# Patient Record
Sex: Male | Born: 1945 | Race: White | Hispanic: No | Smoking: Current every day smoker
Health system: Southern US, Community
[De-identification: ages and names within clinical notes are randomized; demographics above are authoritative.]

## PROBLEM LIST (undated history)

## (undated) DIAGNOSIS — I426 Alcoholic cardiomyopathy: Secondary | ICD-10-CM

## (undated) DIAGNOSIS — I4891 Unspecified atrial fibrillation: Secondary | ICD-10-CM

## (undated) DIAGNOSIS — I2510 Atherosclerotic heart disease of native coronary artery without angina pectoris: Secondary | ICD-10-CM

## (undated) DIAGNOSIS — I10 Essential (primary) hypertension: Secondary | ICD-10-CM

## (undated) DIAGNOSIS — C189 Malignant neoplasm of colon, unspecified: Secondary | ICD-10-CM

## (undated) DIAGNOSIS — R0989 Other specified symptoms and signs involving the circulatory and respiratory systems: Secondary | ICD-10-CM

## (undated) DIAGNOSIS — F32A Depression, unspecified: Secondary | ICD-10-CM

## (undated) DIAGNOSIS — F1010 Alcohol abuse, uncomplicated: Secondary | ICD-10-CM

## (undated) DIAGNOSIS — E785 Hyperlipidemia, unspecified: Secondary | ICD-10-CM

## (undated) DIAGNOSIS — K649 Unspecified hemorrhoids: Secondary | ICD-10-CM

## (undated) DIAGNOSIS — F329 Major depressive disorder, single episode, unspecified: Secondary | ICD-10-CM

## (undated) DIAGNOSIS — G4700 Insomnia, unspecified: Secondary | ICD-10-CM

## (undated) DIAGNOSIS — I4819 Other persistent atrial fibrillation: Secondary | ICD-10-CM

## (undated) DIAGNOSIS — K759 Inflammatory liver disease, unspecified: Secondary | ICD-10-CM

## (undated) DIAGNOSIS — R5383 Other fatigue: Secondary | ICD-10-CM

## (undated) DIAGNOSIS — M25512 Pain in left shoulder: Secondary | ICD-10-CM

## (undated) DIAGNOSIS — F419 Anxiety disorder, unspecified: Secondary | ICD-10-CM

## (undated) DIAGNOSIS — F1011 Alcohol abuse, in remission: Secondary | ICD-10-CM

## (undated) HISTORY — DX: Other persistent atrial fibrillation: I48.19

## (undated) HISTORY — DX: Essential (primary) hypertension: I10

## (undated) HISTORY — PX: CARDIAC CATHETERIZATION: SHX172

## (undated) HISTORY — DX: Anxiety disorder, unspecified: F41.9

## (undated) HISTORY — DX: Pain in left shoulder: M25.512

## (undated) HISTORY — PX: MOUTH SURGERY: SHX715

## (undated) HISTORY — DX: Major depressive disorder, single episode, unspecified: F32.9

## (undated) HISTORY — DX: Depression, unspecified: F32.A

## (undated) HISTORY — DX: Alcohol abuse, in remission: F10.11

## (undated) HISTORY — DX: Insomnia, unspecified: G47.00

## (undated) HISTORY — PX: CORONARY ARTERY BYPASS GRAFT: SHX141

## (undated) HISTORY — DX: Other fatigue: R53.83

## (undated) HISTORY — DX: Unspecified atrial fibrillation: I48.91

## (undated) HISTORY — DX: Hyperlipidemia, unspecified: E78.5

## (undated) HISTORY — DX: Other specified symptoms and signs involving the circulatory and respiratory systems: R09.89

## (undated) HISTORY — DX: Malignant neoplasm of colon, unspecified: C18.9

---

## 2001-07-10 ENCOUNTER — Emergency Department (HOSPITAL_COMMUNITY): Admission: EM | Admit: 2001-07-10 | Discharge: 2001-07-11 | Payer: Self-pay | Admitting: Emergency Medicine

## 2007-04-22 ENCOUNTER — Emergency Department (HOSPITAL_COMMUNITY): Admission: EM | Admit: 2007-04-22 | Discharge: 2007-04-22 | Payer: Self-pay | Admitting: Emergency Medicine

## 2007-04-27 ENCOUNTER — Emergency Department (HOSPITAL_COMMUNITY): Admission: EM | Admit: 2007-04-27 | Discharge: 2007-04-27 | Payer: Self-pay | Admitting: Family Medicine

## 2008-06-15 ENCOUNTER — Inpatient Hospital Stay (HOSPITAL_COMMUNITY): Admission: RE | Admit: 2008-06-15 | Discharge: 2008-06-18 | Payer: Self-pay | Admitting: Psychiatry

## 2008-06-15 ENCOUNTER — Ambulatory Visit: Payer: Self-pay | Admitting: Psychiatry

## 2008-06-15 ENCOUNTER — Emergency Department (HOSPITAL_COMMUNITY): Admission: EM | Admit: 2008-06-15 | Discharge: 2008-06-15 | Payer: Self-pay | Admitting: Emergency Medicine

## 2008-06-25 ENCOUNTER — Inpatient Hospital Stay (HOSPITAL_COMMUNITY): Admission: EM | Admit: 2008-06-25 | Discharge: 2008-06-28 | Payer: Self-pay | Admitting: Emergency Medicine

## 2008-06-27 ENCOUNTER — Encounter (INDEPENDENT_AMBULATORY_CARE_PROVIDER_SITE_OTHER): Payer: Self-pay | Admitting: Internal Medicine

## 2008-08-02 ENCOUNTER — Ambulatory Visit: Payer: Self-pay | Admitting: Internal Medicine

## 2008-08-04 ENCOUNTER — Ambulatory Visit: Payer: Self-pay | Admitting: *Deleted

## 2008-09-07 ENCOUNTER — Ambulatory Visit: Payer: Self-pay | Admitting: Internal Medicine

## 2008-09-09 ENCOUNTER — Ambulatory Visit (HOSPITAL_COMMUNITY): Admission: RE | Admit: 2008-09-09 | Discharge: 2008-09-09 | Payer: Self-pay | Admitting: Internal Medicine

## 2008-09-13 ENCOUNTER — Ambulatory Visit: Payer: Self-pay | Admitting: Internal Medicine

## 2008-09-16 ENCOUNTER — Ambulatory Visit: Payer: Self-pay | Admitting: Internal Medicine

## 2008-09-20 ENCOUNTER — Ambulatory Visit: Payer: Self-pay | Admitting: Internal Medicine

## 2008-09-27 ENCOUNTER — Ambulatory Visit: Payer: Self-pay | Admitting: Internal Medicine

## 2008-10-04 ENCOUNTER — Ambulatory Visit: Payer: Self-pay | Admitting: Internal Medicine

## 2008-10-19 ENCOUNTER — Ambulatory Visit: Payer: Self-pay | Admitting: Internal Medicine

## 2008-11-09 ENCOUNTER — Ambulatory Visit: Payer: Self-pay | Admitting: Internal Medicine

## 2008-12-06 ENCOUNTER — Ambulatory Visit: Payer: Self-pay | Admitting: Cardiology

## 2008-12-06 ENCOUNTER — Ambulatory Visit: Payer: Self-pay | Admitting: Internal Medicine

## 2008-12-06 ENCOUNTER — Emergency Department (HOSPITAL_COMMUNITY): Admission: EM | Admit: 2008-12-06 | Discharge: 2008-12-06 | Payer: Self-pay | Admitting: Family Medicine

## 2008-12-06 ENCOUNTER — Encounter: Payer: Self-pay | Admitting: Internal Medicine

## 2008-12-06 ENCOUNTER — Inpatient Hospital Stay (HOSPITAL_COMMUNITY): Admission: EM | Admit: 2008-12-06 | Discharge: 2008-12-09 | Payer: Self-pay | Admitting: Emergency Medicine

## 2008-12-07 ENCOUNTER — Encounter: Payer: Self-pay | Admitting: Cardiology

## 2008-12-08 ENCOUNTER — Encounter: Payer: Self-pay | Admitting: Cardiology

## 2008-12-08 ENCOUNTER — Encounter: Payer: Self-pay | Admitting: Internal Medicine

## 2009-01-10 ENCOUNTER — Ambulatory Visit: Payer: Self-pay | Admitting: Internal Medicine

## 2009-01-10 LAB — CONVERTED CEMR LAB
BUN: 13 mg/dL (ref 6–23)
Calcium: 9.1 mg/dL (ref 8.4–10.5)
Chloride: 103 meq/L (ref 96–112)
Sodium: 139 meq/L (ref 135–145)

## 2009-01-17 ENCOUNTER — Emergency Department (HOSPITAL_COMMUNITY): Admission: EM | Admit: 2009-01-17 | Discharge: 2009-01-17 | Payer: Self-pay | Admitting: Family Medicine

## 2009-01-17 DIAGNOSIS — F329 Major depressive disorder, single episode, unspecified: Secondary | ICD-10-CM

## 2009-01-17 DIAGNOSIS — R079 Chest pain, unspecified: Secondary | ICD-10-CM | POA: Insufficient documentation

## 2009-01-17 DIAGNOSIS — F3289 Other specified depressive episodes: Secondary | ICD-10-CM | POA: Insufficient documentation

## 2009-01-17 DIAGNOSIS — I4891 Unspecified atrial fibrillation: Secondary | ICD-10-CM | POA: Insufficient documentation

## 2009-01-17 DIAGNOSIS — K649 Unspecified hemorrhoids: Secondary | ICD-10-CM

## 2009-01-17 DIAGNOSIS — B1910 Unspecified viral hepatitis B without hepatic coma: Secondary | ICD-10-CM | POA: Insufficient documentation

## 2009-01-17 HISTORY — DX: Unspecified hemorrhoids: K64.9

## 2009-01-18 ENCOUNTER — Ambulatory Visit: Payer: Self-pay | Admitting: Cardiology

## 2009-01-18 DIAGNOSIS — F17200 Nicotine dependence, unspecified, uncomplicated: Secondary | ICD-10-CM | POA: Insufficient documentation

## 2009-01-18 DIAGNOSIS — F1010 Alcohol abuse, uncomplicated: Secondary | ICD-10-CM | POA: Insufficient documentation

## 2009-01-18 HISTORY — DX: Alcohol abuse, uncomplicated: F10.10

## 2009-02-28 ENCOUNTER — Ambulatory Visit: Payer: Self-pay | Admitting: Internal Medicine

## 2009-03-02 ENCOUNTER — Ambulatory Visit (HOSPITAL_COMMUNITY): Admission: RE | Admit: 2009-03-02 | Discharge: 2009-03-02 | Payer: Self-pay | Admitting: Internal Medicine

## 2009-04-16 ENCOUNTER — Emergency Department (HOSPITAL_COMMUNITY): Admission: EM | Admit: 2009-04-16 | Discharge: 2009-04-16 | Payer: Self-pay | Admitting: Family Medicine

## 2009-04-24 ENCOUNTER — Ambulatory Visit: Payer: Self-pay | Admitting: Internal Medicine

## 2009-04-24 ENCOUNTER — Telehealth (INDEPENDENT_AMBULATORY_CARE_PROVIDER_SITE_OTHER): Payer: Self-pay | Admitting: *Deleted

## 2009-05-31 ENCOUNTER — Ambulatory Visit: Payer: Self-pay | Admitting: Internal Medicine

## 2009-05-31 LAB — CONVERTED CEMR LAB
Sed Rate: 2 mm/hr (ref 0–16)
Testosterone: 665.43 ng/dL (ref 350–890)

## 2009-06-07 ENCOUNTER — Ambulatory Visit: Payer: Self-pay | Admitting: Internal Medicine

## 2009-06-21 ENCOUNTER — Ambulatory Visit: Payer: Self-pay | Admitting: Internal Medicine

## 2009-06-21 ENCOUNTER — Encounter (INDEPENDENT_AMBULATORY_CARE_PROVIDER_SITE_OTHER): Payer: Self-pay | Admitting: Adult Health

## 2009-06-21 LAB — CONVERTED CEMR LAB
Basophils Absolute: 0 10*3/uL (ref 0.0–0.1)
Eosinophils Absolute: 0.2 10*3/uL (ref 0.0–0.7)
Hemoglobin: 16.2 g/dL (ref 13.0–17.0)
Lymphocytes Relative: 30 % (ref 12–46)
Lymphs Abs: 2.4 10*3/uL (ref 0.7–4.0)
MCV: 93.4 fL (ref 78.0–100.0)
Monocytes Absolute: 0.9 10*3/uL (ref 0.1–1.0)
Monocytes Relative: 11 % (ref 3–12)
Platelets: 194 10*3/uL (ref 150–400)
TSH: 1.545 microintl units/mL (ref 0.350–4.500)
WBC: 8 10*3/uL (ref 4.0–10.5)

## 2009-07-18 ENCOUNTER — Ambulatory Visit: Payer: Self-pay | Admitting: Cardiology

## 2009-08-21 ENCOUNTER — Ambulatory Visit: Payer: Self-pay | Admitting: Internal Medicine

## 2009-10-02 ENCOUNTER — Ambulatory Visit: Payer: Self-pay | Admitting: Family Medicine

## 2009-10-02 LAB — CONVERTED CEMR LAB
Basophils Absolute: 0 10*3/uL (ref 0.0–0.1)
CRP: 0.2 mg/dL (ref <0.6)
Calcium: 9.3 mg/dL (ref 8.4–10.5)
Eosinophils Absolute: 0.1 10*3/uL (ref 0.0–0.7)
Hemoglobin: 15.7 g/dL (ref 13.0–17.0)
MCHC: 33.6 g/dL (ref 30.0–36.0)
Monocytes Absolute: 0.7 10*3/uL (ref 0.1–1.0)
Platelets: 197 10*3/uL (ref 150–400)
Potassium: 5.2 meq/L (ref 3.5–5.3)
RBC: 5.06 M/uL (ref 4.22–5.81)
Total Bilirubin: 0.5 mg/dL (ref 0.3–1.2)
Total Protein: 7.1 g/dL (ref 6.0–8.3)

## 2009-10-04 ENCOUNTER — Ambulatory Visit (HOSPITAL_COMMUNITY): Admission: RE | Admit: 2009-10-04 | Discharge: 2009-10-04 | Payer: Self-pay | Admitting: Family Medicine

## 2009-10-31 ENCOUNTER — Emergency Department (HOSPITAL_COMMUNITY): Admission: EM | Admit: 2009-10-31 | Discharge: 2009-11-01 | Payer: Self-pay | Admitting: Emergency Medicine

## 2009-11-01 ENCOUNTER — Emergency Department (HOSPITAL_COMMUNITY): Admission: EM | Admit: 2009-11-01 | Discharge: 2009-11-01 | Payer: Self-pay | Admitting: Emergency Medicine

## 2009-11-03 ENCOUNTER — Emergency Department (HOSPITAL_COMMUNITY): Admission: EM | Admit: 2009-11-03 | Discharge: 2009-11-04 | Payer: Self-pay | Admitting: Emergency Medicine

## 2009-11-04 ENCOUNTER — Inpatient Hospital Stay (HOSPITAL_COMMUNITY): Admission: AD | Admit: 2009-11-04 | Discharge: 2009-11-05 | Payer: Self-pay | Admitting: Psychiatry

## 2009-11-05 ENCOUNTER — Ambulatory Visit: Payer: Self-pay | Admitting: Psychiatry

## 2009-12-02 ENCOUNTER — Emergency Department (HOSPITAL_COMMUNITY): Admission: EM | Admit: 2009-12-02 | Discharge: 2009-12-02 | Payer: Self-pay | Admitting: Emergency Medicine

## 2009-12-02 ENCOUNTER — Emergency Department (HOSPITAL_COMMUNITY): Admission: EM | Admit: 2009-12-02 | Discharge: 2009-12-03 | Payer: Self-pay | Admitting: Emergency Medicine

## 2009-12-02 ENCOUNTER — Ambulatory Visit: Payer: Self-pay | Admitting: Psychiatry

## 2010-02-15 ENCOUNTER — Inpatient Hospital Stay (HOSPITAL_COMMUNITY): Admission: RE | Admit: 2010-02-15 | Discharge: 2009-12-06 | Payer: Self-pay | Admitting: Psychiatry

## 2010-04-12 NOTE — Assessment & Plan Note (Signed)
Summary: F6M/DM   Visit Type:  Follow-up Primary Provider:  Dr Reche Dixon ([facility])  CC:  no complaints.  History of Present Illness: Pleasant gentleman  admitted to [facility] with atrial fibrillation and chest pain in September of 2010. He ruled out for myocardial infarction with serial enzymes. A Myoview showed inferior thinning but no ischemia and an ejection fraction of 61%. An echocardiogram in April showed normal LV function and mild right atrial/right ventricular enlargement. A TSH was normal. It was felt that he was not a Coumadin candidate given her history of EtOH abuse. He was discharged on rate control medications. I last saw him in November of 2010. Since then the patient denies any dyspnea on exertion, orthopnea, PND, pedal edema, palpitations, syncope or chest pain.   Current Medications (verified): 1)  Trazodone Hcl 100 Mg Tabs (Trazodone Hcl) .... Take One Tablet At Bedtime 2)  Aspirin 325 Mg Tabs (Aspirin) .... Take 1 Tablet By Mouth Once A Day 3)  Diltiazem Hcl Er Beads 360 Mg Xr24h-Cap (Diltiazem Hcl Er Beads) .... Take 1 Tablet By Mouth Once A Day 4)  Lanoxin 0.25 Mg Tabs (Digoxin) .... Take 1 Tablet Once Daily By Mouth  Allergies (verified): No Known Drug Allergies  Past History:  Past Medical History: DEPRESSION (ICD-311) HEMORRHOIDS (ICD-455.6) HEPATITIS B (ICD-070.30) ATRIAL FIBRILLATION (ICD-427.31) History of alcohol abuse  Social History: Reviewed history from 01/18/2009 and no changes required.  He lives in [place] with his wife and daughter.  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   He has 67 pack year history tobacco. History of EtOH abuse   Review of Systems       no fevers or chills, productive cough, hemoptysis, dysphasia, odynophagia, melena, hematochezia, dysuria, hematuria, rash, seizure activity, orthopnea, PND, pedal edema, claudication. Remaining systems are negative.   Vital  Signs:  Patient profile:   65 year old male Height:      78 inches Weight:      202 pounds BMI:     23.43 Pulse rate:   62 / minute BP sitting:   115 / 68  (left arm) Cuff size:   large  Vitals Entered By: Burnett Kanaris, CNA (Jul 18, 2009 8:26 AM)  Physical Exam  General:  Well-developed well-nourished in no acute distress.  Skin is warm and dry.  HEENT is normal.  Neck is supple. No thyromegaly.  Chest is clear to auscultation with normal expansion.  Cardiovascular exam is regular rate and rhythm.  Abdominal exam nontender or distended. No masses palpated. Extremities show no edema. neuro grossly intact    EKG  Procedure date:  07/18/2009  Findings:      Sinus rhythm at a rate of 61. Cannot rule out prior inferior infarct.  Impression & Recommendations:  Problem # 1:  ATRIAL FIBRILLATION (ICD-427.31) Patient remains in sinus rhythm. Continue Cardizem and Lanoxin. Continue aspirin. Patient has no embolic risk factors. Given history of EtOH he would not be a Coumadin candidate regardless. His updated medication list for this problem includes:    Aspirin 325 Mg Tabs (Aspirin) .Marland Kitchen... Take 1 tablet by mouth once a day    Lanoxin 0.25 Mg Tabs (Digoxin) .Marland Kitchen... Take 1 tablet once daily by mouth  Orders: EKG w/ Interpretation (93000)  Problem # 2:  TOBACCO ABUSE (ICD-305.1) Patient counseled on discontinuing.  Problem # 3:  ALCOHOL ABUSE (ICD-305.00) Patient counseled on avoiding.  Problem # 4:  HEPATITIS B (ICD-070.30) Management per  primary care.  Patient Instructions: 1)  Your physician recommends that you schedule a follow-up appointment in: 1 YEAR WITH DR. Bobette Leyh 2)  Your physician recommends that you continue on your current medications as directed. Please refer to the Current Medication list given to you today.

## 2010-04-12 NOTE — Progress Notes (Signed)
Summary: triage/hypotension/bradycardia/low temp.  Phone Note Call from Patient   Caller: Patient Reason for Call: Talk to Nurse Summary of Call: Patient states his temp was 95.7 this am...and has been around the same for a few days. His BP at home is 96/57 and pulse has been in the low 50's.Marland KitchenMarland KitchenHe was at seen at [facility] on 2/26 and has a history of Afib.Marland KitchenETOH abuse and Hep B..He states he feels very tired.Marland KitchenMarland KitchenHe was at [facility] for [facility]ough on 04/16/09.Marland KitchenMarland KitchenAppointment made for today..in open schedule... Initial call taken by: Conchita Paris,  April 24, 2009 10:32 AM

## 2010-05-24 LAB — RAPID URINE DRUG SCREEN, HOSP PERFORMED
Barbiturates: NOT DETECTED
Benzodiazepines: NOT DETECTED

## 2010-05-24 LAB — TROPONIN I: Troponin I: 0.01 ng/mL (ref 0.00–0.06)

## 2010-05-24 LAB — COMPREHENSIVE METABOLIC PANEL
ALT: 17 U/L (ref 0–53)
AST: 25 U/L (ref 0–37)
Albumin: 3.3 g/dL — ABNORMAL LOW (ref 3.5–5.2)
Alkaline Phosphatase: 63 U/L (ref 39–117)
BUN: 4 mg/dL — ABNORMAL LOW (ref 6–23)
CO2: 27 mEq/L (ref 19–32)
Chloride: 100 mEq/L (ref 96–112)
Creatinine, Ser: 0.77 mg/dL (ref 0.4–1.5)
GFR calc Af Amer: >60 mL/min (ref >60)
GFR calc non Af Amer: >60 mL/min (ref >60)
Glucose, Bld: 162 mg/dL — ABNORMAL HIGH (ref 70–99)
Potassium: 4.6 mEq/L (ref 3.5–5.1)
Sodium: 132 mEq/L — ABNORMAL LOW (ref 135–145)
Total Bilirubin: 0.4 mg/dL (ref 0.3–1.2)

## 2010-05-24 LAB — DIFFERENTIAL
Basophils Absolute: 0 10*3/uL (ref 0.0–0.1)
Basophils Relative: 0 % (ref 0–1)
Eosinophils Relative: 2 % (ref 0–5)
Monocytes Absolute: 0.9 10*3/uL (ref 0.1–1.0)
Monocytes Relative: 12 % (ref 3–12)
Neutro Abs: 3.8 10*3/uL (ref 1.7–7.7)

## 2010-05-24 LAB — BASIC METABOLIC PANEL
BUN: 5 mg/dL — ABNORMAL LOW (ref 6–23)
CO2: 24 mEq/L (ref 19–32)
GFR calc non Af Amer: >60 mL/min (ref >60)
Glucose, Bld: 88 mg/dL (ref 70–99)
Potassium: 4.2 mEq/L (ref 3.5–5.1)

## 2010-05-24 LAB — ETHANOL: Alcohol, Ethyl (B): 112 mg/dL — ABNORMAL HIGH (ref 0–10)

## 2010-05-24 LAB — DIGOXIN LEVEL: Digoxin Level: <0.2 ng/mL — ABNORMAL LOW (ref 0.8–2.0)

## 2010-05-24 LAB — CBC
HCT: 44.6 % (ref 39.0–52.0)
HCT: 45.2 % (ref 39.0–52.0)
Platelets: 181 10*3/uL (ref 150–400)
RBC: 4.86 MIL/uL (ref 4.22–5.81)

## 2010-05-24 LAB — CK TOTAL AND CKMB (NOT AT ARMC): CK, MB: 0.8 ng/mL (ref 0.3–4.0)

## 2010-05-25 LAB — ETHANOL: Alcohol, Ethyl (B): 96 mg/dL — ABNORMAL HIGH (ref 0–10)

## 2010-05-25 LAB — DIFFERENTIAL
Basophils Absolute: 0.1 10*3/uL (ref 0.0–0.1)
Basophils Relative: 1 % (ref 0–1)
Basophils Relative: 1 % (ref 0–1)
Eosinophils Absolute: 0 10*3/uL (ref 0.0–0.7)
Eosinophils Absolute: 0.1 10*3/uL (ref 0.0–0.7)
Eosinophils Relative: 0 % (ref 0–5)
Monocytes Absolute: 0.8 10*3/uL (ref 0.1–1.0)
Monocytes Relative: 7 % (ref 3–12)
Monocytes Relative: 7 % (ref 3–12)
Neutro Abs: 5.6 10*3/uL (ref 1.7–7.7)
Neutrophils Relative %: 59 % (ref 43–77)

## 2010-05-25 LAB — COMPREHENSIVE METABOLIC PANEL
ALT: 11 U/L (ref 0–53)
Alkaline Phosphatase: 86 U/L (ref 39–117)
BUN: 7 mg/dL (ref 6–23)
CO2: 20 mEq/L (ref 19–32)
Chloride: 101 mEq/L (ref 96–112)
GFR calc non Af Amer: >60 mL/min (ref >60)
Glucose, Bld: 79 mg/dL (ref 70–99)
Potassium: 3.4 mEq/L — ABNORMAL LOW (ref 3.5–5.1)
Sodium: 132 mEq/L — ABNORMAL LOW (ref 135–145)
Total Bilirubin: 0.7 mg/dL (ref 0.3–1.2)

## 2010-05-25 LAB — RAPID URINE DRUG SCREEN, HOSP PERFORMED
Amphetamines: NOT DETECTED
Amphetamines: NOT DETECTED
Amphetamines: NOT DETECTED
Barbiturates: NOT DETECTED
Barbiturates: NOT DETECTED
Benzodiazepines: NOT DETECTED
Cocaine: NOT DETECTED
Cocaine: NOT DETECTED
Opiates: NOT DETECTED

## 2010-05-25 LAB — BASIC METABOLIC PANEL
CO2: 25 mEq/L (ref 19–32)
Calcium: 9.2 mg/dL (ref 8.4–10.5)
Glucose, Bld: 85 mg/dL (ref 70–99)
Potassium: 4 mEq/L (ref 3.5–5.1)
Sodium: 134 mEq/L — ABNORMAL LOW (ref 135–145)

## 2010-05-25 LAB — HEPATIC FUNCTION PANEL
Bilirubin, Direct: 0.2 mg/dL (ref 0.0–0.3)
Total Bilirubin: 0.7 mg/dL (ref 0.3–1.2)

## 2010-05-25 LAB — DIGOXIN LEVEL
Digoxin Level: 0.3 ng/mL — ABNORMAL LOW (ref 0.8–2.0)
Digoxin Level: 1.9 ng/mL (ref 0.8–2.0)

## 2010-05-25 LAB — URINALYSIS, ROUTINE W REFLEX MICROSCOPIC
Bilirubin Urine: NEGATIVE
Hgb urine dipstick: NEGATIVE
Ketones, ur: NEGATIVE mg/dL
Nitrite: NEGATIVE
Specific Gravity, Urine: 1.015 (ref 1.005–1.030)
pH: 7.5 (ref 5.0–8.0)

## 2010-05-25 LAB — CBC
HCT: 42.1 % (ref 39.0–52.0)
HCT: 46.6 % (ref 39.0–52.0)
Hemoglobin: 14.7 g/dL (ref 13.0–17.0)
Hemoglobin: 16.2 g/dL (ref 13.0–17.0)
MCH: 32.1 pg (ref 26.0–34.0)
MCHC: 34.8 g/dL (ref 30.0–36.0)
MCV: 91.7 fL (ref 78.0–100.0)
WBC: 9.6 10*3/uL (ref 4.0–10.5)

## 2010-05-25 LAB — POCT CARDIAC MARKERS
Troponin i, poc: <0.05 ng/mL (ref 0.00–0.09)
Troponin i, poc: <0.05 ng/mL (ref 0.00–0.09)

## 2010-05-25 LAB — GLUCOSE, CAPILLARY: Glucose-Capillary: 97 mg/dL (ref 70–99)

## 2010-06-13 LAB — POCT URINALYSIS DIP (DEVICE)
Glucose, UA: NEGATIVE mg/dL
Ketones, ur: NEGATIVE mg/dL
Nitrite: NEGATIVE
Specific Gravity, Urine: >=1.03 (ref 1.005–1.030)

## 2010-06-14 LAB — BASIC METABOLIC PANEL
BUN: 11 mg/dL (ref 6–23)
Chloride: 104 mEq/L (ref 96–112)
Creatinine, Ser: 1.01 mg/dL (ref 0.4–1.5)
GFR calc non Af Amer: >60 mL/min (ref >60)
Glucose, Bld: 102 mg/dL — ABNORMAL HIGH (ref 70–99)
Potassium: 4 mEq/L (ref 3.5–5.1)

## 2010-06-14 LAB — CBC
HCT: 42.3 % (ref 39.0–52.0)
MCV: 93.1 fL (ref 78.0–100.0)
Platelets: 128 10*3/uL — ABNORMAL LOW (ref 150–400)
RDW: 13.3 % (ref 11.5–15.5)
WBC: 9.2 10*3/uL (ref 4.0–10.5)

## 2010-06-15 LAB — CBC
Hemoglobin: 16 g/dL (ref 13.0–17.0)
Hemoglobin: 17.5 g/dL — ABNORMAL HIGH (ref 13.0–17.0)
MCHC: 34 g/dL (ref 30.0–36.0)
MCHC: 34.4 g/dL (ref 30.0–36.0)
MCV: 92.5 fL (ref 78.0–100.0)
Platelets: 146 10*3/uL — ABNORMAL LOW (ref 150–400)
Platelets: 149 10*3/uL — ABNORMAL LOW (ref 150–400)
RBC: 5.56 MIL/uL (ref 4.22–5.81)
RDW: 13.1 % (ref 11.5–15.5)
RDW: 13.6 % (ref 11.5–15.5)

## 2010-06-15 LAB — CARDIAC PANEL(CRET KIN+CKTOT+MB+TROPI)
CK, MB: 0.8 ng/mL (ref 0.3–4.0)
Relative Index: INVALID (ref 0.0–2.5)
Relative Index: INVALID (ref 0.0–2.5)
Total CK: 69 U/L (ref 7–232)
Total CK: 70 U/L (ref 7–232)
Troponin I: 0.02 ng/mL (ref 0.00–0.06)

## 2010-06-15 LAB — BASIC METABOLIC PANEL
BUN: 10 mg/dL (ref 6–23)
CO2: 24 mEq/L (ref 19–32)
CO2: 26 mEq/L (ref 19–32)
CO2: 28 mEq/L (ref 19–32)
Calcium: 8.4 mg/dL (ref 8.4–10.5)
Chloride: 101 mEq/L (ref 96–112)
Creatinine, Ser: 1.02 mg/dL (ref 0.4–1.5)
GFR calc Af Amer: >60 mL/min (ref >60)
GFR calc Af Amer: >60 mL/min (ref >60)
GFR calc non Af Amer: >60 mL/min (ref >60)
GFR calc non Af Amer: >60 mL/min (ref >60)
Glucose, Bld: 102 mg/dL — ABNORMAL HIGH (ref 70–99)
Glucose, Bld: 109 mg/dL — ABNORMAL HIGH (ref 70–99)
Potassium: 3.7 mEq/L (ref 3.5–5.1)
Potassium: 4.4 mEq/L (ref 3.5–5.1)
Sodium: 135 mEq/L (ref 135–145)
Sodium: 136 mEq/L (ref 135–145)
Sodium: 136 mEq/L (ref 135–145)

## 2010-06-15 LAB — DIFFERENTIAL
Basophils Absolute: 0 10*3/uL (ref 0.0–0.1)
Basophils Relative: 0 % (ref 0–1)
Lymphocytes Relative: 40 % (ref 12–46)
Neutro Abs: 3.8 10*3/uL (ref 1.7–7.7)
Neutrophils Relative %: 47 % (ref 43–77)

## 2010-06-15 LAB — TROPONIN I: Troponin I: 0.02 ng/mL (ref 0.00–0.06)

## 2010-06-15 LAB — BRAIN NATRIURETIC PEPTIDE: Pro B Natriuretic peptide (BNP): 108 pg/mL — ABNORMAL HIGH (ref 0.0–100.0)

## 2010-06-15 LAB — CK TOTAL AND CKMB (NOT AT ARMC)
CK, MB: 1 ng/mL (ref 0.3–4.0)
Total CK: 68 U/L (ref 7–232)

## 2010-06-15 LAB — POCT CARDIAC MARKERS: Troponin i, poc: <0.05 ng/mL (ref 0.00–0.09)

## 2010-06-15 LAB — ETHANOL: Alcohol, Ethyl (B): 6 mg/dL (ref 0–10)

## 2010-06-20 LAB — COMPREHENSIVE METABOLIC PANEL
ALT: 12 U/L (ref 0–53)
Alkaline Phosphatase: 77 U/L (ref 39–117)
CO2: 22 mEq/L (ref 19–32)
Chloride: 105 mEq/L (ref 96–112)
GFR calc non Af Amer: >60 mL/min (ref >60)
Glucose, Bld: 88 mg/dL (ref 70–99)
Potassium: 3.6 mEq/L (ref 3.5–5.1)
Sodium: 138 mEq/L (ref 135–145)

## 2010-06-20 LAB — POCT CARDIAC MARKERS: Troponin i, poc: <0.05 ng/mL (ref 0.00–0.09)

## 2010-06-20 LAB — CARDIAC PANEL(CRET KIN+CKTOT+MB+TROPI)
CK, MB: 0.7 ng/mL (ref 0.3–4.0)
CK, MB: 1 ng/mL (ref 0.3–4.0)
Total CK: 78 U/L (ref 7–232)

## 2010-06-20 LAB — DIGOXIN LEVEL: Digoxin Level: 0.6 ng/mL — ABNORMAL LOW (ref 0.8–2.0)

## 2010-06-20 LAB — CBC
HCT: 39.3 % (ref 39.0–52.0)
HCT: 42.2 % (ref 39.0–52.0)
HCT: 43 % (ref 39.0–52.0)
Hemoglobin: 14.4 g/dL (ref 13.0–17.0)
Hemoglobin: 14.7 g/dL (ref 13.0–17.0)
MCV: 93.9 fL (ref 78.0–100.0)
Platelets: 119 10*3/uL — ABNORMAL LOW (ref 150–400)
Platelets: 152 10*3/uL (ref 150–400)
RBC: 4.17 MIL/uL — ABNORMAL LOW (ref 4.22–5.81)
RDW: 14.3 % (ref 11.5–15.5)
WBC: 5.4 10*3/uL (ref 4.0–10.5)
WBC: 7.1 10*3/uL (ref 4.0–10.5)
WBC: 9.3 10*3/uL (ref 4.0–10.5)

## 2010-06-20 LAB — BASIC METABOLIC PANEL
BUN: 7 mg/dL (ref 6–23)
BUN: 7 mg/dL (ref 6–23)
Chloride: 99 mEq/L (ref 96–112)
Creatinine, Ser: 0.85 mg/dL (ref 0.4–1.5)
GFR calc Af Amer: >60 mL/min (ref >60)
GFR calc non Af Amer: >60 mL/min (ref >60)
GFR calc non Af Amer: >60 mL/min (ref >60)
GFR calc non Af Amer: >60 mL/min (ref >60)
Glucose, Bld: 82 mg/dL (ref 70–99)
Potassium: 3.7 mEq/L (ref 3.5–5.1)
Potassium: 4.1 mEq/L (ref 3.5–5.1)
Potassium: 4.2 mEq/L (ref 3.5–5.1)
Sodium: 132 mEq/L — ABNORMAL LOW (ref 135–145)
Sodium: 138 mEq/L (ref 135–145)

## 2010-06-20 LAB — TSH: TSH: 3.316 u[IU]/mL (ref 0.350–4.500)

## 2010-06-20 LAB — PROTIME-INR
INR: 1.2 (ref 0.00–1.49)
Prothrombin Time: 15.4 seconds — ABNORMAL HIGH (ref 11.6–15.2)

## 2010-06-20 LAB — DIFFERENTIAL
Eosinophils Absolute: 0.1 10*3/uL (ref 0.0–0.7)
Eosinophils Relative: 1 % (ref 0–5)
Lymphocytes Relative: 51 % — ABNORMAL HIGH (ref 12–46)
Lymphs Abs: 4.8 10*3/uL — ABNORMAL HIGH (ref 0.7–4.0)
Monocytes Absolute: 0.7 10*3/uL (ref 0.1–1.0)

## 2010-06-20 LAB — RAPID URINE DRUG SCREEN, HOSP PERFORMED
Amphetamines: NOT DETECTED
Barbiturates: NOT DETECTED
Benzodiazepines: POSITIVE — AB
Cocaine: NOT DETECTED
Opiates: NOT DETECTED
Tetrahydrocannabinol: NOT DETECTED

## 2010-06-20 LAB — CK TOTAL AND CKMB (NOT AT ARMC)
CK, MB: 1.2 ng/mL (ref 0.3–4.0)
Relative Index: INVALID (ref 0.0–2.5)

## 2010-06-20 LAB — APTT: aPTT: 39 seconds — ABNORMAL HIGH (ref 24–37)

## 2010-07-24 NOTE — H&P (Signed)
NAME:  [name], [name] NO.:  [id number]   MEDICAL RECORD NO.:  [id number]          PATIENT TYPE:  IPS   LOCATION:  0501                          [facility]ACILITY[facility]  [facility]   PHYSICIAN:  Jasmine Pang, M.D. DATE OF BIRTH:  1945/12/31   DATE OF ADMISSION:  06/15/2008  DATE OF DISCHARGE:                       PSYCHIATRIC ADMISSION ASSESSMENT   This is a 65 year old male voluntarily admitted on June 15, 2008.   HISTORY OF PRESENT ILLNESS:  Patient presents with a history of alcohol  use stating that he began drinking heavily.  His drinking has been  escalating over the past couple weeks.  His last drink was on day prior  to this admission, drinking beer up to 12 or more.  He has been wanting  help, having problems with his wife.  He feels depressed but denies any  suicidal thoughts.  Reports he has not been sleeping for the past 4 to 5  days and his appetite has been satisfactory.   PAST PSYCHIATRIC HISTORY:  First admission to [facility].  Has been to detox programs before when [facility].   SOCIAL HISTORY:  A 66 year old male who is divorced.  He lives in  [place].  Has been living with his ex-wife but now with his drinking  has been living currently in as hotel.  Has a DUI in 2000 and states he  does have some charges with keeping a vehicle longer than he should and  is currently unemployed.   FAMILY HISTORY:  Father with alcohol problems.   ALCOHOL AND DRUG HISTORY:  Patient smokes.  Denies any blackouts or  seizure activity.  Denies any recreational drug use.   PRIMARY CARE Krystle Polcyn:  None.   MEDICAL PROBLEMS:  Denies any acute or chronic health issues.   MEDICATIONS:  None prior to arrival.   DRUG ALLERGIES:  NO KNOWN DRUG ALLERGIES.   PHYSICAL EXAM:  This is a tall, disheveled, middle-aged male with no  tremors and in no acute distress.  He was fully assessed at [facility]  Emergency  Department.  Physical exam was reviewed with no significant  findings.  Temperature of 97.7, 110 heart rate, 18 respirations, blood  pressure is 117/88, 6 feet 6 inches tall, 214 pounds.   LABORATORY DATA:  Shows an alcohol level of 194.  CMET within normal  limits.  Chest x-ray shows no acute changes.   MENTAL STATUS EXAM:  He is fully alert and cooperative with fair eye  contact, casually dressed.  Speech is soft spoken.  Mood is depressed.  Patient also has his eyes down.  He appears flat and depressed.  Thought  processes are coherent and goal directed.  No evidence of any psychotic  statements.  Cognitive function intact.  Memory appears intact.  Judgment and insight are good.   AXIS I:  1. Alcohol dependence.  2. Substance-induced mood disorder.  AXIS II:  Deferred.  AXIS III:  No known medical conditions.  AXIS IV:  Problems with primary support group, possible problems with  housing, and  other psychosocial problems, problems related to legal  system.  AXIS V:  Current is 45.   PLAN:  Contract for safety.  We will have Librium available on a p.r.n.  basis.  We will monitor signs and symptoms of withdrawal, work on  relapse prevention.  Patient may benefit from an antidepressant.  We  will continue to assess other comorbidities, offer a family session with  his wife, and case manager will assess his living situation.   TENTATIVE LENGTH OF STAY:  Three to 5 days.      Landry Corporal, N.P.      Jasmine Pang, M.D.  Electronically Signed    JO/MEDQ  D:  06/17/2008  T:  06/17/2008  Job:  [id number]

## 2010-07-24 NOTE — H&P (Signed)
NAME:  [name], [name] NO.:  [id number]   MEDICAL RECORD NO.:  [id number]          PATIENT TYPE:  INP   LOCATION:  1237                         FACILITY:  [facility]   PHYSICIAN:  Della Goo, M.D. DATE OF BIRTH:  [birth year]   DATE OF ADMISSION:  06/25/2008  DATE OF DISCHARGE:                              HISTORY & PHYSICAL   PRIMARY CARE PHYSICIAN:  None.   CHIEF COMPLAINT:  Wishes to go to alcohol detox program.   HISTORY OF PRESENT ILLNESS:  This is a 65 year old male who presents to  the emergency department with complaints of drinking too much and  desiring to go to an alcohol detox program.  The patient states that he  drinks a lot.  He states that he drank more than a 12-pack prior to  coming in this evening.  His alcohol level was found to be 274.  The  patient was referred for medical admission secondary to complaints of  having shortness of breath and chest pain as well.  He states that he  has had chest pain off and on in the right lateral side of his chest for  the past month.  He also reports having shortness of breath.  He denies  having any nausea, vomiting or diaphoresis associated with this  discomfort.  He denies that this discomfort is related to exertion.  He  states it happens on occasion.  The patient is a smoker as well.  He  states he smokes one and a half packs of cigarettes daily for 45 years,  and he denies having any family history of heart disease that he knows  of.   In the emergency department, the patient was evaluated and found to be  in atrial fibrillation which appeared to be rate controlled.  The  patient was asked further if he had been having palpitations and had  noticed that his heart would race off and on, and he stated that he had,  and this has been going on for many months.   PAST MEDICAL HISTORY:  The patient denies any previous medical problems.   PAST SURGICAL HISTORY:  None.   MEDICATIONS:  None.   ALLERGIES:  NO KNOWN DRUG ALLERGIES.   SOCIAL HISTORY:  The patient is a smoker, smokes one and a half packs of  cigarettes daily for 45 years.  He also is a heavy drinker and would not  state how long he had been drinking heavily.   FAMILY HISTORY:  Negative for coronary artery disease, hypertension,  diabetes and cancer.   REVIEW OF SYSTEMS:  Pertinents are mentioned above in HPI.  All other  systems are negative.  The patient denies having syncope or seizures.   PHYSICAL EXAMINATION FINDINGS:  GENERAL:  This is a 65 year old  pleasant, mildly intoxicated male in discomfort but no acute distress.  VITAL SIGNS:  Temperature 97.9, blood pressure 107/70, heart rate 80,  respirations 20, O2 saturations 100% on 2 liters nasal cannula oxygen.  HEENT:  Normocephalic, atraumatic.  Pupils equally round reactive to  light.  Extraocular movements are intact.  Funduscopic  benign.  Oropharynx is clear.  NECK:  Supple, full range of motion.  No thyromegaly, adenopathy,  jugular venous distention.  CARDIOVASCULAR:  Regular rate and rhythm.  No murmurs, gallops or rubs.  LUNGS:  Clear to auscultation bilaterally.  ABDOMEN:  Positive bowel sounds, soft, nontender, nondistended.  EXTREMITIES: Without cyanosis, clubbing or edema.  NEUROLOGIC:  The patient is alert and oriented x3.  Cranial nerves are  intact.  Motor and sensory function also intact.  There are no focal  deficits on examination.   LABORATORY STUDIES:  White blood cell count 9.3, hemoglobin 14.7,  hematocrit 43.0, platelets 152, neutrophils 39%, lymphocytes 51%,  alcohol level 274.  Sodium 132, potassium 4.1, chloride 99, carbon  dioxide 25, BUN 7, creatinine 0.92 and glucose 82.  Urine drug screen  positive for benzodiazepines.  Point of care cardiac markers with a  myoglobin of 65.7, CK-MB less than 1.0, troponin less than 0.05.  EKG  reveals an atrial fibrillation initially with a rate of 102 with mild  rapid ventricular  response.  The patient received a bolus of Cardizem  times one dose and the heart rate did decrease to 86.  The patient also  became hypotensive at that time and received volume resuscitation.  A CT  scan of the chest with angiogram was performed, results of which were  negative for evidence of pulmonary emboli.  Mild cardiomegaly and  coronary artery disease changes were present, and emphysema changes were  seen.   ASSESSMENT:  A 65 year old male being admitted with:  1. Atrial fibrillation with episodes of RVR.  2. Hypotension secondary to medication.  3. Chest pain and shortness of breath.  4. Alcohol intoxication.  5. Tobacco abuse.  6. Alcohol abuse.   PLAN:  The patient will be admitted to a step-down ICU area.  The  patient will be placed on digoxin therapy to help rate control his  atrial fibrillation.  Cardiac enzymes will be ordered as well and a TSH  level.  The patient will also be placed on the IV Ativan protocol for  alcohol withdrawal.  Volume resuscitation has been ordered.  A nicotine  patch has also been ordered for tobacco withdrawal.  The patient will be  placed on GI prophylaxis.  Further workup will ensue pending results of  the patient's clinical studies and his clinical course.  Once the  patient is medically cleared alcohol detox and Behavioral Health  consultations will be considered.      Della Goo, M.D.  Electronically Signed     HJ/MEDQ  D:  06/25/2008  T:  06/26/2008  Job:  [id number]

## 2010-07-24 NOTE — Consult Note (Signed)
NAME:  [name] NO.:  [id number]   MEDICAL RECORD NO.:  [id number]          PATIENT TYPE:  INP   LOCATION:  1403                         [facility]ACILITY:  [facility]   PHYSICIAN:  Antonietta Breach, M.D.  DATE OF BIRTH:  09/04/1945   DATE OF CONSULTATION:  06/28/2008  DATE OF DISCHARGE:  06/28/2008                                 CONSULTATION   REQUESTING PHYSICIAN:  [facility].   REASON FOR CONSULTATION:  Alcohol abuse.   HISTORY OF PRESENT ILLNESS:  [name] is a 65 year old male  admitted to the [facility] on June 24, 2008 due to atrial  fibrillation and alcohol abuse.  He does state that he was initially  having depressed mood over a breakup with his ex-wife; they had gotten  back to gather and then had broken up once again.   However, after spending time in the hospital he has resolved about their  break-up.  He does have constructive future goals and interests.  He has  no thoughts of harming himself or others.   His orientation and memory function are intact.  He does not have any  hallucinations or delusions.  He is socially appropriate and  cooperative.   He has been drinking alcohol heavily as an outpatient.   PAST PSYCHIATRIC HISTORY:  [name] has been drinking heavily for  years.  He was drinking a 12-pack of beer per day for a 2-week period;  however, this was interrupted with a recent admission to [facility] for 3 days.  He then relapsed approximately one week  ago, and drank 12 beers each day at that point.   When he was read[facility] he was placed on and  Ativan withdrawal protocol, which is almost currently finished   He has no history of suicide attempts.  He has no history of elevated  mood, elevated energy or decreased need for sleep.  He has no other  psychiatric admissions.   FAMILY PSYCHIATRIC HISTORY:  None.   SOCIAL HISTORY:  Divorced.  Religion:  [employer].   PAST MEDICAL HISTORY:  Atrial fibrillation.   Medications, Allergies and laboratory data reviewed.   REVIEW OF SYSTEMS:  Noncontributory.   MENTAL STATUS EXAM:  [name] is alert.  He is oriented to all spheres.  His eye contact is good.  His attention span is normal.  His  concentration is normal.  His affect is broad and appropriate.  Mood is  within normal limits.  His memory is intact to immediate, recent and  remote.  Fund of knowledge and intelligence are within normal limits.  Speech involves normal rate and prosody, without dysarthria.  Thought  process is logical, coherent and goal-directed.  No looseness of  associations.  Thought content:  No thoughts of harming himself or  others.  No delusions or hallucinations.  Insight is intact.  Judgment  is intact.   ASSESSMENT:  AXIS I:  Alcohol dependence with physiologic dependence,  now resolved.  AXIS II:  Deferred.  AXIS III:  See past medical history.  AXIS IV:  Economic.  General medical.  AXIS V:  55.   [name] is not at risk to harm himself or others.  He agrees to call  emergency services immediately for any thoughts of harming himself,  thoughts of harming others or distress.   The undersigned provided ego supportive psychotherapy and education.   RECOMMENDATIONS:  1. Would ask the social worker to help [name] find a shelter.  2. Would also provide information and appointment to [facility] for      substance rehabilitation.  3.  Would also recommend 12-step groups      and contact with sponsor.      Antonietta Breach, M.D.  Electronically Signed     JW/MEDQ  D:  07/10/2008  T:  07/10/2008  Job:  [id number]

## 2010-07-24 NOTE — Discharge Summary (Signed)
NAME:  [name], [name] NO.:  [id number]   MEDICAL RECORD NO.:  [id number]          PATIENT TYPE:  INP   LOCATION:  1403                         FACILITY:  [facility]   PHYSICIAN:  Eduard Clos, MDDATE OF BIRTH:  05-20-1945   DATE OF ADMISSION:  06/24/2008  DATE OF DISCHARGE:  06/28/2008                               DISCHARGE SUMMARY   PRIMARY CARE PHYSICIAN:  Unassigned, but the patient is supposed to  follow with [facility] with Dr. Reche Dixon.   HOSPITAL COURSE:  A 65 year old male with history of chronic alcoholism,  was found to be in atrial fibrillation with rapid ventricular rate.  Was  admitted to ICU and started on digoxin as patient was mildly  hypotensive.  Eventually he was started on IV amiodarone.  He also was  complaining of some chest pain, but with heart rate , his chest pain had  resolved.  Cardiac enzymes were negative.  Amiodarone IV and digoxin  were discontinued.  The patient was placed on Cardizem p.o. and  transferred to telemetry floor.  A 2-D echo was done.  The 2-D echo  showed EF of 55-60% and systolic function was normal.  I did discuss  with Dr. Anne Fu about the 2-D echo finding.  We also got a psychiatric  consult because of the patient's chronic alcoholism, who advised patient  outpatient therapy.  At this time, social work has already set up that  as the patient is ready and enthusiastic to follow up with outpatient  therapy.  We also set up [facility], follow up with Dr.  Reche Dixon as primary care for which patient is very eager to follow up  with.  At this time, the patient is not started on any Coumadin as  patient has history of chronic alcoholism with the risk of falls and  noncompliance.  The patient will be discharged on aspirin.  I advised  the patient that he may need Coumadin chronic therapy once he follows up  with outpatient clinic and stopping him drinking alcohol as it increases  for falls.  The patient is  agreeing to follow the advise.   PROCEDURE:  1. A 2-D echo on June 27, 2008 showed EF of 55-60%.  Systolic      function was normal.  cavity slightly dilated.  Also right atrium      slightly dilated.  Repeat 2-D echo is recommended in 1 year.  2. CT angio chest done on June 25, 2008 showed no evidence of      pulmonary embolic, mild cardiomegaly, coronary artery disease,      emphysema.   LABORATORY DATA:  Hemoglobin and hematocrit were 14.4 and 22.2.  Troponins are all negative.   FINAL DIAGNOSES:  1. Atrial fibrillation with rapid ventricular rate.  2. Chronic alcoholism.   DISCHARGE MEDICATIONS:  1. Diltiazem 60 mg p.o. 4 times daily.  2. Aspirin 325 mg p.o. daily.   PLAN:  1. The patient was to repeat a 2-D echo in 1 year.  2. Follow up with Dr. Reche Dixon as scheduled on Aug 02, 2008 at  2:15      p.m., Tuesday.  At that time, to discuss with Dr. Reche Dixon and find      if patient is willing to be able for taking Coumadin.  3. Advised strongly to quit drinking alcohol.  4. The patient has also been referred as outpatient for further      management of his chronic alcoholism which the patient is very      interested in to follow.  5. The patient is to be on a cardiac-healthy diet.      Eduard Clos, MD  Electronically Signed     ANK/MEDQ  D:  06/28/2008  T:  06/28/2008  Job:  [id number]   cc:   Reche Dixon, MD

## 2010-07-27 NOTE — Discharge Summary (Signed)
NAME:  [name], [name] NO.:  [id number]   MEDICAL RECORD NO.:  [id number]          PATIENT TYPE:  IPS   LOCATION:  0501                          FACILITY:  [facility]   PHYSICIAN:  Jasmine Pang, M.D. DATE OF BIRTH:  1945/10/05   DATE OF ADMISSION:  06/15/2008  DATE OF DISCHARGE:  06/18/2008                               DISCHARGE SUMMARY   IDENTIFICATION:  This is a 65 year old divorced male, who was admitted  on a voluntary basis on June 15, 2008.   HISTORY OF PRESENT ILLNESS:  The patient presents with a history of  alcohol use.  He states he began drinking heavily.  His drinking has  been escalating over the past couple of weeks.  His last drink was on  the day prior to this admission, drinking up to 12 or more beers.  He  has been wanting help.  He also states having problems with his wife.  He feels depressed, but denies any suicidal thoughts.  He has not been  sleeping for the past 4-5 days and his appetite has been satisfactory.  For further admission information, see psychiatric admission assessment.   PHYSICAL FINDINGS:  The patient was fully assessed in the [facility]  Emergency Department.  There were no acute physical or medical problems  noted.   LABORATORY DATA:  The alcohol level was 194.  CMET was within normal  limits.  Chest x-ray revealed no acute changes.   HOSPITAL COURSE:  Upon admission, the patient was started on trazodone  50 mg p.o. q.h.s. p.r.n. insomnia and Librium 25 mg p.o. q.4 h. p.r.n.  anxiety or withdrawal symptoms.  He was also started on 21 mg of  nicotine patch.  In individual sessions, the patient was depressed and  anxious.  He states he has been drinking for the past 2 weeks.  He  drinks every couple of days until the point of the intoxication.  He  drinks about 24 beers with liquor as well.  However, he had not had  drank for 2 days prior to this admission.  On June 17, 2008, case  manager spoke with the patient's ex-wife.   The patient and ex-wife have  been living together for the last 18 months.  She is concerned about his  drinking.  He is on probation for taking her car and did not report to  his probation officer as he was supposed to.  She stated that there is a  warrant out for his arrest at this point.  He was encouraged to think  about an [employer].  He states he does not want to do this.  He was  staying in hotel with friends.  On June 17, 2008, mood was less  depressed.  He was having no side effects to his Librium detox.  He was  having no signs of withdrawal.  Sleep was fair.  Appetite was good.  His  trazodone was increased to 100 mg p.o. q.h.s. with that may repeat x1.  On June 18, 2008, mental status had improved.  The patient was less  depressed, less anxious.  There was no suicidal or homicidal ideation.  No thoughts of self-injurious behavior.  No auditory or visual  hallucinations.  No paranoia or delusions.  Thoughts were logical and  goal-directed.  Thought content, no predominant theme.  Cognitive was  grossly intact.  Insight was fair.  Judgment fair.  Impulse control  fair.  It was felt the patient was safe for discharge today and he  wanted to leave the hospital.   DISCHARGE DIAGNOSES:  Axis I: Alcohol dependence, substance-induced mood  disorder.  Axis II:  None.  Axis III:  None.  Axis IV:  (Problems with primary support group, possible problems with  housing and other psychosocial problems, problems related to legal  system) Severe.  Axis V:  Global assessment of functioning was 55 upon discharge.  Global  assessment of functioning was 45 upon admission.  Global assessment of  functioning highest past year was 65-70.   DISCHARGE PLANS:  There were no dietary or activity level restrictions.   POSTHOSPITAL CARE PLANS:  The patient will go to the [facility] on  June 28, 2008, at 1 p.m.  He will also go to [facility] for  substance abuse assessment on June 21, 2008, at 1 p.m.  He is also to  attend AA meetings daily in the 90/90 model.   DISCHARGE MEDICATIONS:  1. Librium 25 mg take 1 this p.m. and 1 tomorrow and then he will be      finished with his detox.  2. Trazodone 100 mg p.o. q.h.s.      Jasmine Pang, M.D.  Electronically Signed     BHS/MEDQ  D:  07/03/2008  T:  07/04/2008  Job:  [id number]

## 2011-01-03 ENCOUNTER — Emergency Department (HOSPITAL_COMMUNITY): Payer: Self-pay

## 2011-01-03 ENCOUNTER — Observation Stay (HOSPITAL_COMMUNITY)
Admission: EM | Admit: 2011-01-03 | Discharge: 2011-01-04 | Disposition: A | Discharge status: Home / Self Care | Payer: Self-pay | Attending: Internal Medicine | Admitting: Internal Medicine

## 2011-01-03 DIAGNOSIS — F17200 Nicotine dependence, unspecified, uncomplicated: Secondary | ICD-10-CM | POA: Insufficient documentation

## 2011-01-03 DIAGNOSIS — F3289 Other specified depressive episodes: Secondary | ICD-10-CM | POA: Insufficient documentation

## 2011-01-03 DIAGNOSIS — R079 Chest pain, unspecified: Principal | ICD-10-CM | POA: Insufficient documentation

## 2011-01-03 DIAGNOSIS — R911 Solitary pulmonary nodule: Secondary | ICD-10-CM | POA: Insufficient documentation

## 2011-01-03 DIAGNOSIS — F1010 Alcohol abuse, uncomplicated: Secondary | ICD-10-CM | POA: Insufficient documentation

## 2011-01-03 DIAGNOSIS — F329 Major depressive disorder, single episode, unspecified: Secondary | ICD-10-CM | POA: Insufficient documentation

## 2011-01-03 DIAGNOSIS — G4700 Insomnia, unspecified: Secondary | ICD-10-CM | POA: Insufficient documentation

## 2011-01-03 DIAGNOSIS — I4891 Unspecified atrial fibrillation: Secondary | ICD-10-CM | POA: Insufficient documentation

## 2011-01-03 LAB — CBC
HCT: 40.5 % (ref 39.0–52.0)
HCT: 38.5 % — ABNORMAL LOW (ref 39.0–52.0)
Hemoglobin: 13.9 g/dL (ref 13.0–17.0)
Hemoglobin: 13.2 g/dL (ref 13.0–17.0)
MCH: 32.2 pg (ref 26.0–34.0)
MCHC: 34.3 g/dL (ref 30.0–36.0)
MCHC: 34.3 g/dL (ref 30.0–36.0)
MCV: 93.9 fL (ref 78.0–100.0)
RDW: 13.7 % (ref 11.5–15.5)

## 2011-01-03 LAB — URINALYSIS, ROUTINE W REFLEX MICROSCOPIC
Leukocytes, UA: NEGATIVE
Nitrite: NEGATIVE
Specific Gravity, Urine: 1.026 (ref 1.005–1.030)
pH: 5.5 (ref 5.0–8.0)

## 2011-01-03 LAB — POCT I-STAT TROPONIN I
Troponin i, poc: 0.01 ng/mL (ref 0.00–0.08)
Troponin i, poc: 0.01 ng/mL (ref 0.00–0.08)

## 2011-01-03 LAB — DIFFERENTIAL
Basophils Absolute: 0 10*3/uL (ref 0.0–0.1)
Basophils Relative: 0 % (ref 0–1)
Eosinophils Absolute: 0.2 10*3/uL (ref 0.0–0.7)
Lymphocytes Relative: 35 % (ref 12–46)
Monocytes Absolute: 0.8 10*3/uL (ref 0.1–1.0)
Monocytes Relative: 10 % (ref 3–12)
Neutro Abs: 4.4 10*3/uL (ref 1.7–7.7)
Neutrophils Relative %: 49 % (ref 43–77)

## 2011-01-03 LAB — BASIC METABOLIC PANEL
BUN: 19 mg/dL (ref 6–23)
BUN: 17 mg/dL (ref 6–23)
CO2: 27 mEq/L (ref 19–32)
Chloride: 106 mEq/L (ref 96–112)
Chloride: 105 mEq/L (ref 96–112)
Creatinine, Ser: 1.06 mg/dL (ref 0.50–1.35)
GFR calc Af Amer: 87 mL/min — ABNORMAL LOW (ref >90)
Potassium: 4 mEq/L (ref 3.5–5.1)
Potassium: 3.6 mEq/L (ref 3.5–5.1)
Sodium: 140 mEq/L (ref 135–145)

## 2011-01-03 LAB — CK TOTAL AND CKMB (NOT AT ARMC): Relative Index: INVALID (ref 0.0–2.5)

## 2011-01-03 LAB — APTT: aPTT: 30 seconds (ref 24–37)

## 2011-01-03 LAB — RAPID URINE DRUG SCREEN, HOSP PERFORMED
Amphetamines: NOT DETECTED
Benzodiazepines: NOT DETECTED
Opiates: NOT DETECTED

## 2011-01-03 LAB — PROTIME-INR: INR: 1.12 (ref 0.00–1.49)

## 2011-01-03 LAB — TROPONIN I: Troponin I: <0.3 ng/mL (ref <0.30)

## 2011-01-03 MED ORDER — IOHEXOL 300 MG/ML  SOLN
80.0000 mL | Freq: Once | INTRAMUSCULAR | Status: AC | PRN
  Administered 2011-01-03: 80 mL via INTRAVENOUS

## 2011-01-04 LAB — CBC
MCH: 32.2 pg (ref 26.0–34.0)
MCV: 93.8 fL (ref 78.0–100.0)
Platelets: 138 10*3/uL — ABNORMAL LOW (ref 150–400)
RBC: 4.5 MIL/uL (ref 4.22–5.81)
RDW: 13.5 % (ref 11.5–15.5)

## 2011-01-04 LAB — COMPREHENSIVE METABOLIC PANEL
ALT: 14 U/L (ref 0–53)
AST: 16 U/L (ref 0–37)
Albumin: 3.2 g/dL — ABNORMAL LOW (ref 3.5–5.2)
CO2: 28 mEq/L (ref 19–32)
Calcium: 8.9 mg/dL (ref 8.4–10.5)
Creatinine, Ser: 1 mg/dL (ref 0.50–1.35)
Sodium: 142 mEq/L (ref 135–145)
Total Protein: 5.8 g/dL — ABNORMAL LOW (ref 6.0–8.3)

## 2011-01-04 LAB — CARDIAC PANEL(CRET KIN+CKTOT+MB+TROPI)
Relative Index: INVALID (ref 0.0–2.5)
Relative Index: INVALID (ref 0.0–2.5)
Total CK: 37 U/L (ref 7–232)
Total CK: 44 U/L (ref 7–232)
Troponin I: <0.3 ng/mL (ref <0.30)

## 2011-01-04 LAB — LIPID PANEL
HDL: 41 mg/dL (ref >39)
LDL Cholesterol: 71 mg/dL (ref 0–99)
Total CHOL/HDL Ratio: 3.1 RATIO
Triglycerides: 86 mg/dL (ref <150)
VLDL: 17 mg/dL (ref 0–40)

## 2011-01-04 LAB — TSH: TSH: 2.059 u[IU]/mL (ref 0.350–4.500)

## 2011-01-04 LAB — HEMOGLOBIN A1C: Hgb A1c MFr Bld: 5.3 % (ref <5.7)

## 2011-01-08 NOTE — H&P (Signed)
NAME:  [name], [name] NO.:  [id number]  MEDICAL RECORD NO.:  [id number]  LOCATION:  [facility]                         FACILITY:  [facility]  PHYSICIAN:  Peggye Pitt, M.D. DATE OF BIRTH:  04/09/1945  DATE OF ADMISSION:  01/03/2011 DATE OF DISCHARGE:                             HISTORY & PHYSICAL   PRIMARY CARE PHYSICIAN:  Dineen Kid. Reche Dixon, MD at [facility].  COMPLAINT:  Left-sided chest pain.  HISTORY OF PRESENT ILLNESS:  Mr. Durden is a 65 year old Caucasian gentleman with a history of tobacco and alcohol abuse as well as atrial fibrillation.  Per his report, it appears that during her recent hospitalization at [facility], he had what sounds to be a TEE followed by a DC cardioversion for his atrial fibrillation.  He has been deemed a non Coumadin candidate secondary to his continues alcohol abuse.  He presents to the hospital today, complaining of a 3-4 day history of left- sided chest pain.  This is not exertional in nature.  He denies any shortness of breath, diaphoresis, nausea, or vomiting.  ALLERGIES:  He has no known drug allergies.  PAST MEDICAL HISTORY:  Significant for; 1. Atrial fibrillation. 2. Alcohol abuse. 3. Tobacco abuse. 4. Depression with prior suicidal ideation and has required 2 stays at     [facility].  HOME MEDICATIONS:  She is currently not on medications, but apparently following his discharge by [facility] in [place].  He was supposed to be on; 1. Diltiazem. 2. Ativan. 3. Trazodone.  SOCIAL HISTORY:  He smokes about a pack a day for over 45 years.  Admits to drinking anywhere from 6-18 beers a day; although, he states it has been a couple weeks, since he last had any alcohol.  He denies any illicit drug use.  FAMILY HISTORY:  Nonsignificant for heart disease, cancer, and strokes.  REVIEW OF SYSTEMS:  Negative except as mentioned in history of present illness.  PHYSICAL EXAMINATION:  VITAL SIGNS:   On admission, blood pressure 100/63, heart rate 83, respirations 16, temp of 98.0, and sats 96% on room air. GENERAL:  He is alert, awake, and oriented x3, in no current distress. HEENT:  Normocephalic and atraumatic.  Pupils are equal, round, and reactive to light.  He has intact extraocular movements.  He wears corrective lenses. NECK:  Supple.  No JVD.  No lymphadenopathy.  No bruits.  No goiter. HEART:  Irregularly irregular.  I cannot auscultate any murmurs, rubs, or gallops. LUNGS:  Clear to auscultation bilaterally. ABDOMEN:  Soft, nontender, and nondistended with positive bowel sounds. EXTREMITIES:  He has no clubbing, cyanosis, or edema.  Positive pedal pulses. NEUROLOGIC:  Grossly intact and nonfocal.  LABORATORY DATA:  On admission, sodium 140, potassium 4.0, chloride 106, bicarb 25, BUN 19, creatinine 1.03, and glucose 74.  WBCs 8.3, hemoglobin 13.9, and platelets are 147.  Urinalysis is negative.  An INR is 1.12.  First troponin is negative at 0.01.  Chest x-ray shows an ill- defined nodular density in the left mid lung, that was not present on CT in 2010.  An EKG that shows atrial fibrillation of a rate of around 84 beats per minute.  He  has normal axis.  I do not appreciate any ST or T- wave changes.  ASSESSMENT AND PLAN: 1. Chest pain.  At this point, we will admit him to the hospital for     an acute coronary syndrome.  Rule out by the way of EKGs and     cardiac enzymes.  From what he tells me, it appears that he had a     recent stress test during this hospitalization at [facility].  I will request records.  According to records from our     hospital, he had a stress test in September 2010, that showed an     ejection fraction of 60% with no signs for inducible ischemia.  If     he rules out and he indeed had a negative stress test recently,     then I believe no further cardiac workup is warranted at this time. 2. Atrial fibrillation.  I will  restart him on rate-controlling     medications, namely metoprolol.  We will need to be careful with     his blood pressure.  He has already been deemed, not an adequate     Coumadin candidate and I agree with this assessment. 3. Pulmonary nodule.  Of course, this is concerning in person with a     long history of tobacco abuse.  CT scan has been ordered.  We will     follow up in the morning. 4. DVT prophylaxis.  He will be on Lovenox.     Peggye Pitt, M.D.     EH/MEDQ  D:  01/03/2011  T:  01/03/2011  Job:  [id number]  cc:   Dineen Kid. Reche Dixon, M.D.  Electronically Signed by Peggye Pitt M.D. on 01/08/2011 06:23:43 PM

## 2011-01-08 NOTE — Discharge Summary (Signed)
[name], [name] NO.:  [id number]  MEDICAL RECORD NO.:  [id number]  LOCATION:  3714                         FACILITY:  [facility]  PHYSICIAN:  Peggye Pitt, M.D. DATE OF BIRTH:  [birth year]  DATE OF ADMISSION:  01/03/2011 DATE OF DISCHARGE:  01/04/2011                              DISCHARGE SUMMARY   PRIMARY CARE PHYSICIAN:  Dineen Kid. Reche Dixon, MD with [facility].  DISCHARGE DIAGNOSES: 1. Chest pain, ruled out for acute coronary syndrome. 2. Atrial fibrillation, rate controlled. 3. Insomnia. 4. Alcohol abuse. 5. Tobacco abuse.  DISCHARGE MEDICATIONS: 1. Aspirin 81 mg daily. 2. Metoprolol 12.5 mg twice daily. 3. Trazodone 100 mg at bedtime as needed for insomnia. 4. Ativan 0.5 mg at bedtime as needed for insomnia.  DISPOSITION AND FOLLOWUP:  [name] will be discharged home today in stable and improved condition.  He will follow up with [facility] at [facility] in 3-4 weeks.  CONSULTATION THIS HOSPITALIZATION:  None.  IMAGES AND PROCEDURES: 1. A chest x-ray on October 25 that showed an ill-defined nodular     density in the left mid lung and emphysema.  No acute     abnormalities. 2. A CT scan of the chest with contrast on October 25 that showed     centrilobular and paraseptal emphysema without acute process in the     chest.  No CT correlate for the question right lung     abnormality/nodule.  There is evidence for coronary artery     atherosclerosis.  There is a stable left upper lobe 6-mm lung     nodule, consistent with benignity.  HISTORY AND PHYSICAL:  For full details please refer to dictation by myself on October 25.  However in brief, [name] is a 65 year old gentleman with a history of atrial fibrillation, who came in with complaints of chest pain.  Because of this, we were asked to admit him for further evaluation and management.  HOSPITAL COURSE BY PROBLEM: 1. Chest pain.  This quickly resolved after being  brought to the     hospital.  He has ruled out for acute coronary syndrome by the way     of 3 sets of negative cardiac enzymes and EKGs that demonstrate no     acute ST or T-wave abnormalities.  We have received records from     [facility] in [place] and it appears he had a stress     test on August 22 that showed an ejection fraction of about 55%     with no inducible ischemia.  I believe that no further cardiac     workup is indicated at this time. 2. Atrial fibrillation.  He has been rate controlled, however, have     added metoprolol to further help him control rates while he is     exercising.  I have placed him on metoprolol 12.5 mg twice daily.     His [place] score is 0, however, even if he were a candidate for     anticoagulation, his severe alcohol abuse would make him a very     poor candidate. 3. Arrest of chronic conditions are stable.  4. Vitals on day of discharge, blood pressure 124/81, heart rate 82,     respirations 20, temperature 97.4, and sats 96% on room air.     Peggye Pitt, M.D.     EH/MEDQ  D:  01/04/2011  T:  01/04/2011  Job:  [id number]  cc:   Dineen Kid. Reche Dixon, M.D.  Electronically Signed by Peggye Pitt M.D. on 01/08/2011 06:23:54 PM

## 2011-02-11 ENCOUNTER — Inpatient Hospital Stay (HOSPITAL_COMMUNITY)
Admission: EM | Admit: 2011-02-11 | Discharge: 2011-02-12 | DRG: 310 | Disposition: A | Discharge status: Home / Self Care | Payer: MEDICAID | Attending: Internal Medicine | Admitting: Internal Medicine

## 2011-02-11 ENCOUNTER — Emergency Department (HOSPITAL_COMMUNITY): Payer: Self-pay

## 2011-02-11 ENCOUNTER — Other Ambulatory Visit: Payer: Self-pay

## 2011-02-11 DIAGNOSIS — Z9119 Patient's noncompliance with other medical treatment and regimen: Secondary | ICD-10-CM

## 2011-02-11 DIAGNOSIS — R079 Chest pain, unspecified: Secondary | ICD-10-CM

## 2011-02-11 DIAGNOSIS — Z79899 Other long term (current) drug therapy: Secondary | ICD-10-CM

## 2011-02-11 DIAGNOSIS — I4891 Unspecified atrial fibrillation: Secondary | ICD-10-CM

## 2011-02-11 DIAGNOSIS — F1010 Alcohol abuse, uncomplicated: Secondary | ICD-10-CM | POA: Diagnosis present

## 2011-02-11 DIAGNOSIS — F17200 Nicotine dependence, unspecified, uncomplicated: Secondary | ICD-10-CM | POA: Diagnosis present

## 2011-02-11 DIAGNOSIS — Z91199 Patient's noncompliance with other medical treatment and regimen due to unspecified reason: Secondary | ICD-10-CM

## 2011-02-11 DIAGNOSIS — R0789 Other chest pain: Secondary | ICD-10-CM | POA: Diagnosis present

## 2011-02-11 LAB — DIFFERENTIAL
Basophils Relative: 0 % (ref 0–1)
Eosinophils Relative: 0 % (ref 0–5)
Monocytes Absolute: 1 10*3/uL (ref 0.1–1.0)
Monocytes Relative: 10 % (ref 3–12)
Neutro Abs: 6.5 10*3/uL (ref 1.7–7.7)

## 2011-02-11 LAB — RAPID URINE DRUG SCREEN, HOSP PERFORMED
Amphetamines: NOT DETECTED
Benzodiazepines: NOT DETECTED
Cocaine: NOT DETECTED
Opiates: NOT DETECTED

## 2011-02-11 LAB — CBC
HCT: 44 % (ref 39.0–52.0)
Hemoglobin: 15.5 g/dL (ref 13.0–17.0)
MCH: 32.3 pg (ref 26.0–34.0)
MCHC: 35.2 g/dL (ref 30.0–36.0)
MCV: 91.7 fL (ref 78.0–100.0)
RDW: 14.5 % (ref 11.5–15.5)

## 2011-02-11 LAB — PROTIME-INR: INR: 1.21 (ref 0.00–1.49)

## 2011-02-11 LAB — BASIC METABOLIC PANEL
BUN: 11 mg/dL (ref 6–23)
Calcium: 9.1 mg/dL (ref 8.4–10.5)
Chloride: 100 mEq/L (ref 96–112)
Creatinine, Ser: 0.94 mg/dL (ref 0.50–1.35)
GFR calc Af Amer: >90 mL/min (ref >90)

## 2011-02-11 LAB — CARDIAC PANEL(CRET KIN+CKTOT+MB+TROPI): Relative Index: 1.9 (ref 0.0–2.5)

## 2011-02-11 MED ORDER — SODIUM CHLORIDE 0.9 % IJ SOLN: 3.0000 mL | INTRAMUSCULAR | Status: DC | PRN

## 2011-02-11 MED ORDER — GUAIFENESIN ER 600 MG PO TB12
600.0000 mg | ORAL_TABLET | Freq: Two times a day (BID) | ORAL | Status: DC
  Administered 2011-02-11 – 2011-02-12 (×2): 600 mg via ORAL
  Filled 2011-02-11 (×3): qty 1

## 2011-02-11 MED ORDER — SODIUM CHLORIDE 0.9 % IV SOLN: 250.0000 mL | INTRAVENOUS | Status: DC | PRN

## 2011-02-11 MED ORDER — ACETAMINOPHEN 325 MG PO TABS: 650.0000 mg | ORAL_TABLET | Freq: Four times a day (QID) | ORAL | Status: DC | PRN

## 2011-02-11 MED ORDER — DILTIAZEM HCL ER COATED BEADS 120 MG PO CP24
120.0000 mg | ORAL_CAPSULE | ORAL | Status: AC
  Administered 2011-02-11: 120 mg via ORAL
  Filled 2011-02-11: qty 1

## 2011-02-11 MED ORDER — SODIUM CHLORIDE 0.9 % IJ SOLN
3.0000 mL | Freq: Two times a day (BID) | INTRAMUSCULAR | Status: DC
  Administered 2011-02-11 – 2011-02-12 (×2): 3 mL via INTRAVENOUS

## 2011-02-11 MED ORDER — ONDANSETRON HCL 4 MG/2ML IJ SOLN: 4.0000 mg | Freq: Four times a day (QID) | INTRAMUSCULAR | Status: DC | PRN

## 2011-02-11 MED ORDER — MORPHINE SULFATE 2 MG/ML IJ SOLN: 2.0000 mg | INTRAMUSCULAR | Status: DC | PRN

## 2011-02-11 MED ORDER — DILTIAZEM HCL 100 MG IV SOLR
5.0000 mg/h | INTRAVENOUS | Status: AC
  Administered 2011-02-11: 5 mg/h via INTRAVENOUS
  Filled 2011-02-11: qty 100

## 2011-02-11 MED ORDER — DILTIAZEM HCL ER COATED BEADS 120 MG PO CP24
120.0000 mg | ORAL_CAPSULE | Freq: Every day | ORAL | Status: DC
  Administered 2011-02-12: 120 mg via ORAL
  Filled 2011-02-11: qty 1

## 2011-02-11 MED ORDER — DILTIAZEM HCL 50 MG/10ML IV SOLN
20.0000 mg | Freq: Once | INTRAVENOUS | Status: AC
  Administered 2011-02-11: 20 mg via INTRAVENOUS

## 2011-02-11 MED ORDER — NICOTINE 21 MG/24HR TD PT24
21.0000 mg | MEDICATED_PATCH | Freq: Every day | TRANSDERMAL | Status: DC
  Administered 2011-02-11 – 2011-02-12 (×2): 21 mg via TRANSDERMAL
  Filled 2011-02-11 (×2): qty 1

## 2011-02-11 MED ORDER — DILTIAZEM HCL 25 MG/5ML IV SOLN
INTRAVENOUS | Status: AC
  Administered 2011-02-11: 20 mg via INTRAVENOUS
  Filled 2011-02-11: qty 5

## 2011-02-11 MED ORDER — ASPIRIN EC 325 MG PO TBEC
325.0000 mg | DELAYED_RELEASE_TABLET | Freq: Every day | ORAL | Status: DC
  Administered 2011-02-11 – 2011-02-12 (×2): 325 mg via ORAL
  Filled 2011-02-11 (×2): qty 1

## 2011-02-11 MED ORDER — DILTIAZEM HCL ER COATED BEADS 120 MG PO CP24: 120.0000 mg | ORAL_CAPSULE | Freq: Every day | ORAL | Status: DC

## 2011-02-11 MED ORDER — WARFARIN VIDEO: Freq: Once | Status: DC

## 2011-02-11 MED ORDER — ACETAMINOPHEN 650 MG RE SUPP: 650.0000 mg | Freq: Four times a day (QID) | RECTAL | Status: DC | PRN

## 2011-02-11 MED ORDER — PATIENT'S GUIDE TO USING COUMADIN BOOK
Freq: Once | Status: AC
  Administered 2011-02-11: 23:00:00
  Filled 2011-02-11: qty 1

## 2011-02-11 MED ORDER — ENOXAPARIN SODIUM 40 MG/0.4ML ~~LOC~~ SOLN
40.0000 mg | SUBCUTANEOUS | Status: DC
  Administered 2011-02-11: 40 mg via SUBCUTANEOUS
  Filled 2011-02-11 (×2): qty 0.4

## 2011-02-11 MED ORDER — ONDANSETRON HCL 4 MG PO TABS: 4.0000 mg | ORAL_TABLET | Freq: Four times a day (QID) | ORAL | Status: DC | PRN

## 2011-02-11 MED ORDER — WARFARIN SODIUM 7.5 MG PO TABS
7.5000 mg | ORAL_TABLET | Freq: Once | ORAL | Status: AC
  Administered 2011-02-11: 7.5 mg via ORAL
  Filled 2011-02-11: qty 1

## 2011-02-11 NOTE — ED Notes (Signed)
Chest pain began 3 -4 days ago. Pressure, last night pain increased.  Non-radiating, denies any nausea is sob, skin is w/d, resp. E/u

## 2011-02-11 NOTE — ED Notes (Signed)
Pt daughter called, asked pt if it was ok to release info about condition and pt agreed. Pt daughter asked for pt to see social worker in order to find a rehab center for father.

## 2011-02-11 NOTE — ED Notes (Signed)
Patient noted to be tachycardic in 140's to 160's.  Dr. Weldon Inches notified, IV inserted and patient moved back to treatment room.

## 2011-02-11 NOTE — H&P (Signed)
Hospital Admission Note Date: 02/11/2011  Patient name: [name] Medical record number: [id number] Date of birth: 1946/01/09 Age: 65 y.o. Gender: male PCP: [facility]  Medical Service: [facility] Teaching Service  Attending physician:  Dr. Staci Righter   1st Contact:              Dr. Dede Query               Pager: [phone] 2nd Contact:              Dr. Nelda Bucks   Pager: [phone]  After 5 pm or weekends:  1st Contact:      Pager: [phone] 2nd Contact:      Pager: [phone]  Chief Complaint: Chest pressure and palpitation  History of Present Illness: This is a 65 year old male with PMH of Atrial fibrillation, Tobacco abuse and Alcohol abuse who presents to the ED with chest pressure and palpitation. The history is provided by patient. Patient states that he started to have left chest pressure and palpitation at 6 AM today while resting, with tingling sensation radiating to left arm. He also reports mild lightheadedness and dizziness. No chest pain or shortness breath. Patient did not pay attention to his medical symptoms until lunchtime when he had worsening of palpitation, accompanied with shortness breath,dizziness and diaphoresis. Patient states that he could see pulsatile movement on his abdomen when he felt palpitation. Denies chest pain. He decided to come to the ED for further evaluation.  The patient states that he started to have atrial fibrillation "On and Off" 2 years ago, which was followed by [facility] cardiology with medical treatment of Aspirin, Coumadin and Cardizem. Patient reports that [place] And he has not been compliant with his medical treatment. Patient reports that he was put on digoxin at some point which was DC'd 2 months ago. Patient states that he had TEE and cardioversion 2 months ago at [facility] in [place], [place]. And he remained regular rhythm up until one month ago when he  was admitted to [facility] for chest pain and atrial fibrillation. Patient was discharged on metoprolol, which he hasn't taken regularly.  Of note, patient states that he started to have flulike symptoms a week ago, accompanied with running nose,cough with greenish sputum and chest pressure with no radiation. Denies fever, chills or sore throat. No nausea, vomiting, or abdominal pain. No melena, diarrhea or incontinence. No muscle weakness.   Meds: Medications Prior to Admission  Medication Dose Route Frequency Provider Last Rate Last Dose  . diltiazem (CARDIZEM CD) 24 hr capsule 120 mg  120 mg Oral Daily Yoshito Gaza, MD      . diltiazem (CARDIZEM) 100 mg in dextrose 5 % 100 mL infusion  5-15 mg/hr Intravenous To Major Dayton Bailiff, MD 5 mL/hr at 02/11/11 1501 5 mg/hr at 02/11/11 1501  . diltiazem (CARDIZEM) injection SOLN 20 mg  20 mg Intravenous Once Nicholes Stairs, MD   20 mg at 02/11/11 1347   No current outpatient prescriptions on file as of 02/11/2011.   Outpatient Medications  1. Aspirin 325 mg po daily.  Allergies: Review of patient's allergies indicates no known allergies.  Past Medical History  Diagnosis Date  . Atrial fibrillation    History reviewed. No pertinent past surgical history. History reviewed. No pertinent family history. History   Social History  . Marital Status: Divorced    Spouse Name: N/A  Number of Children: N/A  . Years of Education: N/A   Occupational History  . retired.   Social History Main Topics  . Smoking status: Current Everyday Smoker    Types: Cigarettes, 1.5 pack/day for 45 years  . Smokeless tobacco: none  . Alcohol Use: Yes. On and off 12 packs/day for 40 years.     heavy drinker  . Drug Use: No  . Sexually Active: No    Review of Systems: See HPI               Physical Exam: Blood pressure 121/99, pulse 93, temperature 98.3 F (36.8 C), temperature source Oral, resp. rate 20, height 6\' 6"  (1.981 m), weight 205 lb  (92.987 kg), SpO2 100.00%.  General: NAD.  alert, well-developed, and cooperative to examination.  Head: normocephalic and atraumatic.  Eyes: vision grossly intact, pupils equal, pupils round, pupils reactive to light, no injection and anicteric.  Mouth: pharynx pink and moist, no erythema, and no exudates.  Neck: supple, full ROM, no thyromegaly, no JVD, and no carotid bruits.  Lungs: normal respiratory effort, no accessory muscle use, normal breath sounds, no crackles, and no wheezes. Heart: Irregularly irregular, no murmur, no gallop, and no rub.  Abdomen: soft, non-tender, normal bowel sounds, no distention, no guarding, no rebound tenderness, no hepatomegaly, and no splenomegaly.  Msk: no joint swelling, no joint warmth, and no redness over joints.  Pulses: 2+ DP/PT pulses bilaterally Extremities: No cyanosis, clubbing, edema Neurologic: alert & oriented X3, cranial nerves II-XII intact, strength normal in all extremities, sensation intact to light touch, and gait normal.  Skin: turgor normal and no rashes.  Psych: Oriented X3, memory intact for recent and remote, normally interactive, good eye contact, not anxious appearing, and not depressed appearing.    Lab results: Basic Metabolic Panel:  Basename 02/11/11 1337  [name] 137  K 4.4  CL 100  CO2 25  GLUCOSE 83  BUN 11  CREATININE 0.94  CALCIUM 9.1  MG --  PHOS --   CBC:  Basename 02/11/11 1337  WBC 9.7  NEUTROABS 6.5  HGB 15.5  HCT 44.0  MCV 91.7  PLT 160   Cardiac Enzymes:  Basename 02/11/11 1350  CKTOTAL 175  CKMB 3.4  CKMBINDEX --  TROPONINI <0.30   Urine Drug Screen: Drugs of Abuse     Component Value Date/Time   LABOPIA NONE DETECTED 01/03/2011 1718   COCAINSCRNUR NONE DETECTED 01/03/2011 1718   LABBENZ NONE DETECTED 01/03/2011 1718   AMPHETMU NONE DETECTED 01/03/2011 1718   THCU NONE DETECTED 01/03/2011 1718   LABBARB NONE DETECTED 01/03/2011 1718    Imaging results:  Dg Chest Port 1  View  02/11/2011  *RADIOLOGY REPORT*  Clinical Data: Left-sided chest pressure for a couple days.  Short of breath.  PORTABLE CHEST - 1 VIEW  Comparison: 01/03/2011 plain film and CT.  Findings: Midline trachea.  Normal heart size.  No pleural effusion or pneumothorax.  Interstitial thickening is persistent.  Bibasilar volume loss is likely due to AP portable technique.  IMPRESSION:  1.  Chronic interstitial thickening, likely the sequelae of chronic bronchitis/smoking. 2.  Bibasilar volume loss, likely due to AP portable technique.  Original Report Authenticated By: Consuello Bossier, M.D.    Other results: EKG: Atrial fibrillation.   Assessment & Plan by Problem: 1. atrial fibrillation with RVR  The clinical manifestation is consistent with Atrial fibrillation with RVR. Patient has had history of atrial fibrillation in the past with medical noncompliance.  He reports TEE. and cardioversion 2 months ago in [place]. He has been rate controlled after Cardizem bolus and drip. His [place] score is 0. However, given his history of polysubstance abuse, he is prone to have thrombotic or embolic vascular problems.   Plan: - will admit to telemetry          - will check his lytes and Thyroid function TSH          - initiate the treatment of Cardizem 120 mg po daily and stop the cardizem drip.          - may consider cardiology consult if patient is not getting better.          - coumadin for anticoagulation  2.chest pressure.    Chest pressure started one week ago when he had flu-like symptoms and worsened this am with left arm radiation and tingling feeling. Denies chest pain. Given his long term tobacco abuse,  -Will cycle his cardiac enzymes. -Will repeat EKG in a.m.  2. Tobacco abuse. Smoking cessation consult.  Nicotine patch. 3. Alcohol abuse.     Initiate CIWA protocol.  4. VTE: Lovenox.  Signed: Randie Tallarico 02/11/2011, 4:57 PM

## 2011-02-11 NOTE — ED Provider Notes (Signed)
History     CSN: [id number] Arrival date & time: 02/11/2011 12:53 PM   First MD Initiated Contact with Patient 02/11/11 1404      Chief Complaint  Patient presents with  . Chest Pain    chest pain began 3-4 days ago    (Consider location/radiation/quality/duration/timing/severity/associated sxs/prior treatment) HPI Comments: Fibrillation but on no treatment. Works as a Geographical information systems officer for 2 months and states she developed palpitations and chest pain yesterday evening. Has associated shortness of breath. Worse with exertion  Patient is a 65 y.o. male presenting with palpitations. The history is provided by the patient. No language interpreter was used.  Palpitations  This is a new problem. The current episode started yesterday. The problem occurs constantly. The problem has been gradually worsening. The problem is associated with an unknown factor. Associated symptoms include malaise/fatigue, chest pain, chest pressure, irregular heartbeat and shortness of breath. Pertinent negatives include no diaphoresis, no fever, no numbness, no near-syncope, no orthopnea, no PND, no syncope, no abdominal pain, no nausea, no vomiting, no headaches, no dizziness, no weakness and no cough. He has tried nothing for the symptoms.    Past Medical History  Diagnosis Date  . Atrial fibrillation     History reviewed. No pertinent past surgical history.  History reviewed. No pertinent family history.  History  Substance Use Topics  . Smoking status: Current Everyday Smoker    Types: Cigarettes  . Smokeless tobacco: Not on file  . Alcohol Use: Yes     heavy drinker      Review of Systems  Constitutional: Positive for malaise/fatigue. Negative for fever, diaphoresis, activity change, appetite change and fatigue.  HENT: Negative for congestion, sore throat, rhinorrhea, neck pain and neck stiffness.   Respiratory: Positive for shortness of breath. Negative for cough.   Cardiovascular:  Positive for chest pain and palpitations. Negative for orthopnea, syncope, PND and near-syncope.  Gastrointestinal: Negative for nausea, vomiting and abdominal pain.  Genitourinary: Negative for dysuria, urgency, frequency and flank pain.  Neurological: Negative for dizziness, weakness, light-headedness, numbness and headaches.  All other systems reviewed and are negative.    Allergies  Review of patient's allergies indicates no known allergies.  Home Medications   Current Outpatient Rx  Name Route Sig Dispense Refill  . ASPIRIN 325 MG PO TABS Oral Take 325 mg by mouth daily.        BP 121/99  Pulse 93  Temp(Src) 98.3 F (36.8 C) (Oral)  Resp 20  Ht 6\' 6"  (1.981 m)  Wt 205 lb (92.987 kg)  BMI 23.69 kg/m2  SpO2 100%  Physical Exam  Nursing note and vitals reviewed. Constitutional: He is oriented to person, place, and time. He appears well-developed and well-nourished.  HENT:  Head: Normocephalic and atraumatic.  Mouth/Throat: Oropharynx is clear and moist. No oropharyngeal exudate.  Eyes: Conjunctivae and EOM are normal. Pupils are equal, round, and reactive to light.  Neck: Normal range of motion. Neck supple.  Cardiovascular: Normal heart sounds and intact distal pulses.  Exam reveals no gallop and no friction rub.   No murmur heard.      Tachycardic rate.  irreg irreg  Pulmonary/Chest: Effort normal and breath sounds normal. No respiratory distress.  Abdominal: Soft. Bowel sounds are normal. There is no tenderness. There is no rebound and no guarding.  Musculoskeletal: Normal range of motion. He exhibits no tenderness.  Neurological: He is alert and oriented to person, place, and time.  Skin: Skin is warm and  dry.    ED Course  Procedures (including critical care time)  CRITICAL CARE Performed by: Dayton Bailiff  ?  Total critical care time: 30 min  Critical care time was exclusive of separately billable procedures and treating other patients.  Critical care  was necessary to treat or prevent imminent or life-threatening deterioration.  Critical care was time spent personally by me on the following activities: development of treatment plan with patient and/or surrogate as well as nursing, discussions with consultants, evaluation of patient's response to treatment, examination of patient, obtaining history from patient or surrogate, ordering and performing treatments and interventions, ordering and review of laboratory studies, ordering and review of radiographic studies, pulse oximetry and re-evaluation of patient's condition.    Date: 02/11/2011  Rate: 167  Rhythm: atrial fibrillation  QRS Axis: normal  Intervals: normal  ST/T Wave abnormalities: normal  Conduction Disutrbances:none  Narrative Interpretation:   Old EKG Reviewed: changes noted and atrial fibrillation  Labs Reviewed  BASIC METABOLIC PANEL - Abnormal; Notable for the following:    GFR calc non Af Amer 86 (*)    All other components within normal limits  CBC  DIFFERENTIAL  CARDIAC PANEL(CRET KIN+CKTOT+MB+TROPI)  POCT I-STAT TROPONIN I  I-STAT TROPONIN I   Dg Chest Port 1 View  02/11/2011  *RADIOLOGY REPORT*  Clinical Data: Left-sided chest pressure for a couple days.  Short of breath.  PORTABLE CHEST - 1 VIEW  Comparison: 01/03/2011 plain film and CT.  Findings: Midline trachea.  Normal heart size.  No pleural effusion or pneumothorax.  Interstitial thickening is persistent.  Bibasilar volume loss is likely due to AP portable technique.  IMPRESSION:  1.  Chronic interstitial thickening, likely the sequelae of chronic bronchitis/smoking. 2.  Bibasilar volume loss, likely due to AP portable technique.  Original Report Authenticated By: Consuello Bossier, M.D.     1. Atrial fibrillation with RVR   2. Chest pain, unspecified       MDM  AFIB with rapid ventricular response. Cardiac workup will be evaluated. EKG shows heart rate of 160 irregularly irregular. Chest x-ray also  performed. He was placed on diltiazem drip after a bolus. He has no primary care physician therefore he'll be admitted to the outpatient clinics team. On the emergency department. He was reassessed numerous times during his stay. He had improvement of his heart rate on gtt.  No cardiologist        Dayton Bailiff, MD 02/11/11 [phone]

## 2011-02-11 NOTE — ED Notes (Signed)
Pt placed on monitor, noted to be 140's-160's. Dr. Weldon Inches notifed, IV inserted, pt placed on oxygen. Pt moved back to Room 9

## 2011-02-11 NOTE — ED Notes (Signed)
Stopped cardizem drip before giving pt 120mg  cardizem tablet.

## 2011-02-11 NOTE — ED Notes (Signed)
Received bedside report from [place], [place].  Patient currently sitting up in bed; no respiratory or acute distress noted.  Patient updated on plan of care; informed patient that report has been called and that he will be transported upstairs after shift change.  Patient has no other questions or concerns at this time.  Will continue to monitor.

## 2011-02-11 NOTE — H&P (Signed)
Patient seen and personally examined with the residents.  Known paroxysmal afib and hx f ablation but now in afib again.  Will rate control and anticoagulate with coumadin.

## 2011-02-11 NOTE — Progress Notes (Signed)
ANTICOAGULATION CONSULT NOTE - Initial Consult  Pharmacy Consult for coumadin Indication: atrial fibrillation  No Known Allergies  Patient Measurements: Height: 6\' 6"  (198.1 cm) Weight: 205 lb (92.987 kg) IBW/kg (Calculated) : 91.4    Vital Signs: Temp: 98.7 F (37.1 C) (12/03 1912) Temp src: Oral (12/03 1912) BP: 114/77 mmHg (12/03 1912) Pulse Rate: 77  (12/03 1912)  Labs:  Basename 02/11/11 1350 02/11/11 1337  HGB -- 15.5  HCT -- 44.0  PLT -- 160  APTT -- --  LABPROT -- --  INR -- --  HEPARINUNFRC -- --  CREATININE -- 0.94  CKTOTAL 175 --  CKMB 3.4 --  TROPONINI <0.30 --   Estimated Creatinine Clearance: 102.6 ml/min (by C-G formula based on Cr of 0.94).  Medical History: Past Medical History  Diagnosis Date  . Atrial fibrillation     Medications:  Prescriptions prior to admission  Medication Sig Dispense Refill  . aspirin 325 MG tablet Take 325 mg by mouth daily.         Assessment: 65 yo man w/ PHM afib.  Per pt he was on and off coumadin 2 years ago. Coumadin was followed by [facility] cardiology.  He was lost to follow up when he moved out of town. Goal of Therapy:  INR 2-3   Plan:  Coumadin 7.5mg  x1, coumadin book and video. Daily INR  Len Childs T 02/11/2011,9:10 PM

## 2011-02-12 ENCOUNTER — Inpatient Hospital Stay (HOSPITAL_COMMUNITY): Payer: Self-pay

## 2011-02-12 DIAGNOSIS — I4891 Unspecified atrial fibrillation: Secondary | ICD-10-CM

## 2011-02-12 DIAGNOSIS — R079 Chest pain, unspecified: Secondary | ICD-10-CM

## 2011-02-12 LAB — CBC
HCT: 42.7 % (ref 39.0–52.0)
MCV: 93.6 fL (ref 78.0–100.0)
RBC: 4.56 MIL/uL (ref 4.22–5.81)
WBC: 8.2 10*3/uL (ref 4.0–10.5)

## 2011-02-12 LAB — CARDIAC PANEL(CRET KIN+CKTOT+MB+TROPI)
CK, MB: 2.3 ng/mL (ref 0.3–4.0)
Relative Index: INVALID (ref 0.0–2.5)
Relative Index: INVALID (ref 0.0–2.5)
Relative Index: INVALID (ref 0.0–2.5)
Troponin I: <0.3 ng/mL (ref <0.30)
Troponin I: <0.3 ng/mL (ref <0.30)
Troponin I: <0.3 ng/mL (ref <0.30)

## 2011-02-12 LAB — COMPREHENSIVE METABOLIC PANEL
AST: 19 U/L (ref 0–37)
Albumin: 3.5 g/dL (ref 3.5–5.2)
BUN: 12 mg/dL (ref 6–23)
Creatinine, Ser: 1.12 mg/dL (ref 0.50–1.35)
Potassium: 4.7 mEq/L (ref 3.5–5.1)
Total Protein: 6.1 g/dL (ref 6.0–8.3)

## 2011-02-12 LAB — TSH: TSH: 3.902 u[IU]/mL (ref 0.350–4.500)

## 2011-02-12 MED ORDER — ASPIRIN 81 MG PO CHEW
CHEWABLE_TABLET | ORAL | Status: AC
  Administered 2011-02-12: 81 mg
  Filled 2011-02-12: qty 1

## 2011-02-12 MED ORDER — DILTIAZEM HCL ER COATED BEADS 120 MG PO CP24: 120.0000 mg | ORAL_CAPSULE | Freq: Every day | ORAL | Status: DC

## 2011-02-12 MED ORDER — ASPIRIN 81 MG PO CHEW: 81.0000 mg | CHEWABLE_TABLET | Freq: Every day | ORAL | Status: DC

## 2011-02-12 MED ORDER — WARFARIN SODIUM 7.5 MG PO TABS
7.5000 mg | ORAL_TABLET | Freq: Once | ORAL | Status: DC
  Filled 2011-02-12: qty 1

## 2011-02-12 MED ORDER — GUAIFENESIN-DM 100-10 MG/5ML PO SYRP: 5.0000 mL | ORAL_SOLUTION | Freq: Three times a day (TID) | ORAL | Status: AC | PRN

## 2011-02-12 MED ORDER — NICOTINE 21 MG/24HR TD PT24: 1.0000 | MEDICATED_PATCH | TRANSDERMAL | Status: DC

## 2011-02-12 NOTE — Progress Notes (Signed)
ANTICOAGULATION CONSULT NOTE - Follow Up Consult  Pharmacy Consult for Coumadin Indication: atrial fibrillation  No Known Allergies  Patient Measurements: Height: 6\' 6"  (198.1 cm) Weight: 205 lb (92.987 kg) IBW/kg (Calculated) : 91.4   Vital Signs: Temp: 97.6 F (36.4 C) (12/04 0452) Temp src: Oral (12/04 0452) BP: 128/84 mmHg (12/04 0452) Pulse Rate: 97  (12/04 0452)  Labs:  Basename 02/12/11 0600 02/12/11 0103 02/11/11 2106 02/11/11 1350 02/11/11 1337  HGB 14.5 -- -- -- 15.5  HCT 42.7 -- -- -- 44.0  PLT 140* -- -- -- 160  APTT -- -- 30 -- --  LABPROT 15.1 -- 15.6* -- --  INR 1.17 -- 1.21 -- --  HEPARINUNFRC -- -- -- -- --  CREATININE 1.12 -- -- -- 0.94  CKTOTAL 85 97 -- 175 --  CKMB 2.3 2.4 -- 3.4 --  TROPONINI <0.30 <0.30 -- <0.30 --   Estimated Creatinine Clearance: 86.1 ml/min (by C-G formula based on Cr of 1.12).   Medications:  Scheduled:    . aspirin EC  325 mg Oral Daily  . diltiazem  120 mg Oral Daily  . diltiazem  120 mg Oral To Major  . diltiazem (CARDIZEM) infusion  5-15 mg/hr Intravenous To Major  . diltiazem  20 mg Intravenous Once  . enoxaparin (LOVENOX) injection  40 mg Subcutaneous Q24H  . guaiFENesin  600 mg Oral BID  . nicotine  21 mg Transdermal Daily  . patient's guide to using coumadin book   Does not apply Once  . sodium chloride  3 mL Intravenous Q12H  . warfarin  7.5 mg Oral Once  . warfarin   Does not apply Once  . DISCONTD: diltiazem  120 mg Oral Daily    Assessment: 65 yo M on Coumadin for Afib. INR is SUBtherapeutic after first dose of Coumadin last night. Patient has been on Coumadin in the past and has a history of noncompliance. No bleeding noted.  Goal of Therapy:  INR 2-3   Plan:  1. Coumadin 7.5 mg po tonight 2. INR daily 3. Continue to monitor for s/sx of bleeding  Lovell Sheehan 02/12/2011,9:31 AM

## 2011-02-12 NOTE — Consult Note (Signed)
Pt smokes 1- 1 1/2 ppd. Says he needs to quit. Action stage. Pt interested in using the Nicotrol inhaler. Referred to MD for Rx. Discussed inhaler use instructions. Referred to 1-800 quit now for f/u and support. Discussed oral fixation substitutes, second hand smoke and in home smoking policy. Reviewed and gave pt Written education/contact information.

## 2011-02-12 NOTE — Progress Notes (Signed)
Subjective: Patient feels well. Heart rate has been well controlled. Patient states that he only experiences transient chest pressure and palpitation with activity. EKG and cardiac enzymes  have been Negative for ischemia. Objective: Vital signs in last 24 hours: Filed Vitals:   02/11/11 2010 02/12/11 0300 02/12/11 0452 02/12/11 1431  BP: 115/81  128/84 119/83  Pulse: 96  97 85  Temp: 97.7 F (36.5 C)  97.6 F (36.4 C) 98.1 F (36.7 C)  TempSrc: Oral  Oral Tympanic  Resp: 19  19 18   Height:  6\' 6"  (1.981 m)    Weight:  215 lb 14.4 oz (97.932 kg)    SpO2: 92%  95% 100%   Weight change:   Intake/Output Summary (Last 24 hours) at 02/12/11 1814 Last data filed at 02/12/11 1300  Gross per 24 hour  Intake   1060 ml  Output    900 ml  Net    160 ml   Physical Exam: General: NAD. alert, well-developed, and cooperative to examination.  Head: normocephalic and atraumatic.  Eyes: vision grossly intact, pupils equal, pupils round, pupils reactive to light, no injection and anicteric.  Mouth: pharynx pink and moist, no erythema, and no exudates.  Neck: supple, full ROM, no thyromegaly, no JVD, and no carotid bruits.  Lungs: normal respiratory effort, no accessory muscle use, normal breath sounds, no crackles, and no wheezes. Heart: Irregularly irregular, no murmur, no gallop, and no rub.  Abdomen: soft, non-tender, normal bowel sounds, no distention, no guarding, no rebound tenderness, no hepatomegaly, and no splenomegaly.  Msk: no joint swelling, no joint warmth, and no redness over joints.  Pulses: 2+ DP/PT pulses bilaterally Extremities: No cyanosis, clubbing, edema Neurologic: alert & oriented X3, cranial nerves II-XII intact, strength normal in all extremities, sensation intact to light touch, and gait normal.  Skin: turgor normal and no rashes.  Psych: Oriented X3, memory intact for recent and remote, normally interactive, good eye contact, not anxious appearing, and not depressed  appearing.     Lab Results: Basic Metabolic Panel:  Lab 02/12/11 1610 02/11/11 1337  Atticus Wedin 139 137  K 4.7 4.4  CL 103 100  CO2 26 25  GLUCOSE 82 83  BUN 12 11  CREATININE 1.12 0.94  CALCIUM 8.8 9.1  MG 1.9 --  PHOS -- --   Liver Function Tests:  Lab 02/12/11 0600  AST 19  ALT 14  ALKPHOS 91  BILITOT 0.6  PROT 6.1  ALBUMIN 3.5     Lab 02/12/11 0600 02/11/11 1337  WBC 8.2 9.7  NEUTROABS -- 6.5  HGB 14.5 15.5  HCT 42.7 44.0  MCV 93.6 91.7  PLT 140* 160   Cardiac Enzymes:  Lab 02/12/11 1142 02/12/11 0600 02/12/11 0103  CKTOTAL 81 85 97  CKMB 2.1 2.3 2.4  CKMBINDEX -- -- --  TROPONINI <0.30 <0.30 <0.30   BThyroid Function Tests:  Lab 02/11/11 2106  TSH 3.902  T4TOTAL --  FREET4 --  T3FREE --  THYROIDAB --   Coagulation:  Lab 02/12/11 0600 02/11/11 2106  LABPROT 15.1 15.6*  INR 1.17 1.21   Urine Drug Screen: Drugs of Abuse     Component Value Date/Time   LABOPIA NONE DETECTED 02/11/2011 2305   COCAINSCRNUR NONE DETECTED 02/11/2011 2305   LABBENZ NONE DETECTED 02/11/2011 2305   AMPHETMU NONE DETECTED 02/11/2011 2305   THCU NONE DETECTED 02/11/2011 2305   LABBARB NONE DETECTED 02/11/2011 2305     Misc. Labs: HIV nonreactive  Micro Results: No results  found for this or any previous visit (from the past 240 hour(s)). Studies/Results: US Abdomen Complete  02/12/2011  *RADIOLOGY REPORT*  Clinical Data:  Abdominal pain, chest pain and history of hepatitis B infection and alcohol abuse.  ABDOMEN ULTRASOUND  Technique:  Complete abdominal ultrasound examination was performed including evaluation of the liver, gallbladder, bile ducts, pancreas, kidneys, spleen, IVC, and abdominal aorta.  Comparison:  10/04/2009  Findings:  Gallbladder:  No shadowing gallstones or echogenic sludge.  No gallbladder wall thickening or pericholecystic fluid.  Negative sonographic Murphy's sign according to the ultrasound technologist.  Common Bile Duct:  Normal caliber of 5 mm.   Liver:  Liver echotexture is mildly heterogeneous without obvious focal mass or intrahepatic biliary ductal dilatation.  The portal vein is open.  IVC:  Patent throughout its visualized course in the abdomen.  Pancreas:  Although the pancreas is difficult to visualize in its entirety, no focal pancreatic abnormality is identified.  Spleen:  The spleen shows normal echotexture and size.  Kidneys:  The right kidney measures 11.6 cm and the left kidney 12.0 cm. Both show no evidence of hydronephrosis or focal lesion. Right renal cortex is mildly echogenic, suggesting component of chronic disease.  Left renal cortex shows normal echogenicity.  Abdominal Aorta:  Abdominal aorta shows atherosclerotic changes. No evidence of abdominal aortic aneurysm with maximal measured AP diameter of 2.8 cm.  IMPRESSION:  1.  No evidence of abdominal aortic aneurysm. 2.  Mildly increased echogenicity of the right renal cortex suggesting component of underlying chronic disease.  Original Report Authenticated By: Reola Calkins, M.D.   Dg Chest Port 1 View  02/11/2011  *RADIOLOGY REPORT*  Clinical Data: Left-sided chest pressure for a couple days.  Short of breath.  PORTABLE CHEST - 1 VIEW  Comparison: 01/03/2011 plain film and CT.  Findings: Midline trachea.  Normal heart size.  No pleural effusion or pneumothorax.  Interstitial thickening is persistent.  Bibasilar volume loss is likely due to AP portable technique.  IMPRESSION:  1.  Chronic interstitial thickening, likely the sequelae of chronic bronchitis/smoking. 2.  Bibasilar volume loss, likely due to AP portable technique.  Original Report Authenticated By: Consuello Bossier, M.D.   Medications: I have reviewed the patient's current medications. Scheduled Meds:   . aspirin      . diltiazem  120 mg Oral To Major  . diltiazem (CARDIZEM) infusion  5-15 mg/hr Intravenous To Major  . patient's guide to using coumadin book   Does not apply Once  . warfarin  7.5 mg Oral Once    . DISCONTD: aspirin  81 mg Oral Daily  . DISCONTD: aspirin EC  325 mg Oral Daily  . DISCONTD: diltiazem  120 mg Oral Daily  . DISCONTD: diltiazem  120 mg Oral Daily  . DISCONTD: enoxaparin (LOVENOX) injection  40 mg Subcutaneous Q24H  . DISCONTD: guaiFENesin  600 mg Oral BID  . DISCONTD: nicotine  21 mg Transdermal Daily  . DISCONTD: sodium chloride  3 mL Intravenous Q12H  . DISCONTD: warfarin  7.5 mg Oral ONCE-1800  . DISCONTD: warfarin   Does not apply Once   Continuous Infusions:  PRN Meds:.DISCONTD: sodium chloride, DISCONTD: acetaminophen, DISCONTD: acetaminophen, DISCONTD: morphine, DISCONTD: ondansetron (ZOFRAN) IV, DISCONTD: ondansetron, DISCONTD: sodium chloride Assessment/Plan:  1. atrial fibrillation with RVR  The clinical manifestation is consistent with Atrial fibrillation with RVR. Patient has had history of atrial fibrillation in the past with medical noncompliance. He reports TEE. and cardioversion 2 months ago in [place].  He has been rate controlled after Cardizem bolus and drip. His [place] score is 0. Electrolytes and TSH are WNL. Patient rate has been controlled well since initiation of Cardizem oral treatment.  - will continue telemetry monitoring. -continue Cardizem 120 mg po daily  - will discontinue Coumadin given the fact that his [place] 2 score is 0 and medical noncompliance is a concerning issue. - Will change aspirin to 81 mg po daily.  2.chest pressure.  Chest pressure started one week ago when he had flu-like symptoms and worsened this am with left arm radiation and tingling feeling. Denies chest pain. His chest pressure is not consistent with the characteristic of cardiac ischemia.  His cardiac enzymes and EKG are all normal. - will instruct patient to follow up with [facility].  2. Tobacco abuse. Smoking cessation consult. Nicotine patch.   3. Alcohol abuse. No DT.   CIWA protocol.  4. VTE: Lovenox.       LOS: 1 day   Macdonald Rigor 02/12/2011,  6:14 PM

## 2011-02-12 NOTE — H&P (Signed)
Internal Medicine Teaching Service Attending Note Date: 02/12/2011  Patient name: [name]  Medical record number: [id number]  Date of birth: Nov 18, 1945   I have seen and evaluated [name] and discussed their care with the Residency Team.   History of Present Illness:  This is a 65 year old male with PMH of paroxysmal Atrial fibrillation, Tobacco and Alcohol abuse comes with chest pressure and palpitation. Patient states that he started to have left chest pressure and palpitation at 6 AM today while resting, with tingling sensation radiating to left arm and mild lightheadedness and dizziness.    He decided to come to the ED for further evaluation.  He has a history of afib which sounds like paroxysma and was followed by [facility] cardiology with medical treatment of Aspirin, Coumadin and Cardizem. But moved out of the area.  He also reports having been on digoxin. Patient states that he had TEE and cardioversion 2 months ago at [facility] in [place], [place]. He was stable until last month and was  Patient was discharged on metoprolol, which he hasn't taken regularly.   Physical Exam: Blood pressure 128/84, pulse 97, temperature 97.6 F (36.4 C), temperature source Oral, resp. rate 19, height 6\' 6"  (1.981 m), weight 215 lb 14.4 oz (97.932 kg), SpO2 95.00%. BP 128/84  Pulse 97  Temp(Src) 97.6 F (36.4 C) (Oral)  Resp 19  Ht 6\' 6"  (1.981 m)  Wt 215 lb 14.4 oz (97.932 kg)  BMI 24.95 kg/m2  SpO2 95%  General Appearance:    Alert, cooperative, no distress, appears stated age  Head:    Normocephalic, without obvious abnormality, atraumatic   Heart:    Regular rate and rhythm, S1 and S2 normal, no murmur, rub   or gallop  Abdomen:     Soft, non-tender, bowel sounds active all four quadrants,    no masses, no organomegaly                                                      Lab results: Results for orders placed during the hospital encounter of  02/11/11 (from the past 24 hour(s))  CBC     Status: Normal   Collection Time   02/11/11  1:37 PM      Component Value Range   WBC 9.7  4.0 - 10.5 (K/uL)   RBC 4.80  4.22 - 5.81 (MIL/uL)   Hemoglobin 15.5  13.0 - 17.0 (g/dL)   HCT 40.9  81.1 - 91.4 (%)   MCV 91.7  78.0 - 100.0 (fL)   MCH 32.3  26.0 - 34.0 (pg)   MCHC 35.2  30.0 - 36.0 (g/dL)   RDW 78.2  95.6 - 21.3 (%)   Platelets 160  150 - 400 (K/uL)  DIFFERENTIAL     Status: Normal   Collection Time   02/11/11  1:37 PM      Component Value Range   Neutrophils Relative 67  43 - 77 (%)   Neutro Abs 6.5  1.7 - 7.7 (K/uL)   Lymphocytes Relative 23  12 - 46 (%)   Lymphs Abs 2.2  0.7 - 4.0 (K/uL)   Monocytes Relative 10  3 - 12 (%)   Monocytes Absolute 1.0  0.1 - 1.0 (K/uL)   Eosinophils Relative 0  0 - 5 (%)  Eosinophils Absolute 0.0  0.0 - 0.7 (K/uL)   Basophils Relative 0  0 - 1 (%)   Basophils Absolute 0.0  0.0 - 0.1 (K/uL)  BASIC METABOLIC PANEL     Status: Abnormal   Collection Time   02/11/11  1:37 PM      Component Value Range   Sodium 137  135 - 145 (mEq/L)   Potassium 4.4  3.5 - 5.1 (mEq/L)   Chloride 100  96 - 112 (mEq/L)   CO2 25  19 - 32 (mEq/L)   Glucose, Bld 83  70 - 99 (mg/dL)   BUN 11  6 - 23 (mg/dL)   Creatinine, Ser 1.61  0.50 - 1.35 (mg/dL)   Calcium 9.1  8.4 - 09.6 (mg/dL)   GFR calc non Af Amer 86 (*) >90 (mL/min)   GFR calc Af Amer >90  >90 (mL/min)  CARDIAC PANEL(CRET KIN+CKTOT+MB+TROPI)     Status: Normal   Collection Time   02/11/11  1:50 PM      Component Value Range   Total CK 175  7 - 232 (U/L)   CK, MB 3.4  0.3 - 4.0 (ng/mL)   Troponin I <0.30  <0.30 (ng/mL)   Relative Index 1.9  0.0 - 2.5   POCT I-STAT TROPONIN I     Status: Normal   Collection Time   02/11/11  2:06 PM      Component Value Range   Troponin i, poc 0.00  0.00 - 0.08 (ng/mL)   Comment 3           HIV ANTIBODY (ROUTINE TESTING)     Status: Normal   Collection Time   02/11/11  9:06 PM      Component Value Range   HIV NON  REACTIVE  NON REACTIVE   TSH     Status: Normal   Collection Time   02/11/11  9:06 PM      Component Value Range   TSH 3.902  0.350 - 4.500 (uIU/mL)  APTT     Status: Normal   Collection Time   02/11/11  9:06 PM      Component Value Range   aPTT 30  24 - 37 (seconds)  PROTIME-INR     Status: Abnormal   Collection Time   02/11/11  9:06 PM      Component Value Range   Prothrombin Time 15.6 (*) 11.6 - 15.2 (seconds)   INR 1.21  0.00 - 1.49   URINE RAPID DRUG SCREEN (HOSP PERFORMED)     Status: Normal   Collection Time   02/11/11 11:05 PM      Component Value Range   Opiates NONE DETECTED  NONE DETECTED    Cocaine NONE DETECTED  NONE DETECTED    Benzodiazepines NONE DETECTED  NONE DETECTED    Amphetamines NONE DETECTED  NONE DETECTED    Tetrahydrocannabinol NONE DETECTED  NONE DETECTED    Barbiturates NONE DETECTED  NONE DETECTED   CARDIAC PANEL(CRET KIN+CKTOT+MB+TROPI)     Status: Normal   Collection Time   02/12/11  1:03 AM      Component Value Range   Total CK 97  7 - 232 (U/L)   CK, MB 2.4  0.3 - 4.0 (ng/mL)   Troponin I <0.30  <0.30 (ng/mL)   Relative Index RELATIVE INDEX IS INVALID  0.0 - 2.5   CARDIAC PANEL(CRET KIN+CKTOT+MB+TROPI)     Status: Normal   Collection Time   02/12/11  6:00 AM  Component Value Range   Total CK 85  7 - 232 (U/L)   CK, MB 2.3  0.3 - 4.0 (ng/mL)   Troponin I <0.30  <0.30 (ng/mL)   Relative Index RELATIVE INDEX IS INVALID  0.0 - 2.5   COMPREHENSIVE METABOLIC PANEL     Status: Abnormal   Collection Time   02/12/11  6:00 AM      Component Value Range   Sodium 139  135 - 145 (mEq/L)   Potassium 4.7  3.5 - 5.1 (mEq/L)   Chloride 103  96 - 112 (mEq/L)   CO2 26  19 - 32 (mEq/L)   Glucose, Bld 82  70 - 99 (mg/dL)   BUN 12  6 - 23 (mg/dL)   Creatinine, Ser 7.82  0.50 - 1.35 (mg/dL)   Calcium 8.8  8.4 - 95.6 (mg/dL)   Total Protein 6.1  6.0 - 8.3 (g/dL)   Albumin 3.5  3.5 - 5.2 (g/dL)   AST 19  0 - 37 (U/L)   ALT 14  0 - 53 (U/L)   Alkaline  Phosphatase 91  39 - 117 (U/L)   Total Bilirubin 0.6  0.3 - 1.2 (mg/dL)   GFR calc non Af Amer 68 (*) >90 (mL/min)   GFR calc Af Amer 78 (*) >90 (mL/min)  CBC     Status: Abnormal   Collection Time   02/12/11  6:00 AM      Component Value Range   WBC 8.2  4.0 - 10.5 (K/uL)   RBC 4.56  4.22 - 5.81 (MIL/uL)   Hemoglobin 14.5  13.0 - 17.0 (g/dL)   HCT 21.3  08.6 - 57.8 (%)   MCV 93.6  78.0 - 100.0 (fL)   MCH 31.8  26.0 - 34.0 (pg)   MCHC 34.0  30.0 - 36.0 (g/dL)   RDW 46.9  62.9 - 52.8 (%)   Platelets 140 (*) 150 - 400 (K/uL)  MAGNESIUM     Status: Normal   Collection Time   02/12/11  6:00 AM      Component Value Range   Magnesium 1.9  1.5 - 2.5 (mg/dL)  PROTIME-INR     Status: Normal   Collection Time   02/12/11  6:00 AM      Component Value Range   Prothrombin Time 15.1  11.6 - 15.2 (seconds)   INR 1.17  0.00 - 1.49     Imaging results:  US Abdomen Complete  02/12/2011  *RADIOLOGY REPORT*  Clinical Data:  Abdominal pain, chest pain and history of hepatitis B infection and alcohol abuse.  ABDOMEN ULTRASOUND  Technique:  Complete abdominal ultrasound examination was performed including evaluation of the liver, gallbladder, bile ducts, pancreas, kidneys, spleen, IVC, and abdominal aorta.  Comparison:  10/04/2009  Findings:  Gallbladder:  No shadowing gallstones or echogenic sludge.  No gallbladder wall thickening or pericholecystic fluid.  Negative sonographic Murphy's sign according to the ultrasound technologist.  Common Bile Duct:  Normal caliber of 5 mm.  Liver:  Liver echotexture is mildly heterogeneous without obvious focal mass or intrahepatic biliary ductal dilatation.  The portal vein is open.  IVC:  Patent throughout its visualized course in the abdomen.  Pancreas:  Although the pancreas is difficult to visualize in its entirety, no focal pancreatic abnormality is identified.  Spleen:  The spleen shows normal echotexture and size.  Kidneys:  The right kidney measures 11.6 cm and  the left kidney 12.0 cm. Both show no evidence of hydronephrosis or focal  lesion. Right renal cortex is mildly echogenic, suggesting component of chronic disease.  Left renal cortex shows normal echogenicity.  Abdominal Aorta:  Abdominal aorta shows atherosclerotic changes. No evidence of abdominal aortic aneurysm with maximal measured AP diameter of 2.8 cm.  IMPRESSION:  1.  No evidence of abdominal aortic aneurysm. 2.  Mildly increased echogenicity of the right renal cortex suggesting component of underlying chronic disease.  Original Report Authenticated By: Reola Calkins, M.D.   Dg Chest Port 1 View  02/11/2011  *RADIOLOGY REPORT*  Clinical Data: Left-sided chest pressure for a couple days.  Short of breath.  PORTABLE CHEST - 1 VIEW  Comparison: 01/03/2011 plain film and CT.  Findings: Midline trachea.  Normal heart size.  No pleural effusion or pneumothorax.  Interstitial thickening is persistent.  Bibasilar volume loss is likely due to AP portable technique.  IMPRESSION:  1.  Chronic interstitial thickening, likely the sequelae of chronic bronchitis/smoking. 2.  Bibasilar volume loss, likely due to AP portable technique.  Original Report Authenticated By: Consuello Bossier, M.D.    Assessment and Plan: I agree with the formulated Assessment and Plan with the following changes: Patient has a CHADS score of 0 and therefore coumadin not indicated.  Will continue with 81mg  aspirin and arrange close cardiology follow up. He will be started on po CCB and dose will be titrated.

## 2011-02-14 NOTE — Discharge Summary (Signed)
Internal Medicine Teaching [facility] Discharge Note  Name: [name] MRN: [id number] DOB: 1945/11/10 65 y.o.  Date of Admission: 02/11/2011 12:53 PM Date of Discharge: 02/12/2011 Attending Physician: No att. providers found  Discharge Diagnosis: 1. Atrial fibrillation with RVR 2. Atypical chest pressure 3. Tobacco abuse 4. Alcohol abuse 5. Medical noncompliance  Discharge Medications: Discharge Medication List as of 02/14/2011  9:06 PM    START taking these medications   Details  aspirin 81 MG chewable tablet Chew 1 tablet (81 mg total) by mouth daily., Starting 02/12/2011, Until Wed 02/12/12, Print    diltiazem (CARDIZEM CD) 120 MG 24 hr capsule Take 1 capsule (120 mg total) by mouth daily., Starting 02/12/2011, Until Wed 02/12/12, Print    guaiFENesin-dextromethorphan (ROBITUSSIN DM) 100-10 MG/5ML syrup Take 5 mLs by mouth 3 (three) times daily as needed for cough., Starting 02/12/2011, Until Fri 02/22/11, Print    nicotine (NICODERM CQ - DOSED IN MG/24 HOURS) 21 mg/24hr patch Place 1 patch (21 patches total) onto the skin daily., Starting 02/12/2011, Until Thu 03/14/11, Print      STOP taking these medications     aspirin 325 MG tablet         Disposition and follow-up:   [name] was discharged from [facility] in Good condition.    Follow-up Appointments: Follow-up Information    Follow up with Jeffrey Newcomer, PA. Make an appointment on 02/27/2011. (at 830 am )    Contact information:   [address] [place] [address] [phone]       Follow up with [facility] in 1 week.   Contact information:   [address] [place] [address]         Discharge Orders    Future Appointments: Provider: Department: Dept Phone: Center:   02/25/2011 8:30 AM Jeffrey Lecher, PA [facility] [phone] [facility]      Consultations:  smoking cessation counselor  Procedures Performed:  US  Abdomen Complete  02/12/2011  *RADIOLOGY REPORT*  Clinical Data:  Abdominal pain, chest pain and history of hepatitis B infection and alcohol abuse.  ABDOMEN ULTRASOUND  Technique:  Complete abdominal ultrasound examination was performed including evaluation of the liver, gallbladder, bile ducts, pancreas, kidneys, spleen, IVC, and abdominal aorta.  Comparison:  10/04/2009  Findings:  Gallbladder:  No shadowing gallstones or echogenic sludge.  No gallbladder wall thickening or pericholecystic fluid.  Negative sonographic Murphy's sign according to the ultrasound technologist.  Common Bile Duct:  Normal caliber of 5 mm.  Liver:  Liver echotexture is mildly heterogeneous without obvious focal mass or intrahepatic biliary ductal dilatation.  The portal vein is open.  IVC:  Patent throughout its visualized course in the abdomen.  Pancreas:  Although the pancreas is difficult to visualize in its entirety, no focal pancreatic abnormality is identified.  Spleen:  The spleen shows normal echotexture and size.  Kidneys:  The right kidney measures 11.6 cm and the left kidney 12.0 cm. Both show no evidence of hydronephrosis or focal lesion. Right renal cortex is mildly echogenic, suggesting component of chronic disease.  Left renal cortex shows normal echogenicity.  Abdominal Aorta:  Abdominal aorta shows atherosclerotic changes. No evidence of abdominal aortic aneurysm with maximal measured AP diameter of 2.8 cm.  IMPRESSION:  1.  No evidence of abdominal aortic aneurysm. 2.  Mildly increased echogenicity of the right renal cortex suggesting component of underlying chronic disease.  Original Report Authenticated By:  [name], M.D.   Dg Chest Port 1 View  02/11/2011  *RADIOLOGY REPORT*  Clinical Data: Left-sided chest pressure for a couple days.  Short of breath.  PORTABLE CHEST - 1 VIEW  Comparison: 01/03/2011 plain film and CT.  Findings: Midline trachea.  Normal heart size.  No pleural effusion or pneumothorax.   Interstitial thickening is persistent.  Bibasilar volume loss is likely due to AP portable technique.  IMPRESSION:  1.  Chronic interstitial thickening, likely the sequelae of chronic bronchitis/smoking. 2.  Bibasilar volume loss, likely due to AP portable technique.  Original Report Authenticated By: [name], M.D.    Admission HPI:   This is a 65 year old male with PMH of Atrial fibrillation, Tobacco abuse and Alcohol abuse who presents to the ED with chest pressure and palpitation.  The history is provided by patient. Patient states that he started to have left chest pressure and palpitation at 6 AM today while resting, with tingling sensation radiating to left arm. He also reports mild lightheadedness and dizziness. No chest pain or shortness breath. Patient did not pay attention to his medical symptoms until lunchtime when he had worsening of palpitation, accompanied with shortness breath,dizziness and diaphoresis.  Patient states that he could see pulsatile movement on his abdomen when he felt palpitation. Denies chest pain. He decided to come to the ED for further evaluation.  The patient states that he started to have atrial fibrillation "On and Off" 2 years ago, which was followed by [facility] cardiology with medical treatment of Aspirin, Coumadin and Cardizem. Patient reports [place] And he has not been compliant with his medical treatment. Patient reports that he was put on digoxin at some point which was DC'd 2 months ago. Patient states that he had TEE and cardioversion 2 months ago at [facility] in [place], [place]. And he remained regular rhythm up until one month ago when he was admitted to [facility] for chest pain and atrial fibrillation. Patient was discharged on metoprolol, which he hasn't taken regularly.  Of note, patient states that he started to have flulike symptoms a week ago, accompanied with  running nose,cough with greenish sputum and chest pressure with no radiation. Denies fever, chills or sore throat. No nausea, vomiting, or abdominal pain. No melena, diarrhea or incontinence.  No muscle weakness.   Hospital Course by problem list: 1. Atrial fibrillation with RVR     Patient presents with Atrial fibrillation with RVR. He has had history of atrial fibrillation in the past with medical noncompliance.  Patient was started on Cardizem bolus and drip with good rate control. Subsequently, Cardizem 120 mg po daily was initiated for his A Frib treatment. His condition has largely improved upon discharge. He remains in the A Fib with HR 80's. Patient will be discharged with Cardizem 120 mg po daily to follow up with [facility] Cardiology as out patient.     His [place] score is 0. Electrolytes and TSH are WNL. We will initiate anticoagulation therapy with aspirin to 81 mg po daily. Coumadin is not indicated.  2. Atypical chest pressure     Patient presents with Subjective atypical chest pressure starting one week before the admission at the same time when he had "cold". All cardiac work ups have been negative for any ischemia. The etiology of his chest pressure is likely multifactoral, including cold/ chest congestion and long term smoking.   Patient will be  discharged home with cardiology follow up.   3. Tobacco abuse, smoking cessation educations   4. Alcohol abuse Patient is instructed to stop drinking alcohol.  5. Medical noncompliance Patient is reinforced on the importance of followup with his cardiologist and taking his medications.  Discharge Vitals:  BP 119/83  Pulse 85  Temp(Src) 98.1 F (36.7 C) (Tympanic)  Resp 18  Ht 6\' 6"  (1.981 m)  Wt 215 lb 14.4 oz (97.932 kg)  BMI 24.95 kg/m2  SpO2 100%  Discharge Labs: No results found for this or any previous visit (from the past 24 hour(s)).  Signed: Ottilia Pippenger 02/14/2011, 9:11 PM

## 2011-02-25 ENCOUNTER — Encounter: Payer: Self-pay | Admitting: Physician Assistant

## 2011-03-06 ENCOUNTER — Encounter (HOSPITAL_COMMUNITY): Payer: Self-pay

## 2011-03-06 ENCOUNTER — Emergency Department (HOSPITAL_COMMUNITY): Payer: Self-pay

## 2011-03-06 ENCOUNTER — Other Ambulatory Visit: Payer: Self-pay

## 2011-03-06 ENCOUNTER — Inpatient Hospital Stay (HOSPITAL_COMMUNITY)
Admission: EM | Admit: 2011-03-06 | Discharge: 2011-03-13 | DRG: 309 | Disposition: A | Discharge status: Home / Self Care | Payer: 59 | Attending: Cardiology | Admitting: Cardiology

## 2011-03-06 DIAGNOSIS — Z7982 Long term (current) use of aspirin: Secondary | ICD-10-CM

## 2011-03-06 DIAGNOSIS — F1020 Alcohol dependence, uncomplicated: Secondary | ICD-10-CM | POA: Diagnosis present

## 2011-03-06 DIAGNOSIS — F10239 Alcohol dependence with withdrawal, unspecified: Secondary | ICD-10-CM | POA: Diagnosis present

## 2011-03-06 DIAGNOSIS — R079 Chest pain, unspecified: Secondary | ICD-10-CM | POA: Diagnosis present

## 2011-03-06 DIAGNOSIS — F3289 Other specified depressive episodes: Secondary | ICD-10-CM | POA: Diagnosis present

## 2011-03-06 DIAGNOSIS — F10939 Alcohol use, unspecified with withdrawal, unspecified: Secondary | ICD-10-CM | POA: Diagnosis present

## 2011-03-06 DIAGNOSIS — I4891 Unspecified atrial fibrillation: Principal | ICD-10-CM | POA: Diagnosis present

## 2011-03-06 DIAGNOSIS — F17200 Nicotine dependence, unspecified, uncomplicated: Secondary | ICD-10-CM | POA: Diagnosis present

## 2011-03-06 DIAGNOSIS — F1010 Alcohol abuse, uncomplicated: Secondary | ICD-10-CM | POA: Diagnosis present

## 2011-03-06 DIAGNOSIS — F329 Major depressive disorder, single episode, unspecified: Secondary | ICD-10-CM | POA: Diagnosis present

## 2011-03-06 HISTORY — DX: Inflammatory liver disease, unspecified: K75.9

## 2011-03-06 LAB — DIFFERENTIAL
Basophils Relative: 0 % (ref 0–1)
Lymphs Abs: 3.5 10*3/uL (ref 0.7–4.0)
Monocytes Absolute: 0.6 10*3/uL (ref 0.1–1.0)
Monocytes Relative: 7 % (ref 3–12)
Neutro Abs: 4.6 10*3/uL (ref 1.7–7.7)

## 2011-03-06 LAB — COMPREHENSIVE METABOLIC PANEL
ALT: 11 U/L (ref 0–53)
Alkaline Phosphatase: 93 U/L (ref 39–117)
BUN: 12 mg/dL (ref 6–23)
CO2: 24 mEq/L (ref 19–32)
Chloride: 105 mEq/L (ref 96–112)
GFR calc Af Amer: >90 mL/min (ref >90)
GFR calc non Af Amer: >90 mL/min (ref >90)
Glucose, Bld: 102 mg/dL — ABNORMAL HIGH (ref 70–99)
Potassium: 3.9 mEq/L (ref 3.5–5.1)
Total Bilirubin: 0.3 mg/dL (ref 0.3–1.2)

## 2011-03-06 LAB — ETHANOL: Alcohol, Ethyl (B): <11 mg/dL (ref 0–11)

## 2011-03-06 LAB — POCT I-STAT, CHEM 8
BUN: 11 mg/dL (ref 6–23)
Calcium, Ion: 1.16 mmol/L (ref 1.12–1.32)
Chloride: 103 mEq/L (ref 96–112)
Creatinine, Ser: 0.9 mg/dL (ref 0.50–1.35)
Glucose, Bld: 102 mg/dL — ABNORMAL HIGH (ref 70–99)
HCT: 48 % (ref 39.0–52.0)

## 2011-03-06 LAB — CBC
HCT: 43.9 % (ref 39.0–52.0)
Hemoglobin: 15.5 g/dL (ref 13.0–17.0)
MCH: 32.2 pg (ref 26.0–34.0)
MCHC: 35.3 g/dL (ref 30.0–36.0)

## 2011-03-06 LAB — LIPASE, BLOOD: Lipase: 32 U/L (ref 11–59)

## 2011-03-06 MED ORDER — DILTIAZEM HCL 100 MG IV SOLR
5.0000 mg/h | Freq: Once | INTRAVENOUS | Status: AC
  Administered 2011-03-06: 5 mg/h via INTRAVENOUS
  Filled 2011-03-06: qty 100

## 2011-03-06 MED ORDER — SODIUM CHLORIDE 0.9 % IV BOLUS (SEPSIS)
1000.0000 mL | Freq: Once | INTRAVENOUS | Status: AC
  Administered 2011-03-06: 1000 mL via INTRAVENOUS

## 2011-03-06 MED ORDER — LORAZEPAM 2 MG/ML IJ SOLN
0.5000 mg | Freq: Once | INTRAMUSCULAR | Status: AC
  Administered 2011-03-07: 0.5 mg via INTRAVENOUS
  Filled 2011-03-06: qty 1

## 2011-03-06 NOTE — ED Provider Notes (Signed)
History     CSN: [id number]  Arrival date & time 03/06/11  2059   First MD Initiated Contact with Patient 03/06/11 2117      Chief Complaint  Patient presents with  . Chest Pain    (Consider location/radiation/quality/duration/timing/severity/associated sxs/prior treatment) Patient is a 65 y.o. male presenting with chest pain. The history is provided by the patient.  Chest Pain The chest pain began 2 days ago. Chest pain occurs constantly. The chest pain is unchanged. Primary symptoms include shortness of breath, palpitations, abdominal pain, nausea and dizziness. Pertinent negatives for primary symptoms include no fever and no vomiting.  The palpitations also occurred with dizziness and shortness of breath.  Dizziness also occurs with nausea and weakness. Dizziness does not occur with vomiting.   Associated symptoms include weakness.   Pt statse he was at home drinking alcohol, when developed palpatations, chest pain, SOB. States feels like previous Afib with RVR. States was recently, about 3 wks ago, admitted for the same. States supposed to be taking cardizem at home, but missed doses last two days because he was moving and misplaced medications. Pt states he feels nauseated.   Past Medical History  Diagnosis Date  . Atrial fibrillation     History reviewed. No pertinent past surgical history.  History reviewed. No pertinent family history.  History  Substance Use Topics  . Smoking status: Current Everyday Smoker -- 1.0 packs/day    Types: Cigarettes  . Smokeless tobacco: Not on file  . Alcohol Use: Yes     heavy drinker      Review of Systems  Constitutional: Negative for fever and chills.  HENT: Negative.   Eyes: Negative.   Respiratory: Positive for shortness of breath.   Cardiovascular: Positive for chest pain and palpitations. Negative for leg swelling.  Gastrointestinal: Positive for nausea and abdominal pain. Negative for vomiting and diarrhea.    Genitourinary: Negative.   Musculoskeletal: Negative.   Skin: Negative.   Neurological: Positive for dizziness and weakness.  Psychiatric/Behavioral: Negative.     Allergies  Review of patient's allergies indicates no known allergies.  Home Medications   Current Outpatient Rx  Name Route Sig Dispense Refill  . ASPIRIN 81 MG PO CHEW Oral Chew 81 mg by mouth daily.      Marland Kitchen DILTIAZEM HCL ER COATED BEADS 120 MG PO CP24 Oral Take 120 mg by mouth daily.        There were no vitals taken for this visit.  Physical Exam  Nursing note and vitals reviewed. Constitutional: He is oriented to person, place, and time. He appears well-developed and well-nourished.       Anxious appearing  HENT:  Head: Normocephalic and atraumatic.  Eyes: Conjunctivae are normal.  Neck: Normal range of motion. Neck supple.  Cardiovascular: S1 normal and S2 normal.  An irregular rhythm present. Tachycardia present.   Pulmonary/Chest: Effort normal and breath sounds normal. No respiratory distress.  Abdominal: Soft. Normal appearance and bowel sounds are normal. He exhibits no distension. There is tenderness in the epigastric area. There is no rebound and no CVA tenderness.  Musculoskeletal: Normal range of motion. He exhibits no edema.  Neurological: He is alert and oriented to person, place, and time.  Skin: Skin is warm and dry.  Psychiatric:       Depressed, tearful    ED Course  Procedures (including critical care time)  Pt with CP in Afib RVR, not taking his meds for 2 days, admits to alcohol "all day  every day."  HR in 140s. Cardizem started, bolus administered, will start a drip. Will monitor   Rate is now controlled. Will start back on cardizem at home. Pt states " I will be back tomorrow because I am not going to take my medications because I will be drinking."  Explained to pt although, it is his choice, he will feel much better if he takes his Cardizem instead of drinking alcohol.    Date:  03/07/2011  Rate: 147  Rhythm: atrial fibrillation with rapid ventricular response, PVCs  QRS Axis: normal, low voltage  Intervals: normal  ST/T Wave abnormalities: nonspecific T wave changes  Conduction Disutrbances:none  Narrative Interpretation:   Old EKG Reviewed: unchanged  Discussed with [facility] cardiology. Now rate controlled, presumably episode was due to pt not taking his medications. troponins negative. Will d/c home with outpatient follow up  12:36 AM Pt already discharged, now asking for detox, states otherwise he will continue to drink and he is worried about his health. Will call act.  Spoke with ACT, will come asess MDM          Lottie Mussel, PA 03/07/11 0024  Lottie Mussel, PA 03/07/11 1610

## 2011-03-06 NOTE — ED Notes (Signed)
Pt is here for afib. Has controled rate per ems. Pt is also co chest pains in the center of his chest. Pt is also nauseated. Pt tried to relieve pain with rest without success. He took 1 baby asa prior to ems arrival and they gave him 3 more. #20 in left hand with 250 ml's in.

## 2011-03-07 DIAGNOSIS — I4891 Unspecified atrial fibrillation: Principal | ICD-10-CM

## 2011-03-07 LAB — RAPID URINE DRUG SCREEN, HOSP PERFORMED
Amphetamines: NOT DETECTED
Barbiturates: NOT DETECTED
Tetrahydrocannabinol: NOT DETECTED

## 2011-03-07 LAB — CBC
HCT: 45.2 % (ref 39.0–52.0)
MCH: 32 pg (ref 26.0–34.0)
MCHC: 34.7 g/dL (ref 30.0–36.0)
MCV: 92.1 fL (ref 78.0–100.0)
RDW: 14.8 % (ref 11.5–15.5)

## 2011-03-07 LAB — CARDIAC PANEL(CRET KIN+CKTOT+MB+TROPI): CK, MB: 1.2 ng/mL (ref 0.3–4.0)

## 2011-03-07 MED ORDER — NICOTINE 21 MG/24HR TD PT24
21.0000 mg | MEDICATED_PATCH | Freq: Every day | TRANSDERMAL | Status: DC
  Administered 2011-03-07 – 2011-03-13 (×7): 21 mg via TRANSDERMAL
  Filled 2011-03-07 (×7): qty 1

## 2011-03-07 MED ORDER — ONDANSETRON HCL 4 MG PO TABS: 4.0000 mg | ORAL_TABLET | Freq: Three times a day (TID) | ORAL | Status: DC | PRN

## 2011-03-07 MED ORDER — ONDANSETRON HCL 4 MG/2ML IJ SOLN: 4.0000 mg | Freq: Four times a day (QID) | INTRAMUSCULAR | Status: DC | PRN

## 2011-03-07 MED ORDER — LORAZEPAM 2 MG/ML IJ SOLN: 1.0000 mg | Freq: Four times a day (QID) | INTRAMUSCULAR | Status: DC | PRN

## 2011-03-07 MED ORDER — ACETAMINOPHEN 325 MG PO TABS: 650.0000 mg | ORAL_TABLET | ORAL | Status: DC | PRN

## 2011-03-07 MED ORDER — ENOXAPARIN SODIUM 40 MG/0.4ML ~~LOC~~ SOLN
40.0000 mg | SUBCUTANEOUS | Status: DC
  Administered 2011-03-07 – 2011-03-12 (×6): 40 mg via SUBCUTANEOUS
  Filled 2011-03-07 (×8): qty 0.4

## 2011-03-07 MED ORDER — LORAZEPAM 0.5 MG PO TABS
0.0000 mg | ORAL_TABLET | Freq: Two times a day (BID) | ORAL | Status: AC
  Administered 2011-03-09 – 2011-03-10 (×2): 0.5 mg via ORAL

## 2011-03-07 MED ORDER — ADULT MULTIVITAMIN W/MINERALS CH
1.0000 | ORAL_TABLET | ORAL | Status: AC
  Administered 2011-03-07: 1 via ORAL
  Filled 2011-03-07 (×2): qty 1

## 2011-03-07 MED ORDER — VITAMIN B-1 100 MG PO TABS
100.0000 mg | ORAL_TABLET | ORAL | Status: AC
  Administered 2011-03-07: 100 mg via ORAL
  Filled 2011-03-07 (×2): qty 1

## 2011-03-07 MED ORDER — SODIUM CHLORIDE 0.9 % IJ SOLN
3.0000 mL | Freq: Two times a day (BID) | INTRAMUSCULAR | Status: DC
  Administered 2011-03-08 – 2011-03-12 (×10): 3 mL via INTRAVENOUS

## 2011-03-07 MED ORDER — DILTIAZEM HCL 100 MG IV SOLR
10.0000 mg/h | INTRAVENOUS | Status: AC
  Administered 2011-03-07: 10 mg/h via INTRAVENOUS
  Filled 2011-03-07: qty 100

## 2011-03-07 MED ORDER — ADULT MULTIVITAMIN W/MINERALS CH
1.0000 | ORAL_TABLET | Freq: Every day | ORAL | Status: DC
  Administered 2011-03-07 – 2011-03-13 (×7): 1 via ORAL
  Filled 2011-03-07 (×7): qty 1

## 2011-03-07 MED ORDER — THIAMINE HCL 100 MG/ML IJ SOLN: 100.0000 mg | INTRAMUSCULAR | Status: AC

## 2011-03-07 MED ORDER — ZOLPIDEM TARTRATE 5 MG PO TABS
5.0000 mg | ORAL_TABLET | Freq: Every evening | ORAL | Status: DC | PRN
  Administered 2011-03-07 – 2011-03-12 (×6): 5 mg via ORAL
  Filled 2011-03-07 (×6): qty 1

## 2011-03-07 MED ORDER — ASPIRIN 81 MG PO CHEW
81.0000 mg | CHEWABLE_TABLET | Freq: Every day | ORAL | Status: DC
  Administered 2011-03-07 – 2011-03-12 (×7): 81 mg via ORAL
  Filled 2011-03-07 (×6): qty 1

## 2011-03-07 MED ORDER — LORAZEPAM 0.5 MG PO TABS
1.0000 mg | ORAL_TABLET | Freq: Three times a day (TID) | ORAL | Status: DC | PRN
  Administered 2011-03-10 – 2011-03-13 (×6): 1 mg via ORAL
  Filled 2011-03-07 (×2): qty 1
  Filled 2011-03-07 (×2): qty 2
  Filled 2011-03-07 (×3): qty 1
  Filled 2011-03-07: qty 2
  Filled 2011-03-07: qty 1
  Filled 2011-03-07: qty 2
  Filled 2011-03-07: qty 1

## 2011-03-07 MED ORDER — SODIUM CHLORIDE 0.9 % IV SOLN: 250.0000 mL | INTRAVENOUS | Status: DC | PRN

## 2011-03-07 MED ORDER — NITROGLYCERIN 0.4 MG SL SUBL: 0.4000 mg | SUBLINGUAL_TABLET | SUBLINGUAL | Status: DC | PRN

## 2011-03-07 MED ORDER — FOLIC ACID 1 MG PO TABS
1.0000 mg | ORAL_TABLET | ORAL | Status: AC
  Administered 2011-03-07: 1 mg via ORAL
  Filled 2011-03-07: qty 1

## 2011-03-07 MED ORDER — SODIUM CHLORIDE 0.9 % IV BOLUS (SEPSIS)
1000.0000 mL | Freq: Once | INTRAVENOUS | Status: AC
  Administered 2011-03-07: 1000 mL via INTRAVENOUS

## 2011-03-07 MED ORDER — LORAZEPAM 0.5 MG PO TABS
0.0000 mg | ORAL_TABLET | Freq: Four times a day (QID) | ORAL | Status: AC
  Administered 2011-03-08 – 2011-03-09 (×5): 1 mg via ORAL
  Filled 2011-03-07 (×5): qty 2

## 2011-03-07 MED ORDER — DILTIAZEM HCL 30 MG PO TABS
30.0000 mg | ORAL_TABLET | Freq: Once | ORAL | Status: AC
  Administered 2011-03-07: 30 mg via ORAL
  Filled 2011-03-07: qty 1

## 2011-03-07 MED ORDER — VITAMIN B-1 100 MG PO TABS
100.0000 mg | ORAL_TABLET | Freq: Every day | ORAL | Status: DC
  Administered 2011-03-08 – 2011-03-13 (×6): 100 mg via ORAL
  Filled 2011-03-07 (×6): qty 1

## 2011-03-07 MED ORDER — SODIUM CHLORIDE 0.9 % IJ SOLN: 3.0000 mL | INTRAMUSCULAR | Status: DC | PRN

## 2011-03-07 MED ORDER — LORAZEPAM 0.5 MG PO TABS: 1.0000 mg | ORAL_TABLET | Freq: Four times a day (QID) | ORAL | Status: DC | PRN

## 2011-03-07 MED ORDER — THIAMINE HCL 100 MG/ML IJ SOLN
100.0000 mg | Freq: Every day | INTRAMUSCULAR | Status: DC
  Filled 2011-03-07 (×4): qty 1

## 2011-03-07 MED ORDER — DILTIAZEM HCL ER COATED BEADS 120 MG PO CP24
120.0000 mg | ORAL_CAPSULE | ORAL | Status: AC
  Administered 2011-03-07: 120 mg via ORAL
  Filled 2011-03-07: qty 1

## 2011-03-07 MED ORDER — LORAZEPAM 0.5 MG PO TABS
1.0000 mg | ORAL_TABLET | Freq: Four times a day (QID) | ORAL | Status: AC | PRN
  Administered 2011-03-07 (×2): 1 mg via ORAL
  Filled 2011-03-07: qty 2
  Filled 2011-03-07: qty 1

## 2011-03-07 MED ORDER — FOLIC ACID 1 MG PO TABS
1.0000 mg | ORAL_TABLET | Freq: Every day | ORAL | Status: DC
  Administered 2011-03-07 – 2011-03-13 (×7): 1 mg via ORAL
  Filled 2011-03-07 (×7): qty 1

## 2011-03-07 NOTE — ED Notes (Signed)
Report to monica

## 2011-03-07 NOTE — ED Provider Notes (Signed)
Pt has now had return of rapid rate with afib.  HR in 120s, 140s with movement or standing.  Pt has no tremors, seizures, is calm and cooperative.  I have consulted [facility] cardiology- they recommend 30mg  po diltiazem now, and to increase his long acting daily dose to 180mg .  I will also give him a fluid bolus.  They will see patient in the ED.    7:20 PM [facility] cardiology has seen patient and they are writing admission orders.  Ethelda Chick, MD 03/07/11 [phone]

## 2011-03-07 NOTE — ED Notes (Signed)
Received report from [place], [place]

## 2011-03-07 NOTE — ED Notes (Signed)
Informed patient and/or family of status. Awaiting acceptance/bed assignment. Pt remains pleasant, calm & cooperative

## 2011-03-07 NOTE — ED Provider Notes (Signed)
Recheck alert, content. Hr 94, rr 16. No tremors. Discussed w act team, they are continuing to work on placement.    Suzi Roots, MD 03/07/11 [phone]

## 2011-03-07 NOTE — ED Notes (Signed)
Pt concerned about his belongings being at the [facility] and today was the last day he could keep them there without paying more money.  Notified Thurston Hole in case management who spoke with social work.  Thurston Hole went to speak with pt and see what we could do to help.  Thurston Hole stated that when she entered the room pt was hanging up with the help line who called [facility] and they said that they would bag up his items and keep them in storage for up to 30 days.

## 2011-03-07 NOTE — ED Provider Notes (Signed)
This 65 year old male had a recent admission for atrial fibrillation and apparently was rate controlled with diltiazem and discharged, he does not take Coumadin, he does use alcohol, he states he ran out of his diltiazem over the last few days and has had increased palpitations for the last few days but no shortness of breath, lightheadedness, syncope, or focal neurologic symptoms, and no exertional chest pain just a constant sensation in his chest very 24 hours a day for the last few days making it feel like his atrial fibrillation is more rapid again, he arrived in rapid atrial fibrillation is now rate controlled again, I do not think emergent cardioversion either chemically or electronically are indicated at this time and I believe he is stable for outpatient follow up again to resume his diltiazem, the case was discussed with cardiology who agrees with this assessment and plan as well.  Medical screening examination/treatment/procedure(s) were conducted as a shared visit with non-physician practitioner(s) and myself.  I personally evaluated the patient during the encounter  Addendum 0030: When the patient was going to be discharged he brought up a new concern and wants alcohol detox which was not a part of his original presentation, so the ACT will be notified.  Hurman Horn, MD 03/10/11 2126

## 2011-03-07 NOTE — ED Notes (Signed)
Ate all of diet tray. Informed patient and/or family of status. No voiced complaints presently. NAD.

## 2011-03-07 NOTE — ED Notes (Signed)
Ordered pt lunch tray 

## 2011-03-07 NOTE — ED Notes (Signed)
Report received, assumed care. Pt arrived to room 27 ambulatory.

## 2011-03-07 NOTE — ED Notes (Signed)
Breakfast tray ordered 

## 2011-03-07 NOTE — ED Notes (Addendum)
Baxter Hire, ACT Team at bedside.  Called for diet tray at [phone]

## 2011-03-07 NOTE — ED Notes (Signed)
Pharmacy called notified of IV cardizem d/c'd and po needed pt VSS  Denies pain but requesting detox for ETOH  T[place]iana PA notifed

## 2011-03-07 NOTE — ED Notes (Signed)
WHEN PATIENT STANDS UP TO URINATE HIS HEART RATE ALWAYS GOES UP TO ATLEAST 140

## 2011-03-07 NOTE — ED Notes (Signed)
Call from ACT team [facility] willing to hold bed but request call in the AM r/t pt status at 0700 . Please call Okey Regal or Steward Drone at [phone]

## 2011-03-07 NOTE — ED Notes (Signed)
The pt is sitting on the side of the bed jittery.  Sitting with his head down he thinks he needs another ativan

## 2011-03-07 NOTE — Consult Note (Signed)
CARDIOLOGY CONSULT NOTE   Patient ID: [name] MRN: [id number] DOB/AGE: [birth year] 65 y.o.  Admit date: 03/06/2011  Primary Physician   No primary provider on file. Primary Cardiologist   [place] Reason for Consultation   Atrial Fib, poor rate control  [name] is a 65 y.o. male with no history of CAD.   He has a history of A. Fib and was last evaluated earlier in December of 2012 for CP/a. Fib. He was discharged on 12-4 and had an appointment with Tereso Newcomer, PA-C on December 19th, but misunderstood and did not show up. He has had continuous chest pain for at least one month. It is left-sided and a constant pressure at a 3 or 4/10. Worse with exhalation but there all the time. No change with exertion or position. His chest wall is tender.   He feels palpitations daily, especially in the afternoons or when walking/active. C/O orthostatic dizzyness but no presyncope or syncope.   Came to the ER yesterday for palpitations - worse than usual. Was treated (and released) but then expressed a wish for detox. He has been here since then awaiting admission to [facility]. His HR kept going up, into the 140s with getting up to urinate so cardiology was asked to evaluate him as he cannot go to [facility] with an uncontrolled heart rate. Pt currently feels exhausted.   Past Medical History  Diagnosis Date   Depression    ETOH abuse    Adenosine  Myoview:   no diagnostic electrocardiographic changes.  Note the patient was in atrial fibrillation during the study.  The scintigraphic results show inferior thinning but no ischemia.  The gated  ejection fraction was 61% and  wall  motion was normal. September 2010   Tobacco use   . Atrial fibrillation, paroxysmal     Surgical History: EGD 9/12 in [facility], right hand surgery after trauma   No Known Allergies  Medications I have reviewed the patient's current medications. Prior to Admission:  ASA 81mg  daily Cardizem CD 120mg   daily, (none in last 2 days)   Social History  . Marital Status: Divorced    Spouse Name: N/A    Number of Children: N/A  . Years of Education: N/A   Occupational History  . Retired Psychologist, counselling   Social History Main Topics  . Smoking status: Current Everyday Smoker -- 1.0 packs/day, >50 pack-year history    Types: Cigarettes  . Smokeless tobacco: Not on file  . Alcohol Use: Yes     heavy drinker  . Drug Use: No  . Sexually Active: No   Social History Narrative  . Currently living in a hotel, but losing his room today      Family history. Both parents are deceased, neither one with cardiac issues. No siblings with cardiac isssues.  ROS: He has sinus congestion, cough with occasional greenish sputum. No fevers or chills. No melena or hematemesis. Abdomen is tender on the upper right side, chronic. Mild sore throat at times.Full 14 point review of systems complete and found to be negative unless listed  above  Physical Exam: Blood pressure 106/59, pulse 140, resp. rate 16, SpO2 96.00%.   General: Well developed, well nourished, in mild acute distress Head: Eyes PERRLA, No xanthomas.   Normocephalic and atraumatic, oropharynx without edema or exudate. Lungs: Few rales in the bases, no crackles, wheeze or rhonci noted. Heart: Irregular rate and rhythm, S1 S2, no rub/gallop, no Murmur. pulses are 2+ &  equal all 4 extrem.   Neck:  No carotid bruit.   No lymphadenopathy. No JVD. Abdomen: Bowel sounds present, abdomen soft and tender RUQ, no rigidity or guarding. No masses or hernias noted. Msk:  No spine or cva tenderness. Normal strength and tone for age, no joint deformities or effusions. Extremities: No clubbing or cyanosis.  No edema.  Neuro: Alert and oriented X 3. No focal deficits noted. Psych: Depressed affect, responds appropriately Skin :  No rashes or lesions noted.   Labs:   Lab Results  Component Value Date   WBC 8.9 03/06/2011   HGB 16.3 03/06/2011   HCT 48.0  03/06/2011   MCV 91.1 03/06/2011   PLT 143* 03/06/2011    Lab 03/06/11 2236  NA 138  K 3.9  CL 105  CO2 24  BUN 12  CREATININE 0.79  CALCIUM 8.4  PROT 5.7*  BILITOT 0.3  ALKPHOS 93  ALT 11  AST 14  GLUCOSE 102*   No results found for this basename: CKTOTAL:4,CKMB:4,TROPONINI:4 in the last 72 hours Lab Results  Component Value Date   CHOL 129 01/04/2011   HDL 41 01/04/2011   LDLCALC 71 01/04/2011   TRIG 86 01/04/2011   No results found for this basename: DDIMER    Lipase  Date/Time Value Range Status  03/06/2011 10:36 PM 32  11-59 (U/L) Final   Radiology:  US Abdomen Complete 02/12/2011  *RADIOLOGY REPORT*  Clinical Data:  Abdominal pain, chest pain and history of hepatitis B infection and alcohol abuse.  ABDOMEN ULTRASOUND  Technique:  Complete abdominal ultrasound examination was performed including evaluation of the liver, gallbladder, bile ducts, pancreas, kidneys, spleen, IVC, and abdominal aorta.  Comparison:  10/04/2009  Findings:  Gallbladder:  No shadowing gallstones or echogenic sludge.  No gallbladder wall thickening or pericholecystic fluid.  Negative sonographic Murphy's sign according to the ultrasound technologist.  Common Bile Duct:  Normal caliber of 5 mm.  Liver:  Liver echotexture is mildly heterogeneous without obvious focal mass or intrahepatic biliary ductal dilatation.  The portal vein is open.  IVC:  Patent throughout its visualized course in the abdomen.  Pancreas:  Although the pancreas is difficult to visualize in its entirety, no focal pancreatic abnormality is identified.  Spleen:  The spleen shows normal echotexture and size.  Kidneys:  The right kidney measures 11.6 cm and the left kidney 12.0 cm. Both show no evidence of hydronephrosis or focal lesion. Right renal cortex is mildly echogenic, suggesting component of chronic disease.  Left renal cortex shows normal echogenicity.  Abdominal Aorta:  Abdominal aorta shows atherosclerotic changes. No  evidence of abdominal aortic aneurysm with maximal measured AP diameter of 2.8 cm.  IMPRESSION:  1.  No evidence of abdominal aortic aneurysm. 2.  Mildly increased echogenicity of the right renal cortex suggesting component of underlying chronic disease.  Original Report Authenticated By: Reola Calkins, M.D.   Dg Chest Portable 1 View 03/06/2011  *RADIOLOGY REPORT*  Clinical Data: Left lower chest pain for 5-6 days; shortness of breath and chronic cough.  Congestion and wheezing.  History of smoking.  PORTABLE CHEST - 1 VIEW  Comparison: Chest radiograph performed 02/11/2011  Findings: The lungs are well-aerated.  Chronic interstitial prominence is again noted.  There is no evidence of focal opacification, pleural effusion or pneumothorax.  The costophrenic angles are incompletely imaged on this study.  The heart is normal in size; the mediastinal contour is within normal limits.  No acute osseous abnormalities are seen.  IMPRESSION: No acute cardiopulmonary process seen; mild chronic lung changes again noted.  Original Report Authenticated By: Tonia Ghent, M.D.    EKG:  Afib/rvr, no acute ischemic changes.  ASSESSMENT AND PLAN:   The patient was seen today by Dr Antoine Poche, the patient evaluated and the data reviewed. He has atrial fib of unknown duration and needs detox. If he can be rate-controlled with cardizem (his dose in the past was 360mg  daily), no further cardiac workup is needed at this time. He should follow-up with cardiology as an outpatient. He could be given Cardizem CD 360 mg daily plus PRN Cardizem 30 mg to improve rate control.  Signed: Theodore Demark 03/07/2011, 5:30 PM Co-Sign MD  History reviewed with the patient, no changes to be made. He present with palpitations.  He also wants to go to cardiac rehab.  He has had atrial fib in the past with cardioversion in [place] but recurrent fib after he started EtOH.  He was admitted earlier this month with atrial fib.  He has a  low CHADs score.  In addition he is not a Coumadin candidate as he is using EtOH.  He is going to go to EtOH rehab when he is medically cleared The patient exam reveals a disheveled man in no distress.  Lungs clear. Heart Irregular rate and rhythm All available labs, radiology testing, previous records reviewed. Agree with documented assessment and plan.  He needs to be admitted for rate control and to resume his PO meds.  Overnight he will be on IV Cardizem.  He is not a coumadin candidate and therefore, not a candidate for DCCV.   Rollene Rotunda  6:32 PM 01/25/2011

## 2011-03-07 NOTE — BH Assessment (Addendum)
Assessment Note   [name] is an 65 y.o. male that was assessed this day.  Pt is requesting detox for alcohol.  Pt initially presented to ED due to chest pain.  Pt has been medically cleared.  Pt reported he has been drinking an 18 pk beer x 45 years.  Pt went to detox 2 years ago at [facility], but has not had any other treatment for SA or MH.  Pt does endorse sx of depression.  Pt denies SI/HI/psychosis.  Pt is voluntary and requesting help with detox.  Pt is calm, cooperative and has insight into his problem.  Called [facility] and bed available.  Gave phone referral to [facility] and faxed over labs as requested.  Shayla to call writer back once case reviewed with nurse.  Consulted with EDP Steinl who was in agreement with disposition.  Updated ED staff.  Completed assessment and assessment notification, faxed to [facility] to log.  Update @ 1320:  Received call from [facility] from [place] stating Dr. Betti Cruz declined pt at [facility] due to pt's AFib and PBt's and stated pt should be medically monitored in a telemetry unit while detoxing.  Consulted with EDP Steinl who stated pt was medically stable and he and writer agreed to try [facility] for possible placement.  Updated assessment,  Completed assessment notification, faxed to [facility] to run for possible admission.  Updated ED staff.  Update @ 1555:  Received call from [facility] stating pt was declined by NP Lynann Bologna, as she felt pt had already detoxed.  Updated EDP Steinl who agreed to try one more inpatient detox facility ([facility]) as they have beds.  Per EDP Steinl, if declined there, writer is to give pt OP SA referrals and referral to AA.  Called Carol at [facility] and bed available.  Completed referral form, updated assessment disposition, completed assessment notification form and faxed to [facility] as well as to [facility] to log.  Updated ED staff.  Update @ 1825:  Received update from EDP Linker that pt's heart rate increased and cardiology consult done.  Pt to be medically admitted.  Okey Regal accepted pt at  [facility], so Clinical research associate called back and informed [facility] that pt would not be coming due to medical admit.  Updated ED staff.  Updated assessment disposition, completed assessment notification and faxed to [facility] to log.    Axis I: Depressive Disorder NOS and Alcohol Dependence Axis II: Deferred Axis III:  Past Medical History  Diagnosis Date  . Atrial fibrillation    Axis IV: economic problems and problems with primary support group Axis V: 31-40 impairment in reality testing  Past Medical History:  Past Medical History  Diagnosis Date  . Atrial fibrillation     History reviewed. No pertinent past surgical history.  Family History: History reviewed. No pertinent family history.  Social History:  reports that he has been smoking Cigarettes.  He has been smoking about 1 pack per day. He does not have any smokeless tobacco history on file. He reports that he drinks alcohol. He reports that he does not use illicit drugs.  Additional Social History:  Alcohol / Drug Use Pain Medications: na Prescriptions: see list Over the Counter: see list History of alcohol / drug use?: Yes Substance #1 Name of Substance 1: ETOH 1 - Age of First Use: 16 1 - Amount (size/oz): 18 pk beer 1 - Frequency: daily 1 - Duration: 45 years 1 - Last Use / Amount: yesterday - 18 pk beer Allergies: No Known Allergies  Home Medications:  Medications Prior to Admission  Medication Dose Route Frequency Provider Last Rate Last Dose  . acetaminophen (TYLENOL) tablet 650 mg  650 mg Oral Q4H PRN Tatyana A Kirichenko, PA      . aspirin chewable tablet 81 mg  81 mg Oral Daily Tatyana A Kirichenko, PA   81 mg at 03/07/11 1030  . diltiazem (CARDIZEM CD) 24 hr capsule 120 mg  120 mg Oral To Major Tatyana A Kirichenko, PA   120 mg at 03/07/11 0244  . diltiazem (CARDIZEM) 100 mg in dextrose 5 % 100 mL infusion  5-15 mg/hr Intravenous Once Tatyana A Kirichenko, PA   5 mg/hr at 03/06/11 2153  . folic acid (FOLVITE) tablet 1 mg  1 mg  Oral To Major Tatyana A Kirichenko, PA   1 mg at 03/07/11 0457  . LORazepam (ATIVAN) injection 0.5 mg  0.5 mg Intravenous Once Tatyana A Kirichenko, PA   0.5 mg at 03/07/11 0027  . LORazepam (ATIVAN) tablet 1 mg  1 mg Oral Q8H PRN Tatyana A Kirichenko, PA      . LORazepam (ATIVAN) tablet 1 mg  1 mg Oral Q6H PRN Tatyana A Kirichenko, PA      . mulitivitamin with minerals tablet 1 tablet  1 tablet Oral To Major Tatyana A Kirichenko, PA   1 tablet at 03/07/11 0457  . nicotine (NICODERM CQ - dosed in mg/24 hours) patch 21 mg  21 mg Transdermal Daily Tatyana A Kirichenko, PA   21 mg at 03/07/11 1035  . ondansetron (ZOFRAN) tablet 4 mg  4 mg Oral Q8H PRN Tatyana A Kirichenko, PA      . sodium chloride 0.9 % bolus 1,000 mL  1,000 mL Intravenous Once Tatyana A Kirichenko, PA   1,000 mL at 03/06/11 2155  . sodium chloride 0.9 % bolus 1,000 mL  1,000 mL Intravenous Once Hurman Horn, MD   1,000 mL at 03/07/11 0017  . thiamine (VITAMIN B-1) tablet 100 mg  100 mg Oral To Major Tatyana A Kirichenko, PA   100 mg at 03/07/11 0457   Or  . thiamine (B-1) injection 100 mg  100 mg Intravenous To Major Tatyana A Kirichenko, PA      . zolpidem (AMBIEN) tablet 5 mg  5 mg Oral QHS PRN Tatyana A Kirichenko, PA      . DISCONTD: LORazepam (ATIVAN) injection 1 mg  1 mg Intravenous Q6H PRN Tatyana A Kirichenko, PA       Medications Prior to Admission  Medication Sig Dispense Refill  . aspirin 81 MG chewable tablet Chew 81 mg by mouth daily.        Marland Kitchen diltiazem (CARDIZEM CD) 120 MG 24 hr capsule Take 120 mg by mouth daily.          OB/GYN Status:  No LMP for male patient.  General Assessment Data Location of Assessment: [facility] ED Living Arrangements: Alone Can pt return to current living arrangement?: Yes Admission Status: Voluntary Is patient capable of signing voluntary admission?: Yes Transfer from: Acute Hospital Referral Source: Self/Family/Friend  Education Status Is patient currently in school?: No  Risk to  self Suicidal Ideation: No Suicidal Intent: No Is patient at risk for suicide?: No Suicidal Plan?: No Access to Means: No What has been your use of drugs/alcohol within the last 12 months?:  (Daily use of ETOH) Previous Attempts/Gestures: No How many times?: 0  Other Self Harm Risks:  (n/a) Triggers for Past Attempts: Other (Comment) (n/a) Intentional Self  Injurious Behavior: None Family Suicide History: No Recent stressful life event(s): Financial Problems;Other (Comment) (medical problems) Persecutory voices/beliefs?: No Depression: Yes Depression Symptoms: Despondent;Insomnia;Isolating;Guilt;Loss of interest in usual pleasures;Feeling worthless/self pity Substance abuse history and/or treatment for substance abuse?: Yes Suicide prevention information given to non-admitted patients: Not applicable  Risk to Others Homicidal Ideation: No Thoughts of Harm to Others: No Current Homicidal Intent: No Current Homicidal Plan: No Access to Homicidal Means: No Identified Victim:  (n/a) History of harm to others?: No Assessment of Violence: None Noted Violent Behavior Description:  (n/a - pt calm, cooperative) Does patient have access to weapons?: No Criminal Charges Pending?: No Does patient have a court date: No  Psychosis Hallucinations: None noted Delusions: None noted  Mental Status Report Appear/Hygiene: Other (Comment) (Casual) Eye Contact: Fair Motor Activity: Unremarkable Speech: Logical/coherent Level of Consciousness: Alert Mood: Depressed Affect: Sullen Anxiety Level: Moderate Thought Processes: Coherent;Relevant Judgement: Impaired Orientation: Person;Place;Time;Situation Obsessive Compulsive Thoughts/Behaviors: None  Cognitive Functioning Concentration: Normal Memory: Recent Intact;Remote Intact IQ: Average Impulse Control: Poor Appetite: Poor Weight Loss: 0  Weight Gain: 0  Sleep: Decreased Total Hours of Sleep: 4  Vegetative Symptoms: None  Prior  Inpatient Therapy Prior Inpatient Therapy: Yes Prior Therapy Dates: 2010 Prior Therapy Facilty/Provider(s): [facility] Reason for Treatment: detox  Prior Outpatient Therapy Prior Outpatient Therapy: No Prior Therapy Dates:  (n/a) Prior Therapy Facilty/Provider(s): n/a Reason for Treatment: n/a  ADL Screening (condition at time of admission) Patient's cognitive ability adequate to safely complete daily activities?: Yes Patient able to express need for assistance with ADLs?: Yes Independently performs ADLs?: Yes       Abuse/Neglect Assessment (Assessment to be complete while patient is alone) Physical Abuse: Denies Verbal Abuse: Denies Sexual Abuse: Denies Exploitation of patient/patient's resources: Denies Self-Neglect: Denies Values / Beliefs Cultural Requests During Hospitalization: None Spiritual Requests During Hospitalization: None Consults Spiritual Care Consult Needed: No Social Work Consult Needed: No Merchant navy officer (For Healthcare) Advance Directive: Patient does not have advance directive;Patient would not like information    Additional Information 1:1 In Past 12 Months?: No CIRT Risk: No Elopement Risk: No Does patient have medical clearance?: Yes     Disposition:  Disposition Disposition of Patient: Referred to;Inpatient treatment program ([facility]) Type of inpatient treatment program: Adult Patient referred to: [facility]  On Site Evaluation by:   Reviewed with Physician:  Valene Bors, Rennis Harding 03/07/2011 11:01 AM

## 2011-03-07 NOTE — Progress Notes (Signed)
I spoke with patient about his belongings. He informed me that he called the patient HELP line. They contacted the [facility] who agreed to bag up his belongings and place them in storage for up to 30 days. I also mentioned contacting his sister to pick up his belongings. Patient stated he wanted to wait until he knows what his hospitalization will involve.

## 2011-03-07 NOTE — ED Notes (Signed)
Per report pt waiting 4700 bed for cardiology observation and has [facility] bed once cleared by cardiology. Possible discharge in the AM per cardiology who request [facility] bed to be held until then.

## 2011-03-08 ENCOUNTER — Encounter (HOSPITAL_COMMUNITY): Payer: Self-pay | Admitting: General Practice

## 2011-03-08 LAB — BASIC METABOLIC PANEL
CO2: 27 mEq/L (ref 19–32)
Calcium: 8.9 mg/dL (ref 8.4–10.5)
Chloride: 104 mEq/L (ref 96–112)
Glucose, Bld: 92 mg/dL (ref 70–99)
Sodium: 138 mEq/L (ref 135–145)

## 2011-03-08 LAB — CBC
HCT: 41.2 % (ref 39.0–52.0)
Hemoglobin: 14.3 g/dL (ref 13.0–17.0)
MCH: 31.8 pg (ref 26.0–34.0)
MCV: 91.8 fL (ref 78.0–100.0)
Platelets: 129 10*3/uL — ABNORMAL LOW (ref 150–400)
RBC: 4.49 MIL/uL (ref 4.22–5.81)

## 2011-03-08 LAB — CARDIAC PANEL(CRET KIN+CKTOT+MB+TROPI)
CK, MB: 1.2 ng/mL (ref 0.3–4.0)
Total CK: 31 U/L (ref 7–232)
Total CK: 33 U/L (ref 7–232)

## 2011-03-08 MED ORDER — DILTIAZEM HCL 100 MG IV SOLR
5.0000 mg/h | Freq: Once | INTRAVENOUS | Status: AC
  Administered 2011-03-08: 10 mg/h via INTRAVENOUS
  Filled 2011-03-08: qty 100

## 2011-03-08 MED ORDER — DILTIAZEM HCL ER COATED BEADS 360 MG PO CP24
360.0000 mg | ORAL_CAPSULE | Freq: Every day | ORAL | Status: DC
  Administered 2011-03-08 – 2011-03-09 (×2): 360 mg via ORAL
  Filled 2011-03-08 (×2): qty 1

## 2011-03-08 NOTE — Progress Notes (Signed)
Patient ID: [name], male   DOB: 1945/06/19, 65 y.o.   MRN: [id number] Subjective:  No chest pain. Rare palpitations.  Objective:  Vital Signs in the last 24 hours: Temp:  [97.9 F (36.6 C)-98.2 F (36.8 C)] 98.2 F (36.8 C) (12/28 0556) Pulse Rate:  [51-140] 98  (12/28 0556) Resp:  [16-20] 20  (12/28 0556) BP: (106-131)/(59-93) 116/79 mmHg (12/28 0556) SpO2:  [95 %-100 %] 97 % (12/28 0556) Weight:  [95.5 kg (210 lb 8.6 oz)] 210 lb 8.6 oz (95.5 kg) (12/27 2200)  Intake/Output from previous day: 12/27 0701 - 12/28 0700 In: 276.8 [P.O.:120; I.V.:156.8] Out: -  Intake/Output from this shift: Total I/O In: 480 [P.O.:480] Out: -   Physical Exam: Chronically ill appearing NAD HEENT: Unremarkable Neck:  No JVD, no thyromegally Lymphatics:  No adenopathy Back:  No CVA tenderness Lungs:  Clear with no wheezes. HEART:  IRegular rate rhythm, no murmurs, no rubs, no clicks Abd:  Flat, positive bowel sounds, no organomegally, no rebound, no guarding Ext:  2 plus pulses, no edema, no cyanosis, no clubbing Skin:  No rashes no nodules Neuro:  CN II through XII intact, motor grossly intact  Lab Results:  Basename 03/08/11 0515 03/07/11 2215  WBC 8.7 11.0*  HGB 14.3 15.7  PLT 129* 147*    Basename 03/08/11 0515 03/07/11 2215 03/06/11 2236  NA 138 -- 138  K 4.1 -- 3.9  CL 104 -- 105  CO2 27 -- 24  GLUCOSE 92 -- 102*  BUN 14 -- 12  CREATININE 0.97 1.05 --    Basename 03/08/11 0515 03/07/11 2214  TROPONINI <0.30 <0.30   Hepatic Function Panel  Basename 03/06/11 2236  PROT 5.7*  ALBUMIN 3.1*  AST 14  ALT 11  ALKPHOS 93  BILITOT 0.3  BILIDIR --  IBILI --   No results found for this basename: CHOL in the last 72 hours No results found for this basename: PROTIME in the last 72 hours  Imaging: Dg Chest Portable 1 View  03/06/2011  *RADIOLOGY REPORT*  Clinical Data: Left lower chest pain for 5-6 days; shortness of breath and chronic cough.  Congestion and  wheezing.  History of smoking.  PORTABLE CHEST - 1 VIEW  Comparison: Chest radiograph performed 02/11/2011  Findings: The lungs are well-aerated.  Chronic interstitial prominence is again noted.  There is no evidence of focal opacification, pleural effusion or pneumothorax.  The costophrenic angles are incompletely imaged on this study.  The heart is normal in size; the mediastinal contour is within normal limits.  No acute osseous abnormalities are seen.  IMPRESSION: No acute cardiopulmonary process seen; mild chronic lung changes again noted.  Original Report Authenticated By: Tonia Ghent, M.D.    Cardiac Studies:  Assessment/Plan: 1. Atrial fib - will continue IV cardizem and add po cardizem for rate control. He is not a coumadin candidate with his h/o ETOH abuse. 2. Depression - he is pending a visit to behavioral health once his ventricular rate is controlled.     LOS: 2 days    Lewayne Bunting 03/08/2011, 11:40 AM

## 2011-03-08 NOTE — Plan of Care (Signed)
Problem: Consults Goal: Tobacco Cessation referral if indicated Outcome: Adequate for Discharge Using Nicotine patch

## 2011-03-09 ENCOUNTER — Other Ambulatory Visit: Payer: Self-pay

## 2011-03-09 LAB — BASIC METABOLIC PANEL
BUN: 16 mg/dL (ref 6–23)
CO2: 27 mEq/L (ref 19–32)
Chloride: 103 mEq/L (ref 96–112)
Creatinine, Ser: 1.01 mg/dL (ref 0.50–1.35)
Glucose, Bld: 87 mg/dL (ref 70–99)
Potassium: 4 mEq/L (ref 3.5–5.1)

## 2011-03-09 LAB — CBC
HCT: 42.2 % (ref 39.0–52.0)
Hemoglobin: 14.2 g/dL (ref 13.0–17.0)
MCHC: 33.6 g/dL (ref 30.0–36.0)
MCV: 93.2 fL (ref 78.0–100.0)
RDW: 14.7 % (ref 11.5–15.5)

## 2011-03-09 MED ORDER — DILTIAZEM HCL 30 MG PO TABS: 280.0000 mg | ORAL_TABLET | Freq: Every day | ORAL | Status: DC

## 2011-03-09 MED ORDER — DILTIAZEM HCL ER COATED BEADS 240 MG PO CP24
240.0000 mg | ORAL_CAPSULE | Freq: Every day | ORAL | Status: DC
  Administered 2011-03-10 – 2011-03-13 (×4): 240 mg via ORAL
  Filled 2011-03-09 (×4): qty 1

## 2011-03-09 NOTE — Progress Notes (Signed)
Patient ID: [name], male   DOB: Sep 14, 1945, 65 y.o.   MRN: [id number]    SUBJECTIVE: HR controlled this am, telemetry shows atrial fibrillation in the 50s-60s.  Feels tired.  Not shaky/tremulous.    Filed Vitals:   03/08/11 1355 03/08/11 1600 03/08/11 2134 03/09/11 0412  BP: 116/73 114/78 100/70 103/70  Pulse: 85 90 68 71  Temp: 97.3 F (36.3 C)  98 F (36.7 C) 97.4 F (36.3 C)  TempSrc: Oral     Resp: 20  24 20   Height:      Weight:    93.6 kg (206 lb 5.6 oz)  SpO2: 96%  96% 92%    Intake/Output Summary (Last 24 hours) at 03/09/11 0713 Last data filed at 03/09/11 0641  Gross per 24 hour  Intake    960 ml  Output   1300 ml  Net   -340 ml    LABS: Basic Metabolic Panel:  Basename 03/08/11 0515 03/07/11 2215 03/06/11 2236  NA 138 -- 138  K 4.1 -- 3.9  CL 104 -- 105  CO2 27 -- 24  GLUCOSE 92 -- 102*  BUN 14 -- 12  CREATININE 0.97 1.05 --  CALCIUM 8.9 -- 8.4  MG -- -- --  PHOS -- -- --   Liver Function Tests:  [facility] 03/06/11 2236  AST 14  ALT 11  ALKPHOS 93  BILITOT 0.3  PROT 5.7*  ALBUMIN 3.1*    Basename 03/06/11 2236  LIPASE 32  AMYLASE --   CBC:  Basename 03/09/11 0500 03/08/11 0515 03/06/11 2150  WBC 7.5 8.7 --  NEUTROABS -- -- 4.6  HGB 14.2 14.3 --  HCT 42.2 41.2 --  MCV 93.2 91.8 --  PLT 129* 129* --   Cardiac Enzymes:  Basename 03/08/11 0515 03/07/11 2214 03/07/11  CKTOTAL 31 37 33  CKMB 1.3 1.2 1.2  CKMBINDEX -- -- --  TROPONINI <0.30 <0.30 <0.30       . aspirin  81 mg Oral Daily  . diltiazem  360 mg Oral Daily  . diltiazem (CARDIZEM) infusion  5-15 mg/hr Intravenous Once  . enoxaparin  40 mg Subcutaneous Q24H  . folic acid  1 mg Oral Daily  . LORazepam  0-4 mg Oral Q6H   Followed by  . LORazepam  0-4 mg Oral Q12H  . mulitivitamin with minerals  1 tablet Oral Daily  . nicotine  21 mg Transdermal Daily  . sodium chloride  3 mL Intravenous Q12H  . thiamine  100 mg Oral Daily   Or  . thiamine  100 mg Intravenous  Daily    PHYSICAL EXAM General: NAD Neck: No JVD, no thyromegaly or thyroid nodule.  Lungs: Clear to auscultation bilaterally with normal respiratory effort. CV: Nondisplaced PMI.  Heart regular S1/S2, no S3/S4, no murmur.  No peripheral edema.  No carotid bruit.  Normal pedal pulses.  Abdomen: Soft, nontender, no hepatosplenomegaly, no distention.  Neurologic: Alert and oriented x 3, not tremulous Psych: Normal affect. Extremities: No clubbing or cyanosis.   ASSESSMENT AND PLAN:  65 yo with history of paroxysmal atrial fibrillation and ETOH abuse has been trying to get into ETOH rehab but was found to be in atrial fibrillation with RVR.  Recent echo with normal EF.  1. Atrial fibrillation: Now rate controlled on Cardizem CD. Has been deemed not to be coumadin candidate due to ETOH.  He is on ASA.   2. ETOH abuse: CIWA protocol.  No signs of significant withdrawal.  Ready for discharge to [facility], will ask social work to look into this.   Marca Ancona 03/09/2011 7:17 AM

## 2011-03-09 NOTE — Progress Notes (Signed)
Assessment completed.  Pt interested in [facility], whom the ACT team had contacted while in ED.  RN requested to get psych consult for recommendations. Milus Banister MSW,LCSW w/e Coverage [phone]

## 2011-03-10 LAB — CBC
HCT: 43.5 % (ref 39.0–52.0)
MCV: 93.3 fL (ref 78.0–100.0)
RBC: 4.66 MIL/uL (ref 4.22–5.81)
WBC: 8.6 10*3/uL (ref 4.0–10.5)

## 2011-03-10 MED ORDER — MIRTAZAPINE 15 MG PO TABS
15.0000 mg | ORAL_TABLET | Freq: Every day | ORAL | Status: DC
  Administered 2011-03-10 – 2011-03-12 (×3): 15 mg via ORAL
  Filled 2011-03-10 (×5): qty 1

## 2011-03-10 NOTE — Consult Note (Signed)
Reason for Consult:depression and alcohol dependence Referring Physician: Tereso Newcomer, PA  [name] is an 65 y.o. male.  HPI: This is a 65 years old, divorced, Caucasian male was admitted to [facility] cone medical unit for atrial fibrillation, alcohol dependence and depression. Patient reportedly suffering with the loneliness, depressed, sad, unhappy, financial difficulties, unemployment and has a poor psychosocial support. Patient reportedly is drinking alcohol since he was age 72 years, recently his alcohol intake was increased, his last use was 18 pack beer before coming to the hospital. Patient requested treatment for atrial fibrillation, detox of for alcohol and depression. He was refused admission to [facility] because of medically unstable. Patient is interested to be admitted to [facility] in [place]. Patient has no previous history of for rehabilitation treatments. He has no history of acute psychiatric hospitalizations. He is interested in taking medication to calm him down, less depressed, less anxious and to have better sleep.  Patient was divorced in 38 and has 2 children 42 years old daughter and a 48 years old son. Patient reported both of them are grown up and have relationships in their life and were supportive to him when needed. Patient reported no family history of psychiatric or mental health conditions.   Patient appeared lying down in his bed who head end was elevated. He was awake, alert, oriented to time place, person and situation. He has depressed mood with the constricted affect. He also has a internal anxiety. He has denied suicidal, homicidal ideation, intentions and plans. He has no evidence of psychotic symptoms. He has fair insight, judgment and impulse control.  Past Medical History  Diagnosis Date  . Atrial fibrillation   . Angina   . Shortness of breath   . Hepatitis     hepatitis B    Past Surgical History  Procedure Date  . Mouth surgery      History reviewed. No pertinent family history.  Social History:  reports that he has been smoking Cigarettes.  He has been smoking about 1 pack per day. He does not have any smokeless tobacco history on file. He reports that he drinks alcohol. He reports that he does not use illicit drugs.  Allergies: No Known Allergies  Medications: I have reviewed the patient's current medications.  Results for orders placed during the hospital encounter of 03/06/11 (from the past 48 hour(s))  CBC     Status: Abnormal   Collection Time   03/09/11  5:00 AM      Component Value Range Comment   WBC 7.5  4.0 - 10.5 (K/uL)    RBC 4.53  4.22 - 5.81 (MIL/uL)    Hemoglobin 14.2  13.0 - 17.0 (g/dL)    HCT 13.0  86.5 - 78.4 (%)    MCV 93.2  78.0 - 100.0 (fL)    MCH 31.3  26.0 - 34.0 (pg)    MCHC 33.6  30.0 - 36.0 (g/dL)    RDW 69.6  29.5 - 28.4 (%)    Platelets 129 (*) 150 - 400 (K/uL)   BASIC METABOLIC PANEL     Status: Abnormal   Collection Time   03/09/11  6:00 AM      Component Value Range Comment   Sodium 138  135 - 145 (mEq/L)    Potassium 4.0  3.5 - 5.1 (mEq/L)    Chloride 103  96 - 112 (mEq/L)    CO2 27  19 - 32 (mEq/L)    Glucose, Bld 87  70 - 99 (mg/dL)    BUN 16  6 - 23 (mg/dL)    Creatinine, Ser 1.61  0.50 - 1.35 (mg/dL)    Calcium 8.9  8.4 - 10.5 (mg/dL)    GFR calc non Af Amer 77 (*) >90 (mL/min)    GFR calc Af Amer 89 (*) >90 (mL/min)   CBC     Status: Abnormal   Collection Time   03/10/11  6:29 AM      Component Value Range Comment   WBC 8.6  4.0 - 10.5 (K/uL)    RBC 4.66  4.22 - 5.81 (MIL/uL)    Hemoglobin 14.8  13.0 - 17.0 (g/dL)    HCT 09.6  04.5 - 40.9 (%)    MCV 93.3  78.0 - 100.0 (fL)    MCH 31.8  26.0 - 34.0 (pg)    MCHC 34.0  30.0 - 36.0 (g/dL)    RDW 81.1  91.4 - 78.2 (%)    Platelets 126 (*) 150 - 400 (K/uL)     No results found.  No psychosis Blood pressure 105/76, pulse 74, temperature 97.4 F (36.3 C), temperature source Oral, resp. rate 18, height 6'  5" (1.956 m), weight 93.6 kg (206 lb 5.6 oz), SpO2 95.00%.   Assessment/Plan: Depression secondary to his substance abuse  Alcohol dependence and alcohol withdrawal Will start Remeron 15 mg 1 pill at bedtime for sleep and depression He may go to [facility] upon medically cleared    [name],JANARDHAHA R. 03/10/2011, 2:05 PM

## 2011-03-10 NOTE — ED Provider Notes (Signed)
Medical screening examination/treatment/procedure(s) were conducted as a shared visit with non-physician practitioner(s) and myself.  I personally evaluated the patient during the encounter  Jeffrey Horn, MD 03/10/11 2126

## 2011-03-10 NOTE — Progress Notes (Signed)
    SUBJECTIVE: No complaints.  Still in atrial fibrillation but rate controlled.   Telemetry: Atrial fibrillation in the 70s   Filed Vitals:   03/09/11 1500 03/09/11 1900 03/09/11 2117 03/10/11 0525  BP: 96/62 116/62 96/60 105/76  Pulse: 62 72 61 74  Temp: 97.3 F (36.3 C)  98.7 F (37.1 C) 97.4 F (36.3 C)  TempSrc: Oral  Oral Oral  Resp: 20  18 18   Height:      Weight:    93.6 kg (206 lb 5.6 oz)  SpO2: 95%  96% 95%    Intake/Output Summary (Last 24 hours) at 03/10/11 0854 Last data filed at 03/10/11 0037  Gross per 24 hour  Intake    960 ml  Output    950 ml  Net     10 ml    LABS: Basic Metabolic Panel:  Basename 03/09/11 0600 03/08/11 0515  NA 138 138  K 4.0 4.1  CL 103 104  CO2 27 27  GLUCOSE 87 92  BUN 16 14  CREATININE 1.01 0.97  CALCIUM 8.9 8.9  MG -- --  PHOS -- --   Liver Function Tests: No results found for this basename: AST:2,ALT:2,ALKPHOS:2,BILITOT:2,PROT:2,ALBUMIN:2 in the last 72 hours No results found for this basename: LIPASE:2,AMYLASE:2 in the last 72 hours CBC:  Basename 03/10/11 0629 03/09/11 0500  WBC 8.6 7.5  NEUTROABS -- --  HGB 14.8 14.2  HCT 43.5 42.2  MCV 93.3 93.2  PLT 126* 129*   Cardiac Enzymes:  Basename 03/08/11 0515 03/07/11 2214  CKTOTAL 31 37  CKMB 1.3 1.2  CKMBINDEX -- --  TROPONINI <0.30 <0.30    PHYSICAL EXAM General: NAD Neck: No JVD, no thyromegaly or thyroid nodule.  Lungs: Clear to auscultation bilaterally with normal respiratory effort. CV: Nondisplaced PMI.  Heart regular S1/S2, no S3/S4, no murmur.  No peripheral edema.  No carotid bruit.  Normal pedal pulses.  Abdomen: Soft, nontender, no hepatosplenomegaly, no distention.  Neurologic: Alert and oriented x 3, not tremulous Psych: Normal affect. Extremities: No clubbing or cyanosis.   ASSESSMENT AND PLAN:  65 yo with history of paroxysmal atrial fibrillation and ETOH abuse has been trying to get into ETOH rehab but was found to be in atrial  fibrillation with RVR.  Recent echo with normal EF.  1. Atrial fibrillation: Now rate controlled on Cardizem CD. Has been deemed not to be coumadin candidate due to ETOH.  He is on ASA.   2. ETOH abuse: CIWA protocol.  No signs of significant withdrawal.  Ready for discharge to [facility], social work is working on this.  We will call for psychiatry consult.   Marca Ancona  03/10/2011 8:56 AM

## 2011-03-11 LAB — CBC
HCT: 46.3 % (ref 39.0–52.0)
Hemoglobin: 15.6 g/dL (ref 13.0–17.0)
MCH: 31.3 pg (ref 26.0–34.0)
MCHC: 33.7 g/dL (ref 30.0–36.0)
MCV: 93 fL (ref 78.0–100.0)

## 2011-03-11 MED ORDER — ASPIRIN 81 MG PO CHEW: CHEWABLE_TABLET | ORAL | Status: DC

## 2011-03-11 MED ORDER — ADULT MULTIVITAMIN W/MINERALS CH: 1.0000 | ORAL_TABLET | Freq: Every day | ORAL | Status: DC

## 2011-03-11 MED ORDER — DILTIAZEM HCL ER COATED BEADS 240 MG PO CP24: 240.0000 mg | ORAL_CAPSULE | Freq: Every day | ORAL | Status: DC

## 2011-03-11 MED ORDER — ASPIRIN 81 MG PO TBEC: 81.0000 mg | DELAYED_RELEASE_TABLET | Freq: Every day | ORAL | Status: DC

## 2011-03-11 NOTE — Progress Notes (Signed)
Patient ID: [name], male   DOB: Jul 13, 1945, 65 y.o.   MRN: [id number]    SUBJECTIVE: No complaints.  Still in atrial fibrillation but rate controlled.     Filed Vitals:   03/10/11 0525 03/10/11 1300 03/10/11 2142 03/11/11 0506  BP: 105/76 112/71 108/77 111/72  Pulse: 74 64 77 68  Temp: 97.4 F (36.3 C) 97.4 F (36.3 C) 98.4 F (36.9 C) 97.3 F (36.3 C)  TempSrc: Oral Oral Oral   Resp: 18 18 18 20   Height:      Weight: 206 lb 5.6 oz (93.6 kg)   201 lb 11.2 oz (91.491 kg)  SpO2: 95% 95% 95% 96%    Intake/Output Summary (Last 24 hours) at 03/11/11 4098 Last data filed at 03/11/11 0511  Gross per 24 hour  Intake    240 ml  Output   1400 ml  Net  -1160 ml    LABS: Basic Metabolic Panel:  Basename 03/09/11 0600  NA 138  K 4.0  CL 103  CO2 27  GLUCOSE 87  BUN 16  CREATININE 1.01  CALCIUM 8.9  MG --  PHOS --   Liver Function Tests: No results found for this basename: AST:2,ALT:2,ALKPHOS:2,BILITOT:2,PROT:2,ALBUMIN:2 in the last 72 hours No results found for this basename: LIPASE:2,AMYLASE:2 in the last 72 hours CBC:  Basename 03/11/11 0500 03/10/11 0629  WBC 7.5 8.6  NEUTROABS -- --  HGB 15.6 14.8  HCT 46.3 43.5  MCV 93.0 93.3  PLT 139* 126*   Cardiac Enzymes: No results found for this basename: CKTOTAL:3,CKMB:3,CKMBINDEX:3,TROPONINI:3 in the last 72 hours  PHYSICAL EXAM General: NAD Neck: No JVD, no thyromegaly or thyroid nodule.  Lungs: Clear to auscultation bilaterally with normal respiratory effort. CV: Nondisplaced PMI.  Heart irregular S1/S2, no S3/S4, no murmur.  No peripheral edema.  No carotid bruit.  Normal pedal pulses.  Abdomen: Soft, nontender, no hepatosplenomegaly, no distention.  Neurologic: Alert and oriented x 3, not tremulous Psych: Normal affect. Extremities: No clubbing or cyanosis.   ASSESSMENT AND PLAN:  65 yo with history of paroxysmal atrial fibrillation and ETOH abuse has been trying to get into ETOH rehab but was found  to be in atrial fibrillation with RVR.  Recent echo with normal EF.  1. Atrial fibrillation: Now rate controlled on Cardizem CD. Has been deemed not to be coumadin candidate due to ETOH.  He is on ASA.   2. ETOH abuse: CIWA protocol.  No signs of significant withdrawal.  Ready for discharge to Behavioral Health if bed available.  [name]  03/11/2011 8:08 AM

## 2011-03-11 NOTE — Discharge Summary (Signed)
See progress notes [name]  

## 2011-03-11 NOTE — Progress Notes (Signed)
Pt smokes 1 1/2 ppd. Interested in patches. Action stage. Recommended two patches for a total of 35 mg to start out with and gradually taper down. Wrote down patch used instructions for pt and reviewed with him. Referred to 1-800 quit now for f/u and support. Discussed oral fixation substitutes, second hand smoke and in home smoking policy. Reviewed and gave pt Written education/contact information.

## 2011-03-11 NOTE — Discharge Summary (Signed)
Discharge Summary   Patient ID: [name] MRN: [id number], DOB/AGE: 1945/06/01 65 y.o. Admit date: 03/06/2011 D/C date:     03/11/2011   Primary Discharge Diagnoses:  1. Atrial fibrillation - RVR this admission - strategy is currently for rate control - not felt to be a coumadin candidate with hx noncompliance/EtOH abuse 2. Chest pain, felt atypical - ruled out for MI this admission - nuc 11/2008 with inferior thinning but no ischemia, normal EF 61% 3. Depression 4. Alcohol abuse 5. Tobacco abuse  Hospital Course: 65 y/o M with history of atrial fibrillation was in the hospital earlier this month for CP/afib. He was discharged 12/4 and had an appointment at our office 12/19 but misunderstood and did not show up. He returned with chest pain/ palpitations. The CP had been continuous for a month, constant, worse with exhalation, no change with exertion or position but increased with chest wall palpation. He also noted palpitations worse than usual. He also expressed a desire to go to detox. His HR increased to the 140s with activity and cardiology was asked to manage. He was admitted for rate control with IV cardizem. Troponins were negative x 2. He was felt to have a low CHADS2 score and with his noncompliance/EtOH abuse was not felt to be a Coumadin candidate. He was placed on the CIWA protocol for withdrawal prevention but showed no signs of such this admission. Psychiatry saw the patient this admission and felt he had depression secondary to substance abuse. Remeron was started for sleep and depression and Dr. Elsie Saas felt he was stable for [facility] once medically cleared. Since he will be attending a detox center, we will defer psychotropic medications prescriptions pending assessment at the center. Today, the patient remains rate controlled. Dr. Jens Som has seen and examined him today and feels he is stable for discharge once a bed becomes available with behavioral health.  Discharge  Vitals: Blood pressure 100/65, pulse 74, temperature 97.4 F (36.3 C), temperature source Oral, resp. rate 18, height 6\' 5"  (1.956 m), weight 201 lb 11.2 oz (91.491 kg), SpO2 98.00%.  Labs: Lab Results  Component Value Date   WBC 7.5 03/11/2011   HGB 15.6 03/11/2011   HCT 46.3 03/11/2011   MCV 93.0 03/11/2011   PLT 139* 03/11/2011    Lab 03/09/11 0600 03/06/11 2236  NA 138 --  K 4.0 --  CL 103 --  CO2 27 --  BUN 16 --  CREATININE 1.01 --  CALCIUM 8.9 --  PROT -- 5.7*  BILITOT -- 0.3  ALKPHOS -- 93  ALT -- 11  AST -- 14  GLUCOSE 87 --   No results found for this basename: CKTOTAL:4,CKMB:4,TROPONINI:4 in the last 72 hours Lab Results  Component Value Date   CHOL 129 01/04/2011   HDL 41 01/04/2011   LDLCALC 71 01/04/2011   TRIG 86 01/04/2011    Diagnostic Studies/Procedures   1. Dg Chest Portable 1 View 03/06/2011  *RADIOLOGY REPORT*  Clinical Data: Left lower chest pain for 5-6 days; shortness of breath and chronic cough.  Congestion and wheezing.  History of smoking.  PORTABLE CHEST - 1 VIEW  Comparison: Chest radiograph performed 02/11/2011  Findings: The lungs are well-aerated.  Chronic interstitial prominence is again noted.  There is no evidence of focal opacification, pleural effusion or pneumothorax.  The costophrenic angles are incompletely imaged on this study.  The heart is normal in size; the mediastinal contour is within normal limits.  No acute osseous abnormalities are  seen.  IMPRESSION: No acute cardiopulmonary process seen; mild chronic lung changes again noted.    Discharge Medications   Current Discharge Medication List    START taking these medications   Details  aspirin 81 MG EC tablet Take 1 tablet (81 mg total) by mouth daily. Enteric Coated Aspirin    Multiple Vitamin (MULITIVITAMIN WITH MINERALS) TABS Take 1 tablet by mouth daily.      CONTINUE these medications which have CHANGED   Details  diltiazem (CARDIZEM CD) 240 MG 24 hr capsule Take 1  capsule (240 mg total) by mouth daily. Qty: 30 capsule, Refills: 6      STOP taking these medications     aspirin 81 MG chewable tablet         Disposition   The patient will be discharged in stable condition to home. Discharge Orders    Future Orders Please Complete By Expires   Diet - low sodium heart healthy      Increase activity slowly        Follow-up Information    Follow up with [facility]. (Our office will call you for an appointment)    Contact information:   [address] [place] [address] [phone]            Duration of Discharge Encounter: Greater than 30 minutes including physician and PA time.  Signed, Ronie Spies PA-C 03/11/2011, 5:34 PM

## 2011-03-12 LAB — CBC
MCH: 31.9 pg (ref 26.0–34.0)
MCV: 93.9 fL (ref 78.0–100.0)
Platelets: 142 10*3/uL — ABNORMAL LOW (ref 150–400)
RDW: 14.6 % (ref 11.5–15.5)

## 2011-03-12 NOTE — Progress Notes (Signed)
Patient ID: [name], male   DOB: 1945/09/20, 66 y.o.   MRN: [id number] Patient ID: [name], male   DOB: 17-Sep-1945, 66 y.o.   MRN: [id number]    SUBJECTIVE: No complaints.  Still in atrial fibrillation but rate controlled.     Filed Vitals:   03/11/11 1803 03/11/11 2200 03/12/11 0555 03/12/11 0949  BP: 99/67 105/62 105/77 102/69  Pulse: 81 79 63   Temp: 97.7 F (36.5 C) 98.6 F (37 C) 98.8 F (37.1 C)   TempSrc: Oral Oral Oral   Resp: 18 19 19    Height:      Weight:      SpO2: 96% 97% 100%     Intake/Output Summary (Last 24 hours) at 03/12/11 1007 Last data filed at 03/12/11 0906  Gross per 24 hour  Intake    720 ml  Output      0 ml  Net    720 ml    LABS: Basic Metabolic Panel: No results found for this basename: NA:2,K:2,CL:2,CO2:2,GLUCOSE:2,BUN:2,CREATININE:2,CALCIUM:2,MG:2,PHOS:2 in the last 72 hours Liver Function Tests: No results found for this basename: AST:2,ALT:2,ALKPHOS:2,BILITOT:2,PROT:2,ALBUMIN:2 in the last 72 hours No results found for this basename: LIPASE:2,AMYLASE:2 in the last 72 hours CBC:  Basename 03/12/11 0650 03/11/11 0500  WBC 8.5 7.5  NEUTROABS -- --  HGB 16.2 15.6  HCT 47.7 46.3  MCV 93.9 93.0  PLT 142* 139*   Cardiac Enzymes: No results found for this basename: CKTOTAL:3,CKMB:3,CKMBINDEX:3,TROPONINI:3 in the last 72 hours  PHYSICAL EXAM General: NAD Neck: No JVD, no thyromegaly or thyroid nodule.  Lungs: Clear to auscultation bilaterally with normal respiratory effort. CV: Nondisplaced PMI.  Heart irregular S1/S2, no S3/S4, no murmur.  No peripheral edema.  No carotid bruit.  Normal pedal pulses.  Abdomen: Soft, nontender, no hepatosplenomegaly, no distention.  Neurologic: Alert and oriented x 3, not tremulous Psych: Normal affect. Extremities: No clubbing or cyanosis.   ASSESSMENT AND PLAN:  66 yo with history of paroxysmal atrial fibrillation and ETOH abuse has been trying to get into ETOH rehab but was found to  be in atrial fibrillation with RVR.  Recent echo with normal EF.  1. Atrial fibrillation: Now rate controlled on Cardizem CD. Has been deemed not to be coumadin candidate due to ETOH.  He is on ASA.   2. ETOH abuse: CIWA protocol.  No signs of significant withdrawal.  Ready for discharge to Behavioral Health if bed available.  Charlton Haws  03/12/2011 10:07 AM

## 2011-03-13 LAB — CBC
HCT: 47.3 % (ref 39.0–52.0)
MCHC: 34.7 g/dL (ref 30.0–36.0)
RDW: 14.6 % (ref 11.5–15.5)

## 2011-03-13 NOTE — Progress Notes (Signed)
   CARE MANAGEMENT NOTE 03/13/2011  Patient:  [name], [name]   Account Number:  [id number]  Date Initiated:  03/13/2011  Documentation initiated by:  GRAVES-BIGELOW,Eames Dibiasio  Subjective/Objective Assessment:   Pt admitted with paroxysmal atrial fibrillation and ETOH abuse has been trying to get into ETOH rehab but was found to be in atrial fibrillation with RVR     Action/Plan:   Plan is for d/c to Behavioral Health when bed  is available. CM did call SW to see when bed will be available.   Anticipated DC Date:  03/14/2011   Anticipated DC Plan:  [facility]AT[facility]IC [facility]  In-house referral  Clinical Social Worker      DC Planning Services  CM consult      Choice offered to / List presented to:             Status of service:  Completed, signed off Medicare Important Message given?   (If response is "NO", the following Medicare IM given date fields will be blank) Date Medicare IM given:   Date Additional Medicare IM given:    Discharge Disposition:  PSYCHIATRIC HOSPITAL  Per UR Regulation:    Comments:  03-13-11 [address], RN,BSN [phone]

## 2011-03-13 NOTE — Progress Notes (Signed)
Clinical Social Work Psychiatry  Assessment    Presenting Symptoms/Problems:  Depression and alcohol abuse.  Patient reports he is drinking an 18 pack of beer a day to help cope, pass the time, and deal with his depression.  Patient reports depression stems with his age and where he is in his life.    Psychiatric History: patient declines and past psych treatment in a hospital and reports he does not currently have an outsider provider at this time.  No current substance treatment at this time per report.  Patient reports he is depressed but denies and SI or AHV at this time.  No past attempts or psychosis reported.        Family Collateral Information: patient declines any family to speak with.  Patient reports he lives at an apartment in [place], is divorced and has children but are not part of their lives.  Reports he is retired from [employer] and just struggling with his life at this point and age.                            Emotional Health/Current Symptoms:    Suicide/Self harm: declined and no past attempts.    Attention/Behavioral/Psychotic Symptoms: Patient very flat affect and depressed.  Reports he is worried to go home because he does not want to start drinking again and have medical problems.  Patient able to correlate medical problems with substance abuse/ depression and unable to sleep.  Patient alert and oriented to self, place, and time and reports he is motivated for treatment at this time.        Recent Loss/Stressor: Reports just his current situation of being retired and Producer, television/film/video a new purpose in life (?r/o adjustment disorder)      Substance Abuse/Use: Patient reports he starts drinking around 11 am and continues drinking throughout the day totally around 18 beers + a day.  Reports he has had this habit for the past years.  Doers not report blackouts or DTs and denies any current legal problems.      Interpretive Summary/Anticipated DC Plan:   Throughout  chart, it is reported that patient will go to [facility], however patient has not been recommended for [facility] nor accepted and at this time does not meet inpatient criteria because he has completed detox while in the hospital.  That being said, patient has been referred to inpatient treatment at a [facility] abuse [facility] but is currently on the wait list.  Patient information was given and patient will be responsible for following up with his name on the list.  Patient has also been referred and accepted to [facility] in [facility], however no bed today, but patient can go tomorrow on 01/03 and patient is educated on how to make call and they will provide transport.  Patient also given resources for outpatient follow up and walk in clinics at [facility] on the [place].  Patient is motivated for SA treatment and also compliant with medication.  Resources also given for patient to follow up at [facility] at dc as well.  Patient to be dc home today and able and will follow up with outpatient providers. On the list at both [facility] (for long term treatment) and [facility] while waiting for [facility].  Patient was in need of transportation and patient will receive a bus pass.  No other needs at this time.  Updated CM and will need to update  doctor.  Ashley Jacobs, MSW LCSW [phone]

## 2011-03-13 NOTE — Progress Notes (Signed)
   CARE MANAGEMENT NOTE 03/13/2011  Patient:  KEIGAN, TAFOYA   Account Number:  [id number]  Date Initiated:  03/13/2011  Documentation initiated by:  GRAVES-BIGELOW,Sophea Rackham  Subjective/Objective Assessment:   Pt admitted with paroxysmal atrial fibrillation and ETOH abuse has been trying to get into ETOH rehab but was found to be in atrial fibrillation with RVR     Action/Plan:   Plan is for d/c to Behavioral Health when bed  is available. CM did call SW to see when be will be available.   Anticipated DC Date:  03/14/2011   Anticipated DC Plan:  PSYCHIATRIC HOSPITAL  In-house referral  Clinical Social Worker      DC Planning Services  CM consult      Choice offered to / List presented to:             Status of service:  In process, will continue to follow Medicare Important Message given?   (If response is "NO", the following Medicare IM given date fields will be blank) Date Medicare IM given:   Date Additional Medicare IM given:    Discharge Disposition:    Per UR Regulation:    Comments:  03-13-11 1204 Tomi Bamberger, RN,BSN [phone] CM received call back from SW [place] and she stated that the plan was for pt to go to rehab facility [facility] in [place]. He was not set for [facility] at all. SW is looking into case and will call CM with determination of disposition.Will continue to f/u.  03-13-11 [address] RN,BSN [phone]

## 2011-03-13 NOTE — Progress Notes (Signed)
   CARE MANAGEMENT NOTE 03/13/2011  Patient:  [name], [name]   Account Number:  [id number]  Date Initiated:  03/13/2011  Documentation initiated by:  GRAVES-BIGELOW,Saylor Sheckler  Subjective/Objective Assessment:   Pt admitted with paroxysmal atrial fibrillation and ETOH abuse has been trying to get into ETOH rehab but was found to be in atrial fibrillation with RVR     Action/Plan:   Plan is for d/c to Behavioral Health when bed  is available. CM did call SW to see when be will be available.   Anticipated DC Date:  03/14/2011   Anticipated DC Plan:  PSYCHIATRIC HOSPITAL  In-house referral  Clinical Social Worker      DC Planning Services  CM consult      Choice offered to / List presented to:             Status of service:  Completed, signed off Medicare Important Message given?   (If response is "NO", the following Medicare IM given date fields will be blank) Date Medicare IM given:   Date Additional Medicare IM given:    Discharge Disposition:  PSYCHIATRIC HOSPITAL  Per UR Regulation:    Comments:  03-13-11 [address] Tomi Bamberger, RN,BSN [phone] CM spoke to [place] and stated that pt will have bed available at [facility] in [facility]. Pt plan for bed availablity on 03-14-11. SW will provide pt wit bus pass. CM did make CC aware of d/c plans.   03-13-11 [address], [place] [phone] CM received call back from SW [place] and she stated that the plan was for pt to go to rehab facility [facility] in [place]. He was not set for [facility] at all. SW is looking into case and will call CM with determination of disposition.Will continue to f/u. 03-13-11 [address] RN,BSN [phone]

## 2011-03-13 NOTE — Progress Notes (Signed)
Patient ID: HESTON WIDENER, male   DOB: 02/01/1946, 66 y.o.   MRN: [id number] Patient ID: DARROLD BEZEK, male   DOB: 09-08-1945, 66 y.o.   MRN: [id number]    SUBJECTIVE: No complaints of chest pain or SOB.    Filed Vitals:   03/12/11 0949 03/12/11 1342 03/12/11 2228 03/13/11 0647  BP: 102/69 114/77 119/85 117/75  Pulse:  80 84 77  Temp:  97.7 F (36.5 C) 98.3 F (36.8 C) 97.7 F (36.5 C)  TempSrc:  Oral Oral Oral  Resp:  20 20 18   Height:      Weight:    215 lb 2.7 oz (97.6 kg)  SpO2:  97% 96% 96%    Intake/Output Summary (Last 24 hours) at 03/13/11 0718 Last data filed at 03/12/11 2221  Gross per 24 hour  Intake   1003 ml  Output      0 ml  Net   1003 ml    CBC:  Basename 03/13/11 0602 03/12/11 0650  WBC 9.2 8.5  NEUTROABS -- --  HGB 16.4 16.2  HCT 47.3 47.7  MCV 92.7 93.9  PLT 149* 142*   PHYSICAL EXAM General: NAD Neck: No JVD, no thyromegaly or thyroid nodule.  Lungs: Clear to auscultation bilaterally with normal respiratory effort. CV: Nondisplaced PMI.  Heart irregular S1/S2, no S3/S4, no murmur.  No peripheral edema.  No carotid bruit.  Normal pedal pulses.  Abdomen: Soft, nontender, no hepatosplenomegaly, no distention.  Neurologic: Alert and oriented x 3, not tremulous Psych: Normal affect. Extremities: No clubbing or cyanosis.   ASSESSMENT AND PLAN:  66 yo with history of paroxysmal atrial fibrillation and ETOH abuse has been trying to get into ETOH rehab but was found to be in atrial fibrillation with RVR.  Recent echo with normal EF.  1. Atrial fibrillation: Now rate controlled on Cardizem CD. Has been deemed not to be coumadin candidate due to ETOH.  He is on ASA.   2. ETOH abuse: CIWA protocol.  No signs of significant withdrawal.  Ready for discharge to Behavioral Health if bed available.  [name]  03/13/2011 7:18 AM

## 2011-03-14 ENCOUNTER — Encounter (HOSPITAL_COMMUNITY): Payer: Self-pay | Admitting: Emergency Medicine

## 2011-03-14 ENCOUNTER — Other Ambulatory Visit: Payer: Self-pay

## 2011-03-14 ENCOUNTER — Emergency Department (HOSPITAL_COMMUNITY)
Admission: EM | Admit: 2011-03-14 | Discharge: 2011-03-15 | Disposition: A | Discharge status: Home Health | Payer: Self-pay | Attending: Emergency Medicine | Admitting: Emergency Medicine

## 2011-03-14 DIAGNOSIS — R0789 Other chest pain: Secondary | ICD-10-CM

## 2011-03-14 DIAGNOSIS — F17200 Nicotine dependence, unspecified, uncomplicated: Secondary | ICD-10-CM | POA: Insufficient documentation

## 2011-03-14 DIAGNOSIS — I4891 Unspecified atrial fibrillation: Secondary | ICD-10-CM

## 2011-03-14 DIAGNOSIS — Z8619 Personal history of other infectious and parasitic diseases: Secondary | ICD-10-CM | POA: Insufficient documentation

## 2011-03-14 DIAGNOSIS — F32A Depression, unspecified: Secondary | ICD-10-CM

## 2011-03-14 DIAGNOSIS — Z7982 Long term (current) use of aspirin: Secondary | ICD-10-CM | POA: Insufficient documentation

## 2011-03-14 DIAGNOSIS — Z79899 Other long term (current) drug therapy: Secondary | ICD-10-CM | POA: Insufficient documentation

## 2011-03-14 DIAGNOSIS — F3289 Other specified depressive episodes: Secondary | ICD-10-CM | POA: Insufficient documentation

## 2011-03-14 DIAGNOSIS — F329 Major depressive disorder, single episode, unspecified: Secondary | ICD-10-CM | POA: Insufficient documentation

## 2011-03-14 DIAGNOSIS — R6889 Other general symptoms and signs: Secondary | ICD-10-CM | POA: Insufficient documentation

## 2011-03-14 DIAGNOSIS — F1010 Alcohol abuse, uncomplicated: Secondary | ICD-10-CM

## 2011-03-14 LAB — DIFFERENTIAL
Eosinophils Relative: 3 % (ref 0–5)
Lymphocytes Relative: 41 % (ref 12–46)
Lymphs Abs: 3.7 10*3/uL (ref 0.7–4.0)
Monocytes Absolute: 0.8 10*3/uL (ref 0.1–1.0)
Neutro Abs: 4.2 10*3/uL (ref 1.7–7.7)

## 2011-03-14 LAB — COMPREHENSIVE METABOLIC PANEL
ALT: 18 U/L (ref 0–53)
CO2: 22 mEq/L (ref 19–32)
Calcium: 9.3 mg/dL (ref 8.4–10.5)
Chloride: 95 mEq/L — ABNORMAL LOW (ref 96–112)
Creatinine, Ser: 0.87 mg/dL (ref 0.50–1.35)
GFR calc Af Amer: >90 mL/min (ref >90)
GFR calc non Af Amer: 89 mL/min — ABNORMAL LOW (ref >90)
Glucose, Bld: 74 mg/dL (ref 70–99)
Sodium: 130 mEq/L — ABNORMAL LOW (ref 135–145)
Total Bilirubin: 0.4 mg/dL (ref 0.3–1.2)

## 2011-03-14 LAB — CBC
HCT: 48 % (ref 39.0–52.0)
Hemoglobin: 17.1 g/dL — ABNORMAL HIGH (ref 13.0–17.0)
MCV: 91.1 fL (ref 78.0–100.0)
RBC: 5.27 MIL/uL (ref 4.22–5.81)
WBC: 9 10*3/uL (ref 4.0–10.5)

## 2011-03-14 LAB — RAPID URINE DRUG SCREEN, HOSP PERFORMED
Amphetamines: NOT DETECTED
Opiates: NOT DETECTED
Tetrahydrocannabinol: NOT DETECTED

## 2011-03-14 MED ORDER — IBUPROFEN 600 MG PO TABS: 600.0000 mg | ORAL_TABLET | Freq: Three times a day (TID) | ORAL | Status: DC | PRN

## 2011-03-14 MED ORDER — ZOLPIDEM TARTRATE 5 MG PO TABS: 5.0000 mg | ORAL_TABLET | Freq: Every evening | ORAL | Status: DC | PRN

## 2011-03-14 MED ORDER — DILTIAZEM HCL ER COATED BEADS 120 MG PO CP24
120.0000 mg | ORAL_CAPSULE | Freq: Once | ORAL | Status: DC
  Filled 2011-03-14: qty 1

## 2011-03-14 MED ORDER — ASPIRIN 81 MG PO CHEW
81.0000 mg | CHEWABLE_TABLET | Freq: Every day | ORAL | Status: DC
  Administered 2011-03-15: 81 mg via ORAL
  Filled 2011-03-14: qty 1

## 2011-03-14 MED ORDER — NICOTINE 21 MG/24HR TD PT24
21.0000 mg | MEDICATED_PATCH | Freq: Every day | TRANSDERMAL | Status: DC
  Administered 2011-03-15: 21 mg via TRANSDERMAL
  Filled 2011-03-14: qty 1

## 2011-03-14 MED ORDER — ONDANSETRON HCL 4 MG PO TABS: 4.0000 mg | ORAL_TABLET | Freq: Three times a day (TID) | ORAL | Status: DC | PRN

## 2011-03-14 MED ORDER — ACETAMINOPHEN 325 MG PO TABS: 650.0000 mg | ORAL_TABLET | ORAL | Status: DC | PRN

## 2011-03-14 MED ORDER — LORAZEPAM 1 MG PO TABS: 1.0000 mg | ORAL_TABLET | Freq: Three times a day (TID) | ORAL | Status: DC | PRN

## 2011-03-14 MED ORDER — LORAZEPAM 1 MG PO TABS
2.0000 mg | ORAL_TABLET | Freq: Once | ORAL | Status: AC
  Administered 2011-03-14: 2 mg via ORAL
  Filled 2011-03-14: qty 2

## 2011-03-14 MED ORDER — ALUM & MAG HYDROXIDE-SIMETH 200-200-20 MG/5ML PO SUSP: 30.0000 mL | ORAL | Status: DC | PRN

## 2011-03-14 NOTE — ED Provider Notes (Signed)
History     CSN: [id number]  Arrival date & time 03/14/11  4098   First MD Initiated Contact with Patient 03/14/11 2116      Chief Complaint  Patient presents with  . Chest Pain  . Medical Clearance    (Consider location/radiation/quality/duration/timing/severity/associated sxs/prior treatment) Patient is a 66 y.o. male presenting with chest pain. The history is provided by the patient.  Chest Pain   The patient has been having chest pain fairly constantly for the last 5 weeks. He describes it as a pressure in his central chest with some associated dyspnea. The pressures moderate and he rates it 5/10. They will reason that he is here, is that he wants to go through detox from alcohol. He was discharged from [facility] yesterday after being admitted for chest pain and atrial fibrillation. Upon discharge, he proceeded to drink beer. He states he drank at least a case of beer. He admits to depression with associated symptoms of early morning awakening and anhedonia. He denies crying spells, suicidal ideation, homicidal ideation, hallucinations. He has had some dyspnea without any nausea or vomiting or diaphoresis. He is been having problems with atrial fibrillation for a long time and was cardioverted 8 weeks ago. He thinks he has been in atrial fibrillation this time for at least 4 weeks. He denies other drug use other than smoking one pack of cigarettes a day.  Past Medical History  Diagnosis Date  . Campath-induced atrial fibrillation   . Angina   . Shortness of breath   . Hepatitis     hepatitis B    Past Surgical History  Procedure Date  . Mouth surgery     History reviewed. No pertinent family history.  History  Substance Use Topics  . Smoking status: Current Everyday Smoker -- 1.0 packs/day    Types: Cigarettes  . Smokeless tobacco: Not on file  . Alcohol Use: Yes     heavy drinker      Review of Systems  Cardiovascular: Positive for chest pain.  All  other systems reviewed and are negative.    Allergies  Review of patient's allergies indicates no known allergies.  Home Medications   Current Outpatient Rx  Name Route Sig Dispense Refill  . ASPIRIN EC 81 MG PO TBEC Oral Take 81-162 mg by mouth daily.      Marland Kitchen DILTIAZEM HCL ER COATED BEADS 120 MG PO CP24 Oral Take 120 mg by mouth daily.        BP 109/76  Pulse 69  Temp(Src) 97.4 F (36.3 C) (Oral)  Resp 18  SpO2 99%  Physical Exam  Vitals reviewed.  66 year old male who is resting comfortably and is in no acute distress. Vital signs are normal. Oxygen saturation is 99% which is normal. PERRLA, EOMI. The oropharynx is clear. Neck is soft supple without adenopathy or JVD. Head is normocephalic and atraumatic. Back is nontender. Lungs are clear without rales, wheezes, rhonchi. Heart has an irregular rhythm without murmur. Abdomen is soft, flat, nontender without masses or hepatosplenomegaly. Extremities have no cyanosis or edema, full range of motion is present. Skin is warm and moist without rash. Neurologic: Mental status is normal, cranial nerves are intact, there no focal motor or sensory deficits. Psychiatric: He does appear mildly to moderately depressed.  ED Course  Procedures (including critical care time)   Labs Reviewed  CBC  DIFFERENTIAL  COMPREHENSIVE METABOLIC PANEL  ETHANOL  TROPONIN I  URINE RAPID DRUG SCREEN (HOSP PERFORMED)  No results found. Results for orders placed during the hospital encounter of 03/14/11  CBC      Component Value Range   WBC 9.0  4.0 - 10.5 (K/uL)   RBC 5.27  4.22 - 5.81 (MIL/uL)   Hemoglobin 17.1 (*) 13.0 - 17.0 (g/dL)   HCT 11.9  14.7 - 82.9 (%)   MCV 91.1  78.0 - 100.0 (fL)   MCH 32.4  26.0 - 34.0 (pg)   MCHC 35.6  30.0 - 36.0 (g/dL)   RDW 56.2  13.0 - 86.5 (%)   Platelets 152  150 - 400 (K/uL)  DIFFERENTIAL      Component Value Range   Neutrophils Relative 47  43 - 77 (%)   Neutro Abs 4.2  1.7 - 7.7 (K/uL)   Lymphocytes  Relative 41  12 - 46 (%)   Lymphs Abs 3.7  0.7 - 4.0 (K/uL)   Monocytes Relative 9  3 - 12 (%)   Monocytes Absolute 0.8  0.1 - 1.0 (K/uL)   Eosinophils Relative 3  0 - 5 (%)   Eosinophils Absolute 0.2  0.0 - 0.7 (K/uL)   Basophils Relative 0  0 - 1 (%)   Basophils Absolute 0.0  0.0 - 0.1 (K/uL)  COMPREHENSIVE METABOLIC PANEL      Component Value Range   Sodium 130 (*) 135 - 145 (mEq/L)   Potassium 4.6  3.5 - 5.1 (mEq/L)   Chloride 95 (*) 96 - 112 (mEq/L)   CO2 22  19 - 32 (mEq/L)   Glucose, Bld 74  70 - 99 (mg/dL)   BUN 13  6 - 23 (mg/dL)   Creatinine, Ser 7.84  0.50 - 1.35 (mg/dL)   Calcium 9.3  8.4 - 69.6 (mg/dL)   Total Protein 7.5  6.0 - 8.3 (g/dL)   Albumin 4.0  3.5 - 5.2 (g/dL)   AST 22  0 - 37 (U/L)   ALT 18  0 - 53 (U/L)   Alkaline Phosphatase 99  39 - 117 (U/L)   Total Bilirubin 0.4  0.3 - 1.2 (mg/dL)   GFR calc non Af Amer 89 (*) >90 (mL/min)   GFR calc Af Amer >90  >90 (mL/min)  ETHANOL      Component Value Range   Alcohol, Ethyl (B) 97 (*) 0 - 11 (mg/dL)  TROPONIN I      Component Value Range   Troponin I <0.30  <0.30 (ng/mL)  URINE RAPID DRUG SCREEN (HOSP PERFORMED)      Component Value Range   Opiates NONE DETECTED  NONE DETECTED    Cocaine NONE DETECTED  NONE DETECTED    Benzodiazepines NONE DETECTED  NONE DETECTED    Amphetamines NONE DETECTED  NONE DETECTED    Tetrahydrocannabinol NONE DETECTED  NONE DETECTED    Barbiturates NONE DETECTED  NONE DETECTED    Dg Chest Portable 1 View  03/06/2011  *RADIOLOGY REPORT*  Clinical Data: Left lower chest pain for 5-6 days; shortness of breath and chronic cough.  Congestion and wheezing.  History of smoking.  PORTABLE CHEST - 1 VIEW  Comparison: Chest radiograph performed 02/11/2011  Findings: The lungs are well-aerated.  Chronic interstitial prominence is again noted.  There is no evidence of focal opacification, pleural effusion or pneumothorax.  The costophrenic angles are incompletely imaged on this study.  The  heart is normal in size; the mediastinal contour is within normal limits.  No acute osseous abnormalities are seen.  IMPRESSION: No acute cardiopulmonary process seen;  mild chronic lung changes again noted.  Original Report Authenticated By: Tonia Ghent, M.D.      No diagnosis found.   Date: 03/14/2011  Rate: 75  Rhythm: atrial fibrillation  QRS Axis: normal  Intervals: normal  ST/T Wave abnormalities: normal  Conduction Disutrbances:none  Narrative Interpretation: Atrial fibrillation with controlled ventricular response. When compared with ECG of 03/09/2011, no significant changes are noted.  Old EKG Reviewed: unchanged  His ECG and cardiac markers show no evidence of acute ischemia. His chest pain is chronic and does not represent cardiac ischemia. His hyponatremia is mild and does not require any specific intervention. He is considered medically cleared to pursue psychiatric treatment and is transferred to the psychiatric emergency department for evaluation by ACT Team.  MDM  Old charts were reviewed. He had been seen by cardiology while in [facility]. He initially presented with atrial fibrillation with rapid ventricular response, and has been treated with diltiazem with good control of rate. He had been cleared by cardiology prior to discharge.        Dione Booze, MD 03/14/11 [phone]

## 2011-03-14 NOTE — ED Notes (Signed)
Pt states he has constant pressure in his left chest area that he has had for months  Pt states he has some numbness in his left arm as if it is asleep  Pt states he also wishes to detox from alcohol  Pt states he normally drinks an 18 pk of beer a day  Pt states he has been drinking for a very long time

## 2011-03-14 NOTE — ED Notes (Signed)
Pt states he took 4 120mg  Cardizem tabs about an hour and a half ago to slow his heart rate down

## 2011-03-15 MED ORDER — DILTIAZEM HCL ER COATED BEADS 240 MG PO CP24
240.0000 mg | ORAL_CAPSULE | Freq: Every day | ORAL | Status: DC
  Administered 2011-03-15: 240 mg via ORAL
  Filled 2011-03-15: qty 1

## 2011-03-15 NOTE — ED Notes (Signed)
Shower offered, pt. Declined at this time.

## 2011-03-15 NOTE — ED Notes (Signed)
Pt. Signed all D/C papers, given community contact information by ACT, pt. Given 3 bags of clothes and 1 backpack.

## 2011-03-15 NOTE — ED Provider Notes (Signed)
  Physical Exam  BP 113/74  Pulse 81  Temp(Src) 97.9 F (36.6 C) (Oral)  Resp 18  SpO2 99%  Physical Exam  ED Course  Procedures  MDM Sleeping comfortably, no distress     Glynn Octave, MD 03/15/11 [phone]

## 2011-03-15 NOTE — ED Notes (Signed)
After sitting with patient during tele psych I asked pt if he would like a shower. Patient stated "I might get up in a little while". Told patient I would be back later to see if had changed his mind.

## 2011-03-15 NOTE — ED Notes (Signed)
Telepsychiatry consultation completed.  He is stable for discharge. Is not having suicidal or homicidal ideation. He is a long history of alcohol abuse and is not meet inpatient detox criteria. His appointment with day Loraine Leriche on January 9.  Glynn Octave, MD 03/15/11 1244

## 2011-03-15 NOTE — ED Notes (Signed)
Pt. Calm, sleeping, breathing wnl, pt. Denies S/I, HI, A/V/H.

## 2011-03-15 NOTE — Discharge Planning (Signed)
Patient was seen by telepsych who recommended discharge. EDP notified and agrees with disposition. Patient reports he is homeless and has spent his money .  He doesn't qualify for Medicaid due to his retirement income, which he has exhausted and will not recv another check until the 16th of month. CSW provided patient with shelter resources and made him an appointment with [facility] for January 9th at 8am.  Patient did not express SI/HI/AVH to this Clinical research associate, nor telepsych physician.  Ileene Hutchinson , MSW, LCSWA 03/15/2011  12:36 PM [phone]

## 2011-03-15 NOTE — BH Assessment (Signed)
Assessment Note   [name] is a 66 y.o. male who presents to the ED requesting detox from alcohol. Patient reports drinking an 18 pack of 12 ounce beers, every day, for the past two to three weeks. Patient states he attempted detox two to 3 weeks ago. Prior to that time patient states he has been drinking alcohol for over 20 years. Patient denies SI, HI, and AHVH.  Patient reports having difficulty sleeping stating he gets an average of 2 hours of sleep a night. Patient reports symptoms of depression including irritability and anhedonia. Patient states his last use was yesterday at 6pm. Patient reports experiencing anxiety but was unable to describe symptoms. Patient has no current therapist or psychiatrist. Patient no other substance use. Patient reports he is currently homeless.  Axis I: Alcohol Abuse Axis II: Deferred Axis III:  Past Medical History  Diagnosis Date  . Campath-induced atrial fibrillation   . Angina   . Shortness of breath   . Hepatitis     hepatitis B   Axis IV: other psychosocial or environmental problems Axis V: 41-50 serious symptoms  Past Medical History:  Past Medical History  Diagnosis Date  . Campath-induced atrial fibrillation   . Angina   . Shortness of breath   . Hepatitis     hepatitis B    Past Surgical History  Procedure Date  . Mouth surgery     Family History: History reviewed. No pertinent family history.  Social History:  reports that he has been smoking Cigarettes.  He has been smoking about 1 pack per day. He does not have any smokeless tobacco history on file. He reports that he drinks alcohol. He reports that he does not use illicit drugs.  Additional Social History:  Alcohol / Drug Use History of alcohol / drug use?: Yes Substance #1 Name of Substance 1: ETHO 1 - Age of First Use: 16 1 - Amount (size/oz): 18 pk of 12 oz beer  1 - Frequency: daily 1 - Duration: 2 to 3 weeks 1 - Last Use / Amount: yesterday/ 18 pk of 12 oz  beer Allergies: No Known Allergies  Home Medications:  Medications Prior to Admission  Medication Dose Route Frequency Provider Last Rate Last Dose  . acetaminophen (TYLENOL) tablet 650 mg  650 mg Oral Q4H PRN Dione Booze, MD      . alum & mag hydroxide-simeth (MAALOX/MYLANTA) 200-200-20 MG/5ML suspension 30 mL  30 mL Oral PRN Dione Booze, MD      . aspirin chewable tablet 81 mg  81 mg Oral Daily Dione Booze, MD      . diltiazem (CARDIZEM CD) 24 hr capsule 120 mg  120 mg Oral Once Dione Booze, MD      . ibuprofen (ADVIL,MOTRIN) tablet 600 mg  600 mg Oral Q8H PRN Dione Booze, MD      . LORazepam (ATIVAN) tablet 1 mg  1 mg Oral Q8H PRN Dione Booze, MD      . LORazepam (ATIVAN) tablet 2 mg  2 mg Oral Once Dione Booze, MD   2 mg at 03/14/11 2211  . nicotine (NICODERM CQ - dosed in mg/24 hours) patch 21 mg  21 mg Transdermal Daily Dione Booze, MD      . ondansetron [facility]) tablet 4 mg  4 mg Oral Q8H PRN Dione Booze, MD      . zolpidem [facility]) tablet 5 mg  5 mg Oral QHS PRN Dione Booze, MD      .  DISCONTD: 0.9 %  sodium chloride infusion  250 mL Intravenous PRN Odella Aquas      . DISCONTD: acetaminophen (TYLENOL) tablet 650 mg  650 mg Oral Q4H PRN Tatyana A Kirichenko, PA      . DISCONTD: aspirin chewable tablet 81 mg  81 mg Oral Daily Tatyana A Kirichenko, PA   81 mg at 03/12/11 1156  . DISCONTD: diltiazem (CARDIZEM CD) 24 hr capsule 240 mg  240 mg Oral Daily Beatrice Lecher, PA   240 mg at 03/13/11 1003  . DISCONTD: enoxaparin (LOVENOX) injection 40 mg  40 mg Subcutaneous Q24H Roger Arguello   40 mg at 03/12/11 2222  . DISCONTD: folic acid (FOLVITE) tablet 1 mg  1 mg Oral Daily Joline Salt Barrett, PA   1 mg at 03/13/11 1002  . DISCONTD: LORazepam (ATIVAN) tablet 1 mg  1 mg Oral Q8H PRN Tatyana A Kirichenko, PA   1 mg at 03/13/11 1450  . DISCONTD: mirtazapine (REMERON) tablet 15 mg  15 mg Oral QHS Nehemiah Settle, MD   15 mg at 03/12/11 2221  . DISCONTD: mulitivitamin with minerals  tablet 1 tablet  1 tablet Oral Daily Darrol Jump, PA   1 tablet at 03/13/11 1002  . DISCONTD: nicotine (NICODERM CQ - dosed in mg/24 hours) patch 21 mg  21 mg Transdermal Daily Tatyana A Kirichenko, PA   21 mg at 03/13/11 1003  . DISCONTD: nitroGLYCERIN (NITROSTAT) SL tablet 0.4 mg  0.4 mg Sublingual Q5 min PRN Odella Aquas      . DISCONTD: ondansetron (ZOFRAN) injection 4 mg  4 mg Intravenous Q6H PRN Odella Aquas      . DISCONTD: ondansetron (ZOFRAN) tablet 4 mg  4 mg Oral Q8H PRN Tatyana A Kirichenko, PA      . DISCONTD: sodium chloride 0.9 % injection 3 mL  3 mL Intravenous Q12H Roger Arguello   3 mL at 03/12/11 2226  . DISCONTD: sodium chloride 0.9 % injection 3 mL  3 mL Intravenous PRN Odella Aquas      . DISCONTD: thiamine (VITAMIN B-1) tablet 100 mg  100 mg Oral Daily Joline Salt Barrett, PA   100 mg at 03/13/11 1002  . DISCONTD: zolpidem (AMBIEN) tablet 5 mg  5 mg Oral QHS PRN Tatyana A Kirichenko, PA   5 mg at 03/12/11 2222   No current outpatient prescriptions on file as of 03/14/2011.    OB/GYN Status:  No LMP for male patient.  General Assessment Data Location of Assessment: [facility] Living Arrangements: Alone;Homeless Can pt return to current living arrangement?: Yes Admission Status: Voluntary Is patient capable of signing voluntary admission?: Yes Transfer from: Home Referral Source: Self/Family/Friend  Education Status Is patient currently in school?: No  Risk to self Suicidal Ideation: No Suicidal Intent: No Is patient at risk for suicide?: No Suicidal Plan?: No Access to Means: No What has been your use of drugs/alcohol within the last 12 months?: ETHO Previous Attempts/Gestures: No How many times?: 0  Other Self Harm Risks: none Triggers for Past Attempts: None known Intentional Self Injurious Behavior: None Family Suicide History: No Recent stressful life event(s):  (none noted) Persecutory voices/beliefs?: No Depression: Yes Depression Symptoms:  Loss of interest in usual pleasures Substance abuse history and/or treatment for substance abuse?: Yes Suicide prevention information given to non-admitted patients: Not applicable  Risk to Others Homicidal Ideation: No Thoughts of Harm to Others: No Current Homicidal Intent: No Current Homicidal Plan: No Access to Homicidal Means: No  Identified Victim: none History of harm to others?: No Assessment of Violence: None Noted Violent Behavior Description: none Does patient have access to weapons?: No Criminal Charges Pending?: No Does patient have a court date: No  Psychosis Hallucinations: None noted Delusions: None noted  Mental Status Report Appear/Hygiene: Poor hygiene Eye Contact: Fair Motor Activity: Unremarkable Speech: Logical/coherent Level of Consciousness: Drowsy Mood: Irritable Affect: Irritable Anxiety Level: None Thought Processes: Coherent;Relevant Judgement: Impaired Orientation: Person;Place;Time;Situation Obsessive Compulsive Thoughts/Behaviors: None  Cognitive Functioning Concentration: Normal Memory: Recent Intact;Remote Intact IQ: Average Insight: Poor Impulse Control: Poor Appetite: Fair Weight Loss: 0  Weight Gain: 0  Sleep: Decreased Total Hours of Sleep: 2  Vegetative Symptoms: None  Prior Inpatient Therapy Prior Inpatient Therapy: Yes Prior Therapy Dates:  (2010) Prior Therapy Facilty/Provider(s): [facility] Reason for Treatment: detox  Prior Outpatient Therapy Prior Outpatient Therapy: No Prior Therapy Dates: na Prior Therapy Facilty/Provider(s): na Reason for Treatment: na  ADL Screening (condition at time of admission) Patient's cognitive ability adequate to safely complete daily activities?: Yes Patient able to express need for assistance with ADLs?: Yes Independently performs ADLs?: Yes Weakness of Legs: None Weakness of Arms/Hands: None  Home Assistive Devices/Equipment Home Assistive Devices/Equipment: None      Abuse/Neglect Assessment (Assessment to be complete while patient is alone) Physical Abuse: Denies Verbal Abuse: Denies Sexual Abuse: Denies Exploitation of patient/patient's resources: Denies Self-Neglect: Denies Values / Beliefs Cultural Requests During Hospitalization: None Spiritual Requests During Hospitalization: None        Additional Information 1:1 In Past 12 Months?: No CIRT Risk: No Elopement Risk: No Does patient have medical clearance?: Yes  Child/Adolescent Assessment Running Away Risk: Denies Bed-Wetting: Denies  Disposition:  Disposition Disposition of Patient: Referred to Type of inpatient treatment program: Adult Patient referred to: [facility]  On Site Evaluation by:   Reviewed with Physician:     Georgina Quint A 03/15/2011 5:47 AM

## 2011-03-17 ENCOUNTER — Encounter (HOSPITAL_COMMUNITY): Payer: Self-pay | Admitting: *Deleted

## 2011-03-17 ENCOUNTER — Other Ambulatory Visit: Payer: Self-pay

## 2011-03-17 ENCOUNTER — Inpatient Hospital Stay (HOSPITAL_COMMUNITY)
Admission: EM | Admit: 2011-03-17 | Discharge: 2011-03-20 | DRG: 309 | Disposition: A | Discharge status: Home / Self Care | Payer: MEDICAID | Attending: Family Medicine | Admitting: Family Medicine

## 2011-03-17 DIAGNOSIS — F1010 Alcohol abuse, uncomplicated: Secondary | ICD-10-CM | POA: Diagnosis present

## 2011-03-17 DIAGNOSIS — F329 Major depressive disorder, single episode, unspecified: Secondary | ICD-10-CM | POA: Diagnosis present

## 2011-03-17 DIAGNOSIS — R079 Chest pain, unspecified: Secondary | ICD-10-CM | POA: Diagnosis present

## 2011-03-17 DIAGNOSIS — I4891 Unspecified atrial fibrillation: Principal | ICD-10-CM | POA: Diagnosis present

## 2011-03-17 DIAGNOSIS — B1910 Unspecified viral hepatitis B without hepatic coma: Secondary | ICD-10-CM | POA: Diagnosis present

## 2011-03-17 DIAGNOSIS — F3289 Other specified depressive episodes: Secondary | ICD-10-CM | POA: Diagnosis present

## 2011-03-17 DIAGNOSIS — F17200 Nicotine dependence, unspecified, uncomplicated: Secondary | ICD-10-CM | POA: Diagnosis present

## 2011-03-17 DIAGNOSIS — Z7982 Long term (current) use of aspirin: Secondary | ICD-10-CM

## 2011-03-17 LAB — COMPREHENSIVE METABOLIC PANEL
ALT: 20 U/L (ref 0–53)
ALT: 16 U/L (ref 0–53)
AST: 26 U/L (ref 0–37)
Albumin: 3.5 g/dL (ref 3.5–5.2)
Alkaline Phosphatase: 103 U/L (ref 39–117)
CO2: 23 mEq/L (ref 19–32)
Calcium: 9.9 mg/dL (ref 8.4–10.5)
Calcium: 8.9 mg/dL (ref 8.4–10.5)
Chloride: 99 mEq/L (ref 96–112)
GFR calc Af Amer: >90 mL/min (ref >90)
GFR calc Af Amer: >90 mL/min (ref >90)
GFR calc non Af Amer: 90 mL/min — ABNORMAL LOW (ref >90)
Glucose, Bld: 74 mg/dL (ref 70–99)
Glucose, Bld: 145 mg/dL — ABNORMAL HIGH (ref 70–99)
Potassium: 4.7 mEq/L (ref 3.5–5.1)
Potassium: 3.7 mEq/L (ref 3.5–5.1)
Sodium: 136 mEq/L (ref 135–145)
Sodium: 137 mEq/L (ref 135–145)
Total Bilirubin: 0.7 mg/dL (ref 0.3–1.2)
Total Protein: 6.5 g/dL (ref 6.0–8.3)

## 2011-03-17 LAB — CBC
HCT: 45.1 % (ref 39.0–52.0)
Hemoglobin: 17.8 g/dL — ABNORMAL HIGH (ref 13.0–17.0)
MCH: 32.7 pg (ref 26.0–34.0)
Platelets: 161 10*3/uL (ref 150–400)
Platelets: 161 10*3/uL (ref 150–400)
RBC: 5.45 MIL/uL (ref 4.22–5.81)
RBC: 5 MIL/uL (ref 4.22–5.81)
RDW: 14.1 % (ref 11.5–15.5)
WBC: 11.6 10*3/uL — ABNORMAL HIGH (ref 4.0–10.5)
WBC: 7.1 10*3/uL (ref 4.0–10.5)

## 2011-03-17 LAB — TSH: TSH: 1.563 u[IU]/mL (ref 0.350–4.500)

## 2011-03-17 LAB — DIFFERENTIAL
Basophils Absolute: 0 10*3/uL (ref 0.0–0.1)
Lymphocytes Relative: 35 % (ref 12–46)
Lymphs Abs: 2.5 10*3/uL (ref 0.7–4.0)
Neutro Abs: 3.8 10*3/uL (ref 1.7–7.7)

## 2011-03-17 LAB — APTT: aPTT: 28 seconds (ref 24–37)

## 2011-03-17 LAB — POCT I-STAT TROPONIN I: Troponin i, poc: 0.01 ng/mL (ref 0.00–0.08)

## 2011-03-17 LAB — CARDIAC PANEL(CRET KIN+CKTOT+MB+TROPI): Total CK: 54 U/L (ref 7–232)

## 2011-03-17 LAB — MAGNESIUM: Magnesium: 1.9 mg/dL (ref 1.5–2.5)

## 2011-03-17 LAB — HEMOGLOBIN A1C: Mean Plasma Glucose: 111 mg/dL (ref <117)

## 2011-03-17 MED ORDER — DILTIAZEM HCL 100 MG IV SOLR
5.0000 mg/h | INTRAVENOUS | Status: DC
  Administered 2011-03-17 (×2): 5 mg/h via INTRAVENOUS
  Filled 2011-03-17 (×2): qty 100

## 2011-03-17 MED ORDER — FOLIC ACID 1 MG PO TABS
1.0000 mg | ORAL_TABLET | Freq: Every day | ORAL | Status: DC
  Administered 2011-03-17: 1 mg via ORAL

## 2011-03-17 MED ORDER — ZOLPIDEM TARTRATE 5 MG PO TABS
5.0000 mg | ORAL_TABLET | Freq: Once | ORAL | Status: AC
  Administered 2011-03-17: 5 mg via ORAL
  Filled 2011-03-17: qty 1

## 2011-03-17 MED ORDER — SODIUM CHLORIDE 0.9 % IV SOLN
INTRAVENOUS | Status: DC
  Administered 2011-03-17: 18:00:00 via INTRAVENOUS

## 2011-03-17 MED ORDER — VITAMIN B-1 100 MG PO TABS
100.0000 mg | ORAL_TABLET | ORAL | Status: AC
  Administered 2011-03-17: 100 mg via ORAL
  Filled 2011-03-17: qty 1

## 2011-03-17 MED ORDER — ADULT MULTIVITAMIN W/MINERALS CH
1.0000 | ORAL_TABLET | ORAL | Status: AC
  Administered 2011-03-17: 1 via ORAL
  Filled 2011-03-17: qty 1

## 2011-03-17 MED ORDER — FOLIC ACID 1 MG PO TABS
1.0000 mg | ORAL_TABLET | ORAL | Status: DC
  Filled 2011-03-17: qty 1

## 2011-03-17 MED ORDER — ASPIRIN EC 81 MG PO TBEC
81.0000 mg | DELAYED_RELEASE_TABLET | Freq: Every day | ORAL | Status: DC
  Administered 2011-03-17 – 2011-03-19 (×3): 81 mg via ORAL
  Filled 2011-03-17 (×4): qty 1

## 2011-03-17 MED ORDER — ADULT MULTIVITAMIN W/MINERALS CH
1.0000 | ORAL_TABLET | Freq: Every day | ORAL | Status: DC
  Administered 2011-03-18 – 2011-03-19 (×2): 1 via ORAL
  Filled 2011-03-17 (×3): qty 1

## 2011-03-17 MED ORDER — LORAZEPAM 0.5 MG PO TABS
1.0000 mg | ORAL_TABLET | Freq: Four times a day (QID) | ORAL | Status: DC | PRN
  Administered 2011-03-17 – 2011-03-20 (×8): 1 mg via ORAL
  Filled 2011-03-17: qty 1
  Filled 2011-03-17 (×3): qty 2
  Filled 2011-03-17 (×2): qty 1
  Filled 2011-03-17 (×3): qty 2

## 2011-03-17 MED ORDER — NICOTINE 7 MG/24HR TD PT24
7.0000 mg | MEDICATED_PATCH | Freq: Once | TRANSDERMAL | Status: AC
  Administered 2011-03-17: 7 mg via TRANSDERMAL
  Filled 2011-03-17: qty 1

## 2011-03-17 MED ORDER — THIAMINE HCL 100 MG/ML IJ SOLN
100.0000 mg | Freq: Every day | INTRAMUSCULAR | Status: DC
  Filled 2011-03-17 (×3): qty 1

## 2011-03-17 MED ORDER — VITAMIN B-1 100 MG PO TABS
100.0000 mg | ORAL_TABLET | Freq: Every day | ORAL | Status: DC
  Administered 2011-03-18 – 2011-03-19 (×2): 100 mg via ORAL
  Filled 2011-03-17 (×3): qty 1

## 2011-03-17 MED ORDER — LORAZEPAM 2 MG/ML IJ SOLN: 1.0000 mg | Freq: Four times a day (QID) | INTRAMUSCULAR | Status: DC | PRN

## 2011-03-17 MED ORDER — FOLIC ACID 1 MG PO TABS
1.0000 mg | ORAL_TABLET | Freq: Every day | ORAL | Status: DC
  Administered 2011-03-18 – 2011-03-19 (×2): 1 mg via ORAL
  Filled 2011-03-17 (×3): qty 1

## 2011-03-17 NOTE — ED Notes (Signed)
Pt reports that he drinks alcohol every day.  States he normally drinks at least an 18 pack of beer every day.  Last drink was around midnight last night.   Pt states he has been off of his medications for the last three days.

## 2011-03-17 NOTE — Consult Note (Signed)
CARDIOLOGY CONSULT NOTE  Patient ID: [name] MRN: [id number], SOB: 1945-06-13 66 y.o. Date of Encounter: 03/17/2011, 1:07 PM  Primary Physician: Donia Guiles, MD  Primary Cardiologist: [name], MD  Reason for Consultation: Patient is a 66 year old male, just discharged from here on December 31, following presentation with PAF RVR, He was discharged on Cardizem CD 240 mg daily and ASA 81 mg daily. He has no known history of CAD, and ruled out for MI. He has history of a negative Adenosine Myoview in 2010, with normal LVF. Although he has a CHADS score of 2, given his history of noncompliance and EtOH abuse, he was deemed not a suitable candidate for Coumadin.  He returned today with complaint of CP, SOB, palpitations. He also came seeking help for his EtOH abuse, and admitted to drinking at least 1 six pack of beer, shortly after midnight last night. When he awoke this morning, he felt the aforementioned symptoms.  On arrival, patient found to be in A. fib at approximately 160 bpm; he was placed on IV diltiazem 5 cc an hour, greater currently in the 80-90 bpm range. His EtOH level is 97, and initial troponin is negative.   Past Medical History  Diagnosis Date  . Campath-induced atrial fibrillation   . Angina   . Shortness of breath   . Hepatitis     hepatitis B  . Palpitations   . Hypertension      Surgical History:  Past Surgical History  Procedure Date  . Mouth surgery      Home Meds: Prior to Admission medications   Medication Sig Start Date End Date Taking? Authorizing Provider  aspirin EC 81 MG tablet Take 81 mg by mouth daily.    Yes Historical Provider, MD  diltiazem (CARDIZEM CD) 120 MG 24 hr capsule Take 240 mg by mouth daily.    Yes Historical Provider, MD    Allergies: No Known Allergies  History   Social History  . Marital Status: Divorced    Spouse Name: N/A    Number of Children: N/A  . Years of Education: N/A   Occupational History  . Not  on file.   Social History Main Topics  . Smoking status: Current Everyday Smoker -- 1.0 packs/day    Types: Cigarettes  . Smokeless tobacco: Not on file  . Alcohol Use: 10.8 oz/week    18 Cans of beer per week     heavy drinker  . Drug Use: No  . Sexually Active: No   Other Topics Concern  . Not on file   Social History Narrative  . No narrative on file     History reviewed. No pertinent family history.  Review of Systems:  General: negative for chills, fever, night sweats or weight changes.  Cardiovascular: negative for chest pain, dyspnea on exertion, edema, orthopnea, palpitations, paroxysmal nocturnal dyspnea or shortness of breath Dermatological: negative for rash Respiratory: negative for cough or wheezing Urologic: negative for hematuria Abdominal: negative for nausea, vomiting, diarrhea, bright red blood per rectum, melena, or hematemesis Neurologic: negative for visual changes, syncope, or dizziness All other systems reviewed and are otherwise negative except as noted above.  Labs:   Lab Results  Component Value Date   WBC 11.6* 03/17/2011   HGB 17.8* 03/17/2011   HCT 49.4 03/17/2011   MCV 90.6 03/17/2011   PLT 161 03/17/2011    Lab 03/17/11 0950  NA 136  K 4.7  CL 99  CO2 23  BUN  12  CREATININE 0.86  CALCIUM 9.9  PROT 7.6  BILITOT 0.7  ALKPHOS 103  ALT 20  AST 26  GLUCOSE 74    Basename 03/14/11 2205  CKTOTAL --  CKMB --  TROPONINI <0.30   Lab Results  Component Value Date   CHOL 129 01/04/2011   HDL 41 01/04/2011   LDLCALC 71 01/04/2011   TRIG 86 01/04/2011   No results found for this basename: DDIMER    Radiology/Studies:  Dg Chest Portable 1 View  03/06/2011  *RADIOLOGY REPORT*  Clinical Data: Left lower chest pain for 5-6 days; shortness of breath and chronic cough.  Congestion and wheezing.  History of smoking.  PORTABLE CHEST - 1 VIEW  Comparison: Chest radiograph performed 02/11/2011  Findings: The lungs are well-aerated.  Chronic  interstitial prominence is again noted.  There is no evidence of focal opacification, pleural effusion or pneumothorax.  The costophrenic angles are incompletely imaged on this study.  The heart is normal in size; the mediastinal contour is within normal limits.  No acute osseous abnormalities are seen.  IMPRESSION: No acute cardiopulmonary process seen; mild chronic lung changes again noted.  Original Report Authenticated By: Tonia Ghent, M.D.     EKG: Atrial fibrillation at approximately 160 bpm; borderline LAD; no acute changes  Physical Exam:  Blood pressure 122/84, pulse 45, temperature 97.6 F (36.4 C), temperature source Oral, resp. rate 18, SpO2 98.00%. General: 66 year-old male, slightly disheveled appearing, lying supine; in no acute distress. Head: Normocephalic, atraumatic, sclera non-icteric, no xanthomas, nares are without discharge.  Neck: Negative for carotid bruits. JVD not elevated. Lungs: Diminished breath sounds in bases, with faint expiratory wheezes; no crackles. Heart: Irregularly irregular with S1 S2. No murmurs, rubs, or gallops appreciated. Abdomen: Soft, non-tender, non-distended with normoactive bowel sounds. No hepatomegaly. No rebound/guarding. No obvious abdominal masses. Msk:  Strength and tone appear normal for age. Extremities: No clubbing or cyanosis. No edema.  Distal pedal pulses are 2+ and equal bilaterally. Neuro: Alert and oriented X 3. Moves all extremities spontaneously. Psych:  Responds to questions appropriately with a normal affect.    ASSESSMENT AND PLAN:  Atrial fibrillation with RVR  - Not suitable Coumadin candidate Chest pain, atypical  - Negative MI, recent hospitalization  - Negative adenosine Myoview; EF 64%, normal wall motion, 2010 EtOH abuse Tobacco, ongoing Depression  PLAN: Recommended resuming Cardizem CD 240 mg PO daily, in conjunction with ASA 81 mg daily. Would d/c IV diltiazem 2 hours after pt receives his oral Cardizem  dose. No further cardiac workup recommended. Patient will need to reestablish with [place] here in our [place] office, folowing discharge, as recently recommended.  Signed, Gene Serpe PA-C 03/17/2011, 1:07 PM   Attending note:  Patient seen and examined. Reviewed recent records as well as database per Mr. Shara Blazing. Mr. Sattler is essentially homeless, living in a hotel until the "money ran out" recently. He has a history of alcohol abuse, and my understanding is that he was working with behavioral health here at [facility] to arrange for admission for rehabilitation at a facility in [place]. This reportedly fell through, and he had a relapse of alcohol use after recent discharge, is now back with atrial fibrillation with RVR. He states that he did not get prescriptions for Cardizem CD at discharge, and has not been on any medications since then.   He was placed on IV Cardizem 5 mg per hour in the ER with improvement in heart rate, hemodynamic stability. Lab  work reviewed including alcohol level less than 11, POC troponin 0.01, ECG showing coarse atrial fibrillation versus atypical flutter with RVR.  On examination systolic blood pressure is 160, heart rate in the 80s in atrial fibrillation. He is in no acute distress. No active chest pain. Lungs clear with diminished breath sounds. Cardiac exam is irregularly irregular rhythm, no rub. No peripheral edema.  We have contacted the internal medicine teaching service to admit the patient, and assist with workup focusing mainly on social issues, behavioral health, and possibly rearranging an admission for alcohol rehabilitation. From a cardiac perspective, the plan will be unchanged. Initiate aspirin 81 mg daily, and start Cardizem CD 240 mg daily with discontinuation of IV infusion. Can cycle cardiac markers and followup ECG in the morning. Our service will continue to follow with you.  Jonelle Sidle, M.D., F.A.C.C.

## 2011-03-17 NOTE — ED Provider Notes (Signed)
History     CSN: [id number]  Arrival date & time 03/17/11  0846   First MD Initiated Contact with Patient 03/17/11 0900      Chief Complaint  Patient presents with  . Palpitations    (Consider location/radiation/quality/duration/timing/severity/associated sxs/prior treatment) HPI  Pt has a history of A. Fib, heavy drinking and smoking, presents to the ED with complaint of palpitations. THe patient states that the feeling has been going on for a few days, he denies chest pain but admits to a chest pressure and feeling weak. He has not taken his prescribed Diltiazem in "a couple of days, maybe 3 or 4" and he admits to drinking "a whole lot lately".  He has not been buying his medications as he states he is homeless. Pt is able to talk in complete sentences and is awake and alert at the time. Pt was recently discharged from hospital for the same and has recently been seen at [facility] for psychiatric problems.  Past Medical History  Diagnosis Date  . Campath-induced atrial fibrillation   . Angina   . Shortness of breath   . Hepatitis     hepatitis B  . Palpitations   . Hypertension     Past Surgical History  Procedure Date  . Mouth surgery     History reviewed. No pertinent family history.  History  Substance Use Topics  . Smoking status: Current Everyday Smoker -- 1.0 packs/day    Types: Cigarettes  . Smokeless tobacco: Not on file  . Alcohol Use: 10.8 oz/week    18 Cans of beer per week     heavy drinker      Review of Systems  All other systems reviewed and are negative.    Allergies  Review of patient's allergies indicates no known allergies.  Home Medications   Current Outpatient Rx  Name Route Sig Dispense Refill  . ASPIRIN EC 81 MG PO TBEC Oral Take 81 mg by mouth daily.     Marland Kitchen DILTIAZEM HCL ER COATED BEADS 120 MG PO CP24 Oral Take 120 mg by mouth daily.       BP 110/74  Pulse 84  Temp(Src) 97.6 F (36.4 C) (Oral)  Resp 15  SpO2 97%  Physical  Exam  Nursing note and vitals reviewed. Constitutional: He appears well-developed and well-nourished.  HENT:  Head: Normocephalic and atraumatic.  Eyes: EOM are normal. Pupils are equal, round, and reactive to light.  Neck: Normal range of motion.  Cardiovascular: An irregular rhythm present. Tachycardia present.   Pulmonary/Chest: Effort normal. No respiratory distress. He has wheezes. He has no rales.  Musculoskeletal: Normal range of motion.  Neurological: He is alert.  Skin: Skin is warm and dry.    ED Course  Procedures (including critical care time)  Labs Reviewed  CBC - Abnormal; Notable for the following:    WBC 11.6 (*)    Hemoglobin 17.8 (*)    All other components within normal limits  COMPREHENSIVE METABOLIC PANEL - Abnormal; Notable for the following:    GFR calc non Af Amer 90 (*)    All other components within normal limits  POCT I-STAT TROPONIN I  I-STAT TROPONIN I  ETHANOL   No results found.   1. Atrial fibrillation with RVR       MDM   Date: 03/17/2011  Rate: 159  Rhythm: atrial fibrillation  QRS Axis: normal  Intervals: normal  ST/T Wave abnormalities: normal  Conduction Disutrbances:left posterior fascicular block  Narrative Interpretation:  Old EKG Reviewed: last EKG shows Afib with controlled rate at 75 bmp from  Mar 14, 2011   Pt stayed stable throughout his stay in the ED has been placed on alcohol withdrawal precautions. [place] cardiology to admit patient.        Dorthula Matas, PA 03/17/11 1143

## 2011-03-17 NOTE — ED Provider Notes (Signed)
Medical screening examination/treatment/procedure(s) were conducted as a shared visit with non-physician practitioner(s) and myself.  I personally evaluated the patient during the encounter.  Pt with a fib with RVR.  Improved HR with IV diltiazem.  Likely due to prior h/o atrial fib and also alcohol abuse.  Pt with some depression and passive SI, no specific plan other than continue to drink alcohol.  Pt was in the [facility] ED for several days for depression and alcohol problems, but discharged 3 days ago.  He has chest tightness, but chronic and consistent for years.  PAC Neva Seat has spoken to both [facility] and [facility] cardiologist who will both see and one will admit.     [name]. Leilyn Frayre, MD 03/17/11 1319

## 2011-03-17 NOTE — ED Notes (Signed)
MD at bedside.  Lebaurer PA for cardiology consult

## 2011-03-17 NOTE — H&P (Signed)
PCP:  Sheila Oats, MD   DOA:  03/17/2011  8:48 AM  Chief Complaint:  Atrial fibrillation  HPI: 66 year old male with history of paroxysmal atrial fibrillation with RVR not on anticoagulation due to alcohol abuse presents with complaints of shortness of breath, chest pain and palpitations. As per patient chest pain is retrosternal, 7/10 in intensity, non radiating and with no alleviating factors. Patient denizes dizziness, lightheadedness, loss of consciousness. No abdominal pain, no nausea or vomiting, no blood in stool or urine.  In ED patient was found to be in A. fib at 160 bpm; he was placed on IV diltiazem 88ml/hr   Allergies: No Known Allergies  Prior to Admission medications   Medication Sig Start Date End Date Taking? Authorizing Provider  aspirin EC 81 MG tablet Take 81 mg by mouth daily.    Yes Historical Provider, MD  diltiazem (CARDIZEM CD) 120 MG 24 hr capsule Take 120 mg by mouth daily.    Yes Historical Provider, MD    Past Medical History  Diagnosis Date  . Campath-induced atrial fibrillation   . Angina   . Shortness of breath   . Hepatitis     hepatitis B  . Palpitations   . Hypertension     Past Surgical History  Procedure Date  . Mouth surgery     Social History:  reports that he has been smoking Cigarettes.  He has been smoking about 1 pack per day. He does not have any smokeless tobacco history on file. He reports that he drinks about 10.8 ounces of alcohol per week. He reports that he does not use illicit drugs.  History reviewed. No pertinent family history.  Review of Systems:  Constitutional: Denies fever, chills, diaphoresis, appetite change and fatigue.  HEENT: Denies photophobia, eye pain, redness, hearing loss, ear pain, congestion, sore throat, rhinorrhea, sneezing, mouth sores, trouble swallowing, neck pain, neck stiffness and tinnitus.   Respiratory: Denies SOB, DOE, cough, chest tightness,  and wheezing.   Cardiovascular: Denies  chest pain, palpitations and leg swelling.  Gastrointestinal: Denies nausea, vomiting, abdominal pain, diarrhea, constipation, blood in stool and abdominal distention.  Genitourinary: Denies dysuria, urgency, frequency, hematuria, flank pain and difficulty urinating.  Musculoskeletal: Denies myalgias, back pain, joint swelling, arthralgias and gait problem.  Skin: Denies pallor, rash and wound.  Neurological: Denies dizziness, seizures, syncope, weakness, light-headedness, numbness and headaches.  Hematological: Denies adenopathy. Easy bruising, personal or family bleeding history  Psychiatric/Behavioral: Denies suicidal ideation, mood changes, confusion, nervousness, sleep disturbance and agitation   Physical Exam:  Filed Vitals:   03/17/11 1200 03/17/11 1300 03/17/11 1400 03/17/11 1428  BP: 122/84 160/84 108/64   Pulse: 45 72 103   Temp:    98.4 F (36.9 C)  TempSrc:    Oral  Resp: 18 15 18    SpO2: 98% 98% 87%     Constitutional: Vital signs reviewed.  Patient is a well-developed and well-nourished in no acute distress and cooperative with exam. Alert and oriented x3.  Head: Normocephalic and atraumatic Ear: TM normal bilaterally Mouth: no erythema or exudates, MMM Eyes: PERRL, EOMI, conjunctivae normal, No scleral icterus.  Neck: Supple, Trachea midline normal ROM, No JVD, mass, thyromegaly, or carotid bruit present.  Cardiovascular: RRR, S1 normal, S2 normal, no MRG, pulses symmetric and intact bilaterally Pulmonary/Chest: CTAB, no wheezes, rales, or rhonchi Abdominal: Soft. Non-tender, non-distended, bowel sounds are normal, no masses, organomegaly, or guarding present.  GU: no CVA tenderness Musculoskeletal: No joint deformities, erythema, or stiffness, ROM  full and no nontender Ext: no edema and no cyanosis, pulses palpable bilaterally (DP and PT) Hematology: no cervical, inginal, or axillary adenopathy.  Neurological: A&O x3, Strenght is normal and symmetric bilaterally,  cranial nerve II-XII are grossly intact, no focal motor deficit, sensory intact to light touch bilaterally.  Skin: Warm, dry and intact. No rash, cyanosis, or clubbing.  Psychiatric: Normal mood and affect. speech and behavior is normal. Judgment and thought content normal. Cognition and memory are normal.   Labs on Admission:  Results for orders placed during the hospital encounter of 03/17/11 (from the past 48 hour(s))  CBC     Status: Abnormal   Collection Time   03/17/11  9:50 AM      Component Value Range Comment   WBC 11.6 (*) 4.0 - 10.5 (K/uL)    RBC 5.45  4.22 - 5.81 (MIL/uL)    Hemoglobin 17.8 (*) 13.0 - 17.0 (g/dL)    HCT 16.1  09.6 - 04.5 (%)    MCV 90.6  78.0 - 100.0 (fL)    MCH 32.7  26.0 - 34.0 (pg)    MCHC 36.0  30.0 - 36.0 (g/dL)    RDW 40.9  81.1 - 91.4 (%)    Platelets 161  150 - 400 (K/uL)   COMPREHENSIVE METABOLIC PANEL     Status: Abnormal   Collection Time   03/17/11  9:50 AM      Component Value Range Comment   Sodium 136  135 - 145 (mEq/L)    Potassium 4.7  3.5 - 5.1 (mEq/L)    Chloride 99  96 - 112 (mEq/L)    CO2 23  19 - 32 (mEq/L)    Glucose, Bld 74  70 - 99 (mg/dL)    BUN 12  6 - 23 (mg/dL)    Creatinine, Ser 7.82  0.50 - 1.35 (mg/dL)    Calcium 9.9  8.4 - 10.5 (mg/dL)    Total Protein 7.6  6.0 - 8.3 (g/dL)    Albumin 4.2  3.5 - 5.2 (g/dL)    AST 26  0 - 37 (U/L) HEMOLYSIS AT THIS LEVEL MAY AFFECT RESULT   ALT 20  0 - 53 (U/L)    Alkaline Phosphatase 103  39 - 117 (U/L)    Total Bilirubin 0.7  0.3 - 1.2 (mg/dL)    GFR calc non Af Amer 90 (*) >90 (mL/min)    GFR calc Af Amer >90  >90 (mL/min)   POCT I-STAT TROPONIN I     Status: Normal   Collection Time   03/17/11 10:31 AM      Component Value Range Comment   Troponin i, poc 0.01  0.00 - 0.08 (ng/mL)    Comment 3            ETHANOL     Status: Normal   Collection Time   03/17/11 11:25 AM      Component Value Range Comment   Alcohol, Ethyl (B) <11  0 - 11 (mg/dL)     Radiological Exams on  Admission: No results found.  Assessment/Plan  Active Problems:  Atrial Fibrillation - currently rate controlled on cardizem drip - cardiology is following - start cardizem 240 mg PO daily from tomorrow - no anticoagulation due to alcohol abuse  Disposition - telemetry - SW for placement   Time Spent on Admission: Greater than 30 minutes  Shelbe Haglund 03/17/2011, 3:06 PM

## 2011-03-17 NOTE — ED Notes (Signed)
Palpitations for weeks; he states, "r/t drinking much alcohol." last drink at midnight - beer. " sob, sweating.

## 2011-03-18 LAB — CBC
HCT: 43 % (ref 39.0–52.0)
Hemoglobin: 14.8 g/dL (ref 13.0–17.0)
RBC: 4.72 MIL/uL (ref 4.22–5.81)
RDW: 14.2 % (ref 11.5–15.5)
WBC: 7.6 10*3/uL (ref 4.0–10.5)

## 2011-03-18 LAB — CARDIAC PANEL(CRET KIN+CKTOT+MB+TROPI)
Relative Index: INVALID (ref 0.0–2.5)
Relative Index: INVALID (ref 0.0–2.5)
Troponin I: <0.3 ng/mL (ref <0.30)

## 2011-03-18 LAB — BASIC METABOLIC PANEL
BUN: 20 mg/dL (ref 6–23)
CO2: 26 mEq/L (ref 19–32)
Chloride: 103 mEq/L (ref 96–112)
GFR calc Af Amer: 82 mL/min — ABNORMAL LOW (ref >90)
Glucose, Bld: 83 mg/dL (ref 70–99)
Potassium: 4.5 mEq/L (ref 3.5–5.1)

## 2011-03-18 MED ORDER — NICOTINE 7 MG/24HR TD PT24
7.0000 mg | MEDICATED_PATCH | Freq: Every day | TRANSDERMAL | Status: DC
  Administered 2011-03-18 – 2011-03-19 (×2): 7 mg via TRANSDERMAL
  Filled 2011-03-18 (×3): qty 1

## 2011-03-18 MED ORDER — ZOLPIDEM TARTRATE 5 MG PO TABS
5.0000 mg | ORAL_TABLET | Freq: Every evening | ORAL | Status: DC | PRN
  Administered 2011-03-18 – 2011-03-19 (×2): 5 mg via ORAL
  Filled 2011-03-18 (×2): qty 1

## 2011-03-18 MED ORDER — ENOXAPARIN SODIUM 40 MG/0.4ML ~~LOC~~ SOLN
40.0000 mg | Freq: Every day | SUBCUTANEOUS | Status: DC
  Administered 2011-03-18 – 2011-03-19 (×2): 40 mg via SUBCUTANEOUS
  Filled 2011-03-18 (×3): qty 0.4

## 2011-03-18 MED ORDER — DILTIAZEM HCL ER COATED BEADS 240 MG PO CP24
240.0000 mg | ORAL_CAPSULE | Freq: Every day | ORAL | Status: DC
  Administered 2011-03-18: 240 mg via ORAL
  Filled 2011-03-18 (×2): qty 1

## 2011-03-18 NOTE — Progress Notes (Signed)
Utilization review completed. [name] 03/18/2011 

## 2011-03-18 NOTE — ED Notes (Signed)
Spoke with pt re: dcp arrangements.  Pt reports that he wants IP rehab for SA at d/c.  Pt was slated to go to either [facility] or [facility], but, for various reasons, those plans did not work out.  Covering CSW will inform Psych service line CSW of pt's desire for IP tx.  Pt states that he will drink alcohol at d/c if he doesn't have a rehab bed secured.  Pt further states that he will also need help with his medications at d/c as he has no money or insurance.  CM to be informed.  Emotional support offered to pt.

## 2011-03-18 NOTE — Progress Notes (Signed)
Subjective: Patient doing okay. His biggest complaint is of fatigue. Right now he says that his heart rate is well controlled, but he is concerned about exerting himself and that leading to elevated heart rate.  Objective: Weight change:   Intake/Output Summary (Last 24 hours) at 03/18/11 1949 Last data filed at 03/18/11 1752  Gross per 24 hour  Intake 1240.75 ml  Output   1350 ml  Net -109.25 ml    Filed Vitals:   03/18/11 1930  BP: 97/60  Pulse: 89  Temp: 97.4 F (36.3 C)  Resp: 20   General: Alert and oriented x3, mild distress from fatigue, looks older than stated age HEENT: Normocephalic and atraumatic, mucous membranes are moist Cardiovascular: Irregular rhythm, rate controlled Lungs:" Bilaterally Abdomen: Soft, nontender, nondistended, positive bowel sounds History: No clubbing or cyanosis, trace pitting edema  Lab Results: Basic Metabolic Panel:  Basename 03/18/11 0020 03/17/11 1518  NA 136 137  K 4.5 3.7  CL 103 102  CO2 26 25  GLUCOSE 83 145*  BUN 20 13  CREATININE 1.08 0.89  CALCIUM 8.6 8.9  MG -- 1.9  PHOS -- 2.6   Liver Function Tests:  Basename 03/17/11 1518 03/17/11 0950  AST 19 26  ALT 16 20  ALKPHOS 90 103  BILITOT 0.6 0.7  PROT 6.5 7.6  ALBUMIN 3.5 4.2   CBC:  Basename 03/18/11 0020 03/17/11 1518  WBC 7.6 7.1  NEUTROABS -- 3.8  HGB 14.8 16.3  HCT 43.0 45.1  MCV 91.1 90.2  PLT 147* 161   Cardiac Enzymes:  Basename 03/18/11 0520 03/18/11 0019 03/17/11 1519  CKTOTAL 43 48 54  CKMB 1.1 1.4 1.5  CKMBINDEX -- -- --  TROPONINI <0.30 <0.30 <0.30   Hemoglobin A1C:  Basename 03/17/11 1518  HGBA1C 5.5   Thyroid Function Tests:  Basename 03/17/11 1518  TSH 1.563  T4TOTAL --  FREET4 --  T3FREE --  THYROIDAB --   Urine Drug Screen: Drugs of Abuse   Alcohol Level:  Basename 03/17/11 1125  ETH <11      Medications: Scheduled Meds:   . aspirin EC  81 mg Oral Daily  . diltiazem  240 mg Oral Daily  . enoxaparin  (LOVENOX) injection  40 mg Subcutaneous QHS  . folic acid  1 mg Oral Daily  . mulitivitamin with minerals  1 tablet Oral Daily  . nicotine  7 mg Transdermal Once  . thiamine  100 mg Oral Daily   Or  . thiamine  100 mg Intravenous Daily  . zolpidem  5 mg Oral Once   Continuous Infusions:   . sodium chloride 10 mL/hr at 03/17/11 1737  . DISCONTD: diltiazem (CARDIZEM) infusion 5 mg/hr (03/17/11 1859)   PRN Meds:.LORazepam, LORazepam  Assessment/Plan: Patient Active Hospital Problem List: Atrial fibrillation with RVR (03/17/2011)  doing better. Off of IV Cardizem. His normal dose of Cardizem at 120 mg by mouth daily was doubled to 240 mg, he is tolerating this well.  HEPATITIS B (01/17/2009)  stable.  ALCOHOL ABUSE (01/18/2009)  patient wants inpatient counseling. Social worker working to see this is possible.  TOBACCO ABUSE (01/18/2009)  on nicotine patch. Stable.  DEPRESSION (01/17/2009)  stable.    LOS: 1 day   Hollice Espy 03/18/2011, 7:49 PM

## 2011-03-18 NOTE — Progress Notes (Signed)
@   Subjective:  Denies CP or dyspnea   Objective:  Filed Vitals:   03/18/11 0100 03/18/11 0420 03/18/11 0600 03/18/11 0800  BP:  108/71  100/68  Pulse: 80 82 72 74  Temp:  98.9 F (37.2 C)    TempSrc:      Resp:  18    Height:      Weight:  203 lb 7.8 oz (92.3 kg)    SpO2:  95%      Intake/Output from previous day:  Intake/Output Summary (Last 24 hours) at 03/18/11 1005 Last data filed at 03/18/11 1478  Gross per 24 hour  Intake  702.5 ml  Output   1000 ml  Net -297.5 ml    Physical Exam: Physical exam: Well-developed well-nourished in no acute distress.  Skin is warm and dry.  HEENT is normal.  Neck is supple. No thyromegaly.  Chest is clear to auscultation with normal expansion.  Cardiovascular exam is irregular Abdominal exam nontender or distended. No masses palpated. Extremities show no edema. neuro grossly intact    Lab Results: Basic Metabolic Panel:  Basename 03/18/11 0020 03/17/11 1518  NA 136 137  K 4.5 3.7  CL 103 102  CO2 26 25  GLUCOSE 83 145*  BUN 20 13  CREATININE 1.08 0.89  CALCIUM 8.6 8.9  MG -- 1.9  PHOS -- 2.6   CBC:  Basename 03/18/11 0020 03/17/11 1518  WBC 7.6 7.1  NEUTROABS -- 3.8  HGB 14.8 16.3  HCT 43.0 45.1  MCV 91.1 90.2  PLT 147* 161   Cardiac Enzymes:  Basename 03/18/11 0019 03/17/11 1519  CKTOTAL 48 54  CKMB 1.4 1.5  CKMBINDEX -- --  TROPONINI <0.30 <0.30     Assessment/Plan:  1) afib; dc iv cardizem and begin po ( 240 daily); continue asa. No coumadin given ETOH and noncompliance 2) CP - enzymes neg; no further ischemia eval 3) ETOH - needs rehab; this is his predominant issue; primary care will make arrangements; long term prognosis is poor unless he abstains. We will sign off. Please call with questions  [name] 03/18/2011, 10:05 AM

## 2011-03-19 MED ORDER — DILTIAZEM HCL ER COATED BEADS 180 MG PO CP24
180.0000 mg | ORAL_CAPSULE | Freq: Every day | ORAL | Status: DC
  Administered 2011-03-19: 180 mg via ORAL
  Filled 2011-03-19 (×2): qty 1

## 2011-03-19 MED ORDER — DILTIAZEM HCL ER COATED BEADS 180 MG PO CP24: 180.0000 mg | ORAL_CAPSULE | Freq: Every day | ORAL | Status: DC

## 2011-03-19 NOTE — Progress Notes (Signed)
Physical Therapy Evaluation Patient Details Name: [name] MRN: [id number] DOB: 08-Feb-1946 Today's Date: 03/19/2011  Problem List:  Patient Active Problem List  Diagnoses  . HEPATITIS B  . ALCOHOL ABUSE  . TOBACCO ABUSE  . DEPRESSION  . Atrial fibrillation  . HEMORRHOIDS  . CHEST PAIN  . Atrial fibrillation with RVR    Past Medical History:  Past Medical History  Diagnosis Date  . Atrial fibrillation   . Angina   . Shortness of breath   . Hepatitis     hepatitis B  . Palpitations   . Hypertension    Past Surgical History:  Past Surgical History  Procedure Date  . Mouth surgery     PT Assessment/Plan/Recommendation PT Assessment Clinical Impression Statement: Pt presents to [facility] with ETOH withdrawl, AF and chest pain. PT eval complete. Pt at baseline mobility with no further PT needs at this time. PT signing off. Pt encouraged to continue ambulating with nsg daily.  PT Recommendation/Assessment: Patent does not need any further PT services No Skilled PT: Patient at baseline level of functioning;Patient is modified independent with all activity/mobility;Patient is independent with all acitivity/mobility PT Recommendation Follow Up Recommendations: No PT follow up Equipment Recommended: None recommended by PT PT Goals     PT Evaluation Precautions/Restrictions    Prior Functioning  Home Living Lives With: Alone Type of Home: House Home Layout: One level Home Access: Stairs to enter Entrance Stairs-Rails: Right Entrance Stairs-Number of Steps: 3 Home Adaptive Equipment: None Prior Function Level of Independence: Independent with basic ADLs;Independent with homemaking with ambulation;Independent with gait;Independent with transfers Driving: No (doesn't have a car) Vocation: Unemployed Financial risk analyst Arousal/Alertness: Awake/alert Overall Cognitive Status: Appears within functional limits for tasks assessed Orientation Level: Oriented  X4 Sensation/Coordination Sensation Light Touch: Appears Intact Coordination Gross Motor Movements are Fluid and Coordinated: Yes Fine Motor Movements are Fluid and Coordinated: Yes Extremity Assessment RUE Assessment RUE Assessment: Within Functional Limits LUE Assessment LUE Assessment: Within Functional Limits RLE Assessment RLE Assessment: Within Functional Limits LLE Assessment LLE Assessment: Within Functional Limits Mobility (including Balance) Bed Mobility Bed Mobility: Yes Supine to Sit: 7: Independent Sit to Supine: 7: Independent Transfers Transfers: Yes Sit to Stand: 7: Independent Stand to Sit: 7: Independent Ambulation/Gait Ambulation/Gait: Yes Ambulation/Gait Assistance: 6: Modified independent (Device/Increase time) Ambulation Distance (Feet): 180 Feet Assistive device: None Gait Pattern: Decreased stride length Gait velocity: decreased gait velocity but not measured Stairs: Yes Stairs Assistance: 6: Modified independent (Device/Increase time) Stair Management Technique: One rail Right Number of Stairs: 3   Posture/Postural Control Posture/Postural Control: No significant limitations Balance Balance Assessed: Yes Static Standing Balance Static Standing - Balance Support: No upper extremity supported Static Standing - Level of Assistance: 7: Independent Static Standing - Comment/# of Minutes: pt stood at sink independently to perform ADLs, no LOB noted and no gross instabilty noted Dynamic Standing Balance Dynamic Standing - Balance Support: No upper extremity supported Dynamic Standing - Level of Assistance: 7: Independent High Level Balance High Level Balance Comments: pt ambulates hallway with slightly decreased gait speed but no gross instability or LOB noted Exercise    End of Session PT - End of Session Equipment Utilized During Treatment:  (pt refusing use of gait belt) Activity Tolerance: Patient tolerated treatment well Patient left: in  bed Nurse Communication: Mobility status for transfers;Mobility status for ambulation General Behavior During Session: [name] for tasks performed Cognition: [facility] for tasks performed  [name] 03/19/2011, 10:58 AM

## 2011-03-19 NOTE — Progress Notes (Signed)
Clinical Social Worker continuing to follow for discharge planning. Clinical Social Worker reviewed chart and pt had been seen by psych CSW on 03/13/2011 and [facility] CSW on 03/15/2011. Per previous notes, pt was scheduled to go to [facility] on 03/13/2010 and for various reasons the pt reported that he did not make it to [facility]. Pt had been previously set up by [facility] CSW for an appointment at [facility] Wednesday March 19, 2010.   Clinical Social Worker met with patient at length to discuss pt discharge plans.Clinical Social Worker made referral to [facility] at request of the patient and [facility] is unable to offer a bed due to patient medical needs. Clinical Social Worker contacted [facility] and confirmed that pt remains on the waiting list for treatment and pt must call once a week to confirm he is still interested in treatment and pt is aware of this and has contact information. Clinical Social Worker confirmed with [facility] that pt has an intake appointment at 8 am on January 9th and per [facility], they will arrange for pt to go to a shelter after intake interview and remain in the shelter until pt can be admitted to [facility]. Clinical Child psychotherapist discussed with pt plan to discharge in the morning to [facility] and then go to a shelter from [place]. Pt expressed concern about medications and per CM, pt has exhausted Building control surveyor. Clinical Social Worker advised pt that in the event of a medical emergency that pt can return to any ED. Pt expressed concern about being tempted to drink if on the street and Clinical Social Worker provided support and advised patient to speak with [facility] intake worker about concerns. Clinical Social Worker provided  cab voucher to Nurse, children's will call cab in the morning for pt to be transported directly to [facility]. MD aware of pt plan for discharge to [facility] early Wednesday morning.    Jacklynn Lewis, MSW, LCSWA  Clinical Social Work [phone]

## 2011-03-19 NOTE — Progress Notes (Signed)
@   Subjective:  "No sleep"; Denies CP or dyspnea   Objective:  Filed Vitals:   03/18/11 1930 03/19/11 0000 03/19/11 0500 03/19/11 0600  BP: 97/60 109/68 111/67 117/76  Pulse: 89 86 77 51  Temp: 97.4 F (36.3 C) 97.6 F (36.4 C) 97.1 F (36.2 C)   TempSrc: Oral Axillary Oral   Resp: 20 20 20    Height:      Weight:   201 lb 12.8 oz (91.536 kg)   SpO2: 98% 96% 96%     Intake/Output from previous day:  Intake/Output Summary (Last 24 hours) at 03/19/11 0704 Last data filed at 03/18/11 2147  Gross per 24 hour  Intake   1122 ml  Output    950 ml  Net    172 ml    Physical Exam: Physical exam: Well-developed well-nourished in no acute distress.  Skin is warm and dry.  HEENT is normal.  Neck is supple. No thyromegaly.  Chest is clear to auscultation with normal expansion.  Cardiovascular exam is irregular Abdominal exam nontender or distended. No masses palpated. Extremities show no edema. neuro grossly intact    Lab Results: Basic Metabolic Panel:  Basename 03/18/11 0020 03/17/11 1518  NA 136 137  K 4.5 3.7  CL 103 102  CO2 26 25  GLUCOSE 83 145*  BUN 20 13  CREATININE 1.08 0.89  CALCIUM 8.6 8.9  MG -- 1.9  PHOS -- 2.6   CBC:  Basename 03/18/11 0020 03/17/11 1518  WBC 7.6 7.1  NEUTROABS -- 3.8  HGB 14.8 16.3  HCT 43.0 45.1  MCV 91.1 90.2  PLT 147* 161   Cardiac Enzymes:  Basename 03/18/11 0520 03/18/11 0019 03/17/11 1519  CKTOTAL 43 48 54  CKMB 1.1 1.4 1.5  CKMBINDEX -- -- --  TROPONINI <0.30 <0.30 <0.30     Assessment/Plan:  1) afib; HR decreased last PM; change cardizem CD to 180 mg daily; adjust as needed to control HR; continue asa. No coumadin given ETOH and noncompliance 2) CP - enzymes neg; no further ischemia eval 3) ETOH - needs rehab; this is his predominant issue; primary care will make arrangements; long term prognosis is poor unless he abstains. We will sign off. Please call with questions  [name] 03/19/2011, 7:04  AM

## 2011-03-19 NOTE — Discharge Summary (Signed)
DISCHARGE SUMMARY  [name]  MR#: [id number]  DOB:07-07-1945  Date of Admission: 03/17/2011 Date of Discharge: 03/19/2011  Attending Physician:Stephan Nelis K  Patient's PCP: Previous appointments at health service clinic  Consults: -none  Discharge Diagnoses: Present on Admission:  .Atrial fibrillation with RVR .HEPATITIS B .ALCOHOL ABUSE .TOBACCO ABUSE .DEPRESSION    Current Discharge Medication List    CONTINUE these medications which have CHANGED   Details  diltiazem (CARDIZEM CD) 180 MG 24 hr capsule Take 1 capsule (180 mg total) by mouth daily. Qty: 30 capsule, Refills: 0      CONTINUE these medications which have NOT CHANGED   Details  aspirin EC 81 MG tablet Take 81 mg by mouth daily.          Hospital Course: Present on Admission:  .Atrial fibrillation with RVR: Patient is a 66 year old white male who is homeless with past oral history alcohol abuse, hepatitis B and atrial fibrillation who presented with shortness of breath and palpitations and chest pain. Enzymes x3 negative but he was found to be in rapid atrial fibrillation. He was initially started on a Cardizem drip and his rate was controlled. He was evaluated by cardiology who increased his Cardizem from 120 mg by mouth daily to 240 mg by mouth daily. Patient active he started having some episodes of bradycardia and he was changed to 180 mg by mouth daily. Currently his heart has been stable for 24 hours and he's felt to be medically stable for discharge.  Marland KitchenHEPATITIS B: Stable during this admission.  .ALCOHOL ABUSE: Patient is a previous history of long-standing alcohol abuse. He was recently discharged from the hospital service and at that time had been set for an outpatient appointment with alcohol counseling but he skipped his appointment. He comes back in and requested behavioral health inpatient rehabilitation which is not available. The plan will be that he has an intake assessment at day [facility]  facility tomorrow 03/20/11 at 8 AM. Transportation arrangements have been made and the patient be discharged at 7 AM. Following his intake he'll be transported to a homeless shelter who will keep him until day Loraine Leriche can fully taken in. No episodes of alcohol withdrawal but his hospitalization.  .TOBACCO ABUSE: [facility]. Nicotine patch in his hospitalization.  Marland KitchenDEPRESSION: Stable during this hospitalization.  Day of Discharge BP 111/67  Pulse 77  Temp(Src) 97.3 F (36.3 C) (Oral)  Resp 18  Ht 6\' 6"  (1.981 m)  Wt 91.536 kg (201 lb 12.8 oz)  BMI 23.32 kg/m2  SpO2 100%  Physical Exam: General: Alert and oriented x3, fatigue, mild distress secondary to fatigue Cardiovascular: Irregular rhythm, rate controlled next one month:" Bilaterally Abdomen: Soft, nontender, nondistended, positive bowel sounds Extremities, clubbing or cyanosis or edema.   Disposition: Improved, being discharged to home with shelter after initial intake at day [facility]   Follow-up Appts: Discharge Orders    Future Orders Please Complete By Expires   Diet - low sodium heart healthy      Increase activity slowly         Follow-up Information    Follow up with Outpatient alcohol rehabilitation at day [place] on 03/20/2011. (8 am)          Tests Needing Follow-up: None  Time spent in discharge (includes decision making & examination of pt): 40 minutes  Signed: Hollice Espy 03/19/2011, 11:15 PM

## 2011-03-19 NOTE — Discharge Instructions (Signed)
Atrial Fibrillation °Your caregiver has diagnosed you with atrial fibrillation (AFib). The heart normally beats very regularly; AFib is a type of irregular heartbeat. The heart rate may be faster or slower than normal. This can prevent your heart from pumping as well as it should. AFib can be constant (chronic) or intermittent (paroxysmal). °CAUSES  °Atrial fibrillation may be caused by: °· Heart disease, including heart attack, coronary artery disease, heart failure, diseases of the heart valves, and others.  °· Blood clot in the lungs (pulmonary embolism).  °· Pneumonia or other infections.  °· Chronic lung disease.  °· Thyroid disease.  °· Toxins. These include alcohol, some medications (such as decongestant medications or diet pills), and caffeine.  °In some people, no cause for AFib can be found. This is referred to as Lone Atrial Fibrillation. °SYMPTOMS  °· Palpitations or a fluttering in your chest.  °· A vague sense of chest discomfort.  °· Shortness of breath.  °· Sudden onset of lightheadedness or weakness.  °Sometimes, the first sign of AFib can be a complication of the condition. This could be a stroke or heart failure. °DIAGNOSIS  °Your description of your condition may make your caregiver suspicious of atrial fibrillation. Your caregiver will examine your pulse to determine if fibrillation is present. An EKG (electrocardiogram) will confirm the diagnosis. Further testing may help determine what caused you to have atrial fibrillation. This may include chest x-ray, echocardiogram, blood tests, or CT scans. °PREVENTION  °If you have previously had atrial fibrillation, your caregiver may advise you to avoid substances known to cause the condition (such as stimulant medications, and possibly caffeine or alcohol). You may be advised to use medications to prevent recurrence. Proper treatment of any underlying condition is important to help prevent recurrence. °PROGNOSIS  °Atrial fibrillation does tend to  become a chronic condition over time. It can cause significant complications (see below). Atrial fibrillation is not usually immediately life-threatening, but it can shorten your life expectancy. This seems to be worse in women. If you have lone atrial fibrillation and are under 60 years old, the risk of complications is very low, and life expectancy is not shortened. °RISKS AND COMPLICATIONS  °Complications of atrial fibrillation can include stroke, chest pain, and heart failure. Your caregiver will recommend treatments for the atrial fibrillation, as well as for any underlying conditions, to help minimize risk of complications. °TREATMENT  °Treatment for AFib is divided into several categories: °· Treatment of any underlying condition.  °· Converting you out of AFib into a regular (sinus) rhythm.  °· Controlling rapid heart rate.  °· Prevention of blood clots and stroke.  °Medications and procedures are available to convert your atrial fibrillation to sinus rhythm. However, recent studies have shown that this may not offer you any advantage, and cardiac experts are continuing research and debate on this topic. °More important is controlling your rapid heartbeat. The rapid heartbeat causes more symptoms, and places strain on your heart. Your caregiver will advise you on the use of medications that can control your heart rate. °Atrial fibrillation is a strong stroke risk. You can lessen this risk by taking blood thinning medications such as Coumadin (warfarin), or sometimes aspirin. These medications need close monitoring by your caregiver. Over-medication can cause bleeding. Too little medication may not protect against stroke. °HOME CARE INSTRUCTIONS  °· If your caregiver prescribed medicine to make your heartbeat more normally, take as directed.  °· If blood thinners were prescribed by your caregiver, take EXACTLY as directed.  °·   Perform blood tests EXACTLY as directed.  °· Quit smoking. Smoking increases your  cardiac and lung (pulmonary) risks.  °· DO NOT drink alcohol.  °· DO NOT drink caffeinated drinks (e.g. coffee, soda, chocolate, and leaf teas). You may drink decaffeinated coffee, soda or tea.  °· If you are overweight, you should choose a reduced calorie diet to lose weight. Please see a registered dietitian if you need more information about healthy weight loss. DO NOT USE DIET PILLS as they may aggravate heart problems.  °· If you have other heart problems that are causing AFib, you may need to eat a low salt, fat, and cholesterol diet. Your caregiver will tell you if this is necessary.  °· Exercise every day to improve your physical fitness. Stay active unless advised otherwise.  °· If your caregiver has given you a follow-up appointment, it is very important to keep that appointment. Not keeping the appointment could result in heart failure or stroke. If there is any problem keeping the appointment, you must call back to this facility for assistance.  °SEEK MEDICAL CARE IF: °· You notice a change in the rate, rhythm or strength of your heartbeat.  °· You develop an infection or any other change in your overall health status.  °SEEK IMMEDIATE MEDICAL CARE IF:  °· You develop chest pain, abdominal pain, sweating, weakness or feel sick to your stomach (nausea).  °· You develop shortness of breath.  °· You develop swollen feet and ankles.  °· You develop dizziness, numbness, or weakness of your face or limbs, or any change in vision or speech.  °MAKE SURE YOU:  °· Understand these instructions.  °· Will watch your condition.  °· Will get help right away if you are not doing well or get worse.  °Document Released: 02/25/2005 Document Revised: 11/07/2010 Document Reviewed: 09/30/2007 °ExitCare® Patient Information ©2012 [employer]. °

## 2011-03-20 ENCOUNTER — Inpatient Hospital Stay (HOSPITAL_COMMUNITY)
Admission: EM | Admit: 2011-03-20 | Discharge: 2011-03-27 | DRG: 309 | Disposition: A | Discharge status: Home / Self Care | Payer: MEDICAID | Attending: Internal Medicine | Admitting: Internal Medicine

## 2011-03-20 ENCOUNTER — Emergency Department (HOSPITAL_COMMUNITY): Payer: Self-pay

## 2011-03-20 ENCOUNTER — Encounter (HOSPITAL_COMMUNITY): Payer: Self-pay | Admitting: *Deleted

## 2011-03-20 DIAGNOSIS — F329 Major depressive disorder, single episode, unspecified: Secondary | ICD-10-CM | POA: Diagnosis present

## 2011-03-20 DIAGNOSIS — K649 Unspecified hemorrhoids: Secondary | ICD-10-CM | POA: Insufficient documentation

## 2011-03-20 DIAGNOSIS — F17200 Nicotine dependence, unspecified, uncomplicated: Secondary | ICD-10-CM | POA: Diagnosis present

## 2011-03-20 DIAGNOSIS — R079 Chest pain, unspecified: Secondary | ICD-10-CM

## 2011-03-20 DIAGNOSIS — I10 Essential (primary) hypertension: Secondary | ICD-10-CM | POA: Diagnosis present

## 2011-03-20 DIAGNOSIS — F1010 Alcohol abuse, uncomplicated: Secondary | ICD-10-CM | POA: Diagnosis present

## 2011-03-20 DIAGNOSIS — I4891 Unspecified atrial fibrillation: Principal | ICD-10-CM | POA: Diagnosis present

## 2011-03-20 DIAGNOSIS — I426 Alcoholic cardiomyopathy: Secondary | ICD-10-CM

## 2011-03-20 DIAGNOSIS — I509 Heart failure, unspecified: Secondary | ICD-10-CM | POA: Diagnosis present

## 2011-03-20 DIAGNOSIS — I498 Other specified cardiac arrhythmias: Secondary | ICD-10-CM | POA: Diagnosis present

## 2011-03-20 DIAGNOSIS — F3289 Other specified depressive episodes: Secondary | ICD-10-CM | POA: Diagnosis present

## 2011-03-20 DIAGNOSIS — B1910 Unspecified viral hepatitis B without hepatic coma: Secondary | ICD-10-CM | POA: Diagnosis present

## 2011-03-20 DIAGNOSIS — I429 Cardiomyopathy, unspecified: Secondary | ICD-10-CM | POA: Diagnosis present

## 2011-03-20 HISTORY — DX: Unspecified hemorrhoids: K64.9

## 2011-03-20 HISTORY — DX: Alcoholic cardiomyopathy: I42.6

## 2011-03-20 HISTORY — DX: Alcohol abuse, uncomplicated: F10.10

## 2011-03-20 LAB — BASIC METABOLIC PANEL
CO2: 24 mEq/L (ref 19–32)
Chloride: 102 mEq/L (ref 96–112)
Creatinine, Ser: 0.93 mg/dL (ref 0.50–1.35)
GFR calc Af Amer: >90 mL/min (ref >90)
Potassium: 4.7 mEq/L (ref 3.5–5.1)
Sodium: 136 mEq/L (ref 135–145)

## 2011-03-20 LAB — URINALYSIS, ROUTINE W REFLEX MICROSCOPIC
Glucose, UA: NEGATIVE mg/dL
Leukocytes, UA: NEGATIVE
Nitrite: NEGATIVE
Specific Gravity, Urine: 1.016 (ref 1.005–1.030)
pH: 7 (ref 5.0–8.0)

## 2011-03-20 LAB — DIFFERENTIAL
Basophils Absolute: 0 10*3/uL (ref 0.0–0.1)
Lymphocytes Relative: 33 % (ref 12–46)
Monocytes Absolute: 0.7 10*3/uL (ref 0.1–1.0)
Neutro Abs: 4.2 10*3/uL (ref 1.7–7.7)
Neutrophils Relative %: 57 % (ref 43–77)

## 2011-03-20 LAB — HEPATIC FUNCTION PANEL
AST: 20 U/L (ref 0–37)
Albumin: 3.5 g/dL (ref 3.5–5.2)
Total Bilirubin: 0.3 mg/dL (ref 0.3–1.2)

## 2011-03-20 LAB — CBC
HCT: 47.8 % (ref 39.0–52.0)
RDW: 14.1 % (ref 11.5–15.5)
WBC: 7.3 10*3/uL (ref 4.0–10.5)

## 2011-03-20 LAB — ETHANOL: Alcohol, Ethyl (B): <11 mg/dL (ref 0–11)

## 2011-03-20 LAB — CARDIAC PANEL(CRET KIN+CKTOT+MB+TROPI)
CK, MB: 1 ng/mL (ref 0.3–4.0)
Troponin I: <0.3 ng/mL (ref <0.30)

## 2011-03-20 LAB — RAPID URINE DRUG SCREEN, HOSP PERFORMED
Benzodiazepines: NOT DETECTED
Cocaine: NOT DETECTED
Opiates: NOT DETECTED

## 2011-03-20 MED ORDER — ACETAMINOPHEN 650 MG RE SUPP: 650.0000 mg | Freq: Four times a day (QID) | RECTAL | Status: DC | PRN

## 2011-03-20 MED ORDER — LORAZEPAM 0.5 MG PO TABS
0.5000 mg | ORAL_TABLET | Freq: Once | ORAL | Status: AC
  Administered 2011-03-21: 0.5 mg via ORAL
  Filled 2011-03-20: qty 1

## 2011-03-20 MED ORDER — ENOXAPARIN SODIUM 40 MG/0.4ML ~~LOC~~ SOLN
40.0000 mg | SUBCUTANEOUS | Status: DC
  Administered 2011-03-20 – 2011-03-26 (×7): 40 mg via SUBCUTANEOUS
  Filled 2011-03-20 (×8): qty 0.4

## 2011-03-20 MED ORDER — DILTIAZEM HCL 50 MG/10ML IV SOLN
20.0000 mg | Freq: Once | INTRAVENOUS | Status: AC
  Administered 2011-03-20: 20 mg via INTRAVENOUS
  Filled 2011-03-20: qty 4

## 2011-03-20 MED ORDER — ONDANSETRON HCL 4 MG/2ML IJ SOLN: 4.0000 mg | Freq: Four times a day (QID) | INTRAMUSCULAR | Status: DC | PRN

## 2011-03-20 MED ORDER — LORAZEPAM 2 MG/ML IJ SOLN
0.5000 mg | INTRAMUSCULAR | Status: DC | PRN
  Administered 2011-03-21 – 2011-03-25 (×11): 0.5 mg via INTRAVENOUS
  Filled 2011-03-20 (×12): qty 1

## 2011-03-20 MED ORDER — ZOLPIDEM TARTRATE 5 MG PO TABS
5.0000 mg | ORAL_TABLET | Freq: Once | ORAL | Status: AC
  Administered 2011-03-21: 5 mg via ORAL
  Filled 2011-03-20: qty 1

## 2011-03-20 MED ORDER — ACETAMINOPHEN 325 MG PO TABS: 650.0000 mg | ORAL_TABLET | Freq: Four times a day (QID) | ORAL | Status: DC | PRN

## 2011-03-20 MED ORDER — DILTIAZEM HCL 60 MG PO TABS
60.0000 mg | ORAL_TABLET | ORAL | Status: AC
  Filled 2011-03-20: qty 1

## 2011-03-20 MED ORDER — DILTIAZEM HCL 60 MG PO TABS
60.0000 mg | ORAL_TABLET | Freq: Three times a day (TID) | ORAL | Status: DC
  Administered 2011-03-21 – 2011-03-22 (×3): 60 mg via ORAL
  Filled 2011-03-20 (×7): qty 1

## 2011-03-20 MED ORDER — DILTIAZEM HCL ER COATED BEADS 180 MG PO CP24: 180.0000 mg | ORAL_CAPSULE | Freq: Every day | ORAL | Status: DC

## 2011-03-20 MED ORDER — DILTIAZEM HCL 100 MG IV SOLR
10.0000 mg/h | INTRAVENOUS | Status: DC
  Administered 2011-03-20: 10 mg/h via INTRAVENOUS
  Filled 2011-03-20: qty 100

## 2011-03-20 MED ORDER — DILTIAZEM HCL 60 MG PO TABS: 60.0000 mg | ORAL_TABLET | Freq: Three times a day (TID) | ORAL | Status: DC

## 2011-03-20 MED ORDER — LORAZEPAM 1 MG PO TABS
1.0000 mg | ORAL_TABLET | Freq: Once | ORAL | Status: AC
  Administered 2011-03-20: 1 mg via ORAL
  Filled 2011-03-20: qty 1

## 2011-03-20 MED ORDER — LEVALBUTEROL HCL 0.63 MG/3ML IN NEBU
0.6300 mg | INHALATION_SOLUTION | Freq: Four times a day (QID) | RESPIRATORY_TRACT | Status: DC | PRN
  Filled 2011-03-20: qty 3

## 2011-03-20 MED ORDER — ASPIRIN EC 81 MG PO TBEC
81.0000 mg | DELAYED_RELEASE_TABLET | Freq: Every day | ORAL | Status: DC
  Administered 2011-03-20 – 2011-03-26 (×6): 81 mg via ORAL
  Filled 2011-03-20 (×8): qty 1

## 2011-03-20 MED ORDER — LORAZEPAM 2 MG/ML IJ SOLN: 0.5000 mg | INTRAMUSCULAR | Status: DC

## 2011-03-20 MED ORDER — DILTIAZEM HCL 100 MG IV SOLR
5.0000 mg/h | INTRAVENOUS | Status: DC
  Administered 2011-03-20: 10 mg/h via INTRAVENOUS

## 2011-03-20 MED ORDER — DILTIAZEM HCL ER COATED BEADS 180 MG PO CP24
180.0000 mg | ORAL_CAPSULE | Freq: Every day | ORAL | Status: DC
  Filled 2011-03-20: qty 1

## 2011-03-20 MED ORDER — ONDANSETRON HCL 4 MG PO TABS: 4.0000 mg | ORAL_TABLET | Freq: Four times a day (QID) | ORAL | Status: DC | PRN

## 2011-03-20 MED ORDER — ASPIRIN EC 81 MG PO TBEC
81.0000 mg | DELAYED_RELEASE_TABLET | ORAL | Status: AC
  Filled 2011-03-20: qty 1

## 2011-03-20 NOTE — ED Notes (Signed)
2921-01 Ready

## 2011-03-20 NOTE — Progress Notes (Signed)
Clinical Social Worker continuing to follow for discharge planning. Clinical Social Worker reviewed chart and pt had been seen by psych CSW on 03/13/2011 and [facility] CSW on 03/15/2011. Per previous notes, pt was scheduled to go to [facility] on 03/13/2010 and for various reasons the pt reported that he did not make it to [facility]. Pt had been previously set up by [facility] CSW for an appointment at [facility] Wednesday March 19, 2010.  Clinical Social Worker met with patient at length to discuss pt discharge plans.Clinical Social Worker made referral to [facility] at request of the patient and [facility] is unable to offer a bed due to patient medical needs. Clinical Social Worker contacted [employer] [facility] and confirmed that pt remains on the waiting list for treatment and pt must call once a week to confirm he is still interested in treatment and pt is aware of this and has contact information. Clinical Social Worker confirmed with [facility] that pt has an intake appointment at 8 am on January 9th and per [facility], they will arrange for pt to go to a shelter after intake interview and remain in the shelter until pt can be admitted to [facility]. Clinical Child psychotherapist discussed with pt plan to discharge in the morning to [facility] and then go to a shelter from [place]. Pt expressed concern about medications and per CM, pt has exhausted Building control surveyor. Clinical Social Worker advised pt that in the event of a medical emergency that pt can return to any ED. Pt expressed concern about being tempted to drink if on the street and Clinical Social Worker provided support and advised patient to speak  with [facility] intake worker about concerns. Clinical Social Worker provided cab voucher to Nurse, children's will call cab in the morning for pt to be transported directly to [facility]. MD aware of pt plan for discharge to [facility] early Wednesday morning.  Jacklynn Lewis, MSW, LCSWA  Clinical Social Work  [phone]    Please review note above and note on 1/2-1/6 and be aware CSW department has exhausted all resources for patient.  Patient was evaluated on the inpatient twice by Psych MD regarding placement at [facility] and he was denied.  Patient was set up with [facility] and [facility] residential treatment facilities for substance abuse and alcohol abuse however patient failed to attend or follow up.  Patient again presented to the ED reporting no where to go and homeless in which patient was again referred for in patient treatment and again given resources for homelessness, shelters, bus passes.  Patient was dc this morning at 8 am.  Plan is for patient to have the motivation to follow up with [facility] in which he reports he has an admission on January 17th.  In the meantime he reports he will get paid with his disability check on January 16 and he can also go to [facility] in [place] after 5pm this evening.  Contact CSW if needs arise.  Ashley Jacobs, MSW LCSW [phone]

## 2011-03-20 NOTE — ED Notes (Signed)
3743-01 Ready 

## 2011-03-20 NOTE — Progress Notes (Signed)
Late Entry Note- Clinical Social Worker had arranged for pt to have cab arranged to transfer to [facility] by RN to make scheduled appointment time at 8 am. Clinical Social Worker contacted Social research officer, government at [employer] am to confirm cab transportation would be arranged previously mentioned cab transport. When Clinical Social Worker arrived at hospital, transportation had not been arranged and unit was unable to provide a concrete reason that transport was not called, therefore pt did not arrive at scheduled for appointment. However, Clinical Social Worker contacted [facility] to inform that pt would be late for intake meeting and that this Clinical Social Worker was unable to speak to someone at [facility] [employer] [facility]. With regards to pt going to shelter after intake interview and [facility] stated that they would arrange with [facility] for pt to go to shelter after intake interview and would provide transportation for pt. Clinical Social Worker arranged pt transfer to [facility] by cab.   Clinical Social Worker received phone call from [employer] at 9:55am stating that pt was brought to shelter by [facility] without medications. Clinical Social Worker discussed that pt had exhausted all of his resources for the hospital to provide pt medication and pt would have to obtain medication from local pharmacy. At this time, shelter also relayed that they were unable to provide housing assistance for pt as the shelter was full and pt was not a resident of [facility]. Shelter representative then transported patient back to [facility], where it was identified that pt was in A-fib. [facility] contacted this social worker to inform that patient was returning to Emergency Department and this Clinical Social Worker contacted ED social worker to make her aware of pt history and social concerns.  Please see ED social Worker note in regard to patient updates.  Jacklynn Lewis, MSW, LCSWA  Clinical Social Work [phone]

## 2011-03-20 NOTE — ED Notes (Signed)
Pt presents with onset of palpitations and increased anxiety after being discharged from here this morning at 0830.  Pt reports he has been transported to [facility], to facility in [facility] and back to [facility] then referred here.  Pt reports he was admitted for detox from alcohol and atrial fib, had cardizem increased from 120mg  to 180mg . Pt denies any etoh use today.  Pt has poor affect, poor eye contact, but is pleasant, good historian.

## 2011-03-20 NOTE — Plan of Care (Signed)
Problem: Consults Goal: Substance Abuse Patient Education See Patient Education Module for education specifics. Outcome: Progressing Pt abuses ETOH and Tobacco.  Problem: Alteration in mood & ability to function due to Goal: LTG-Patient demonstrates decreased signs of withdrawal (Patient demonstrates decreased signs of withdrawal to the point the patient is safe to return home and continue treatment in an outpatient setting) Outcome: Progressing No substance use for the past 10 hrs per pt.

## 2011-03-20 NOTE — ED Notes (Signed)
Was told that Dr. Ignacia Palma is the ED doc. I am having the admitting doc paged.

## 2011-03-20 NOTE — ED Notes (Signed)
BP has dropped. HR is now 60's-80's still in afib per the cardiac monitor. Dr. Ignacia Palma is paged.

## 2011-03-20 NOTE — H&P (Addendum)
[name] MRN: [id number] DOB/AGE: August 29, 1945 66 y.o. Primary Care Physician:DEFAULT,PROVIDER, MD Admit date: 03/20/2011  Primary care provider is unassigned  Chief Complaint: Atrial fibrillation with rapid ventricular response   HPI:    Patient is a 66 year old white male who is homeless with past oral history alcohol abuse, hepatitis B and atrial fibrillation who presented with atrial fibrillation with rapid ventricular response. He was recently admitted and discharged between the sixth and ninth of January 2013, to shelter and is awaiting placement at [facility] . He has ran out of his indigent account, and did not fill his prescription for Cardizem. He presented to the ER with a heart rate in the 120s. He has been going back and forth between the shelter and [facility] [place]. He was returned to the ER as he was without his medications.   During her last admission, cardiac Enzymes x3 negative . He was initially started on a Cardizem drip and his rate was controlled. He was evaluated by cardiology who increased his Cardizem from 120 mg by mouth daily to 240 mg by mouth daily. Patient active he started having some episodes of bradycardia and he was changed to 180 mg by mouth daily. He was last seen by Dr. [name] on 03/19/2011.   today he denies any chest pain or any shortness of breath, he was initiated on a Cardizem drip after receiving her 20 mg IV bolus. His heart rate is currently in the 90s and is hemodynamically stable full   Past Medical History  Diagnosis Date  . Atrial fibrillation   . Angina   . Shortness of breath   . Hepatitis     hepatitis B  . Palpitations   . Hypertension     Past Surgical History  Procedure Date  . Mouth surgery     Prior to Admission medications   Medication Sig Start Date End Date Taking? Authorizing Provider  aspirin EC 81 MG tablet Take 81 mg by mouth daily.    Yes Historical Provider, MD  diltiazem (CARDIZEM CD)  180 MG 24 hr capsule Take 1 capsule (180 mg total) by mouth daily. 03/19/11 03/18/12 Yes Hollice Espy, MD    Allergies: No Known Allergies  History reviewed. No pertinent family history.  Social History:  reports that he has been smoking Cigarettes.  He has been smoking about 1 pack per day. He does not have any smokeless tobacco history on file. He reports that he drinks about 10.8 ounces of alcohol per week. He reports that he does not use illicit drugs.         ROS complete 14 point review of systems was done as documented in history of present illness PHYSICAL EXAM: Blood pressure 107/81, pulse 43, temperature 97.4 F (36.3 C), temperature source Oral, resp. rate 19, SpO2 95.00%. Constitutional: He is oriented to person, place, and time.  HENT:  Head: Normocephalic and atraumatic.  Right Ear: External ear normal.  Left Ear: External ear normal.  Mouth/Throat: Oropharynx is clear and moist.  Eyes: Conjunctivae and EOM are normal. Pupils are equal, round, and reactive to light.  Neck: Normal range of motion. Neck supple.  Cardiovascular: Normal rate, regular rhythm and normal heart sounds.  Pulmonary/Chest: Effort normal and breath sounds normal.  Abdominal: Bowel sounds are normal.  Musculoskeletal: Normal range of motion.  Neurological: He is alert and oriented to person, place, and time.  No sensory of motor deficit.  Skin: Skin is warm and dry.  Psychiatric: He has a normal mood and affect. His behavior is normal.    No results found for this or any previous visit (from the past 240 hour(s)).   Results for orders placed during the hospital encounter of 03/20/11 (from the past 48 hour(s))  CBC     Status: Normal   Collection Time   03/20/11 12:16 PM      Component Value Range Comment   WBC 7.3  4.0 - 10.5 (K/uL)    RBC 5.22  4.22 - 5.81 (MIL/uL)    Hemoglobin 16.8  13.0 - 17.0 (g/dL)    HCT 40.9  81.1 - 91.4 (%)    MCV 91.6  78.0 - 100.0 (fL)    MCH 32.2  26.0 -  34.0 (pg)    MCHC 35.1  30.0 - 36.0 (g/dL)    RDW 78.2  95.6 - 21.3 (%)    Platelets 166  150 - 400 (K/uL)   DIFFERENTIAL     Status: Normal   Collection Time   03/20/11 12:16 PM      Component Value Range Comment   Neutrophils Relative 57  43 - 77 (%)    Neutro Abs 4.2  1.7 - 7.7 (K/uL)    Lymphocytes Relative 33  12 - 46 (%)    Lymphs Abs 2.4  0.7 - 4.0 (K/uL)    Monocytes Relative 9  3 - 12 (%)    Monocytes Absolute 0.7  0.1 - 1.0 (K/uL)    Eosinophils Relative 1  0 - 5 (%)    Eosinophils Absolute 0.1  0.0 - 0.7 (K/uL)    Basophils Relative 0  0 - 1 (%)    Basophils Absolute 0.0  0.0 - 0.1 (K/uL)   BASIC METABOLIC PANEL     Status: Abnormal   Collection Time   03/20/11 12:16 PM      Component Value Range Comment   Sodium 136  135 - 145 (mEq/L)    Potassium 4.7  3.5 - 5.1 (mEq/L)    Chloride 102  96 - 112 (mEq/L)    CO2 24  19 - 32 (mEq/L)    Glucose, Bld 86  70 - 99 (mg/dL)    BUN 16  6 - 23 (mg/dL)    Creatinine, Ser 0.86  0.50 - 1.35 (mg/dL)    Calcium 9.6  8.4 - 10.5 (mg/dL)    GFR calc non Af Amer 87 (*) >90 (mL/min)    GFR calc Af Amer >90  >90 (mL/min)   ETHANOL     Status: Normal   Collection Time   03/20/11 12:16 PM      Component Value Range Comment   Alcohol, Ethyl (B) <11  0 - 11 (mg/dL)   URINALYSIS, ROUTINE W REFLEX MICROSCOPIC     Status: Normal   Collection Time   03/20/11 12:31 PM      Component Value Range Comment   Color, Urine YELLOW  YELLOW     APPearance CLEAR  CLEAR     Specific Gravity, Urine 1.016  1.005 - 1.030     pH 7.0  5.0 - 8.0     Glucose, UA NEGATIVE  NEGATIVE (mg/dL)    Hgb urine dipstick NEGATIVE  NEGATIVE     Bilirubin Urine NEGATIVE  NEGATIVE     Ketones, ur NEGATIVE  NEGATIVE (mg/dL)    Protein, ur NEGATIVE  NEGATIVE (mg/dL)    Urobilinogen, UA 1.0  0.0 - 1.0 (mg/dL)    Nitrite NEGATIVE  NEGATIVE     Leukocytes, UA NEGATIVE  NEGATIVE  MICROSCOPIC NOT DONE ON URINES WITH NEGATIVE PROTEIN, BLOOD, LEUKOCYTES, NITRITE, OR GLUCOSE <1000  mg/dL.  URINE RAPID DRUG SCREEN (HOSP PERFORMED)     Status: Normal   Collection Time   03/20/11 12:31 PM      Component Value Range Comment   Opiates NONE DETECTED  NONE DETECTED     Cocaine NONE DETECTED  NONE DETECTED     Benzodiazepines NONE DETECTED  NONE DETECTED     Amphetamines NONE DETECTED  NONE DETECTED     Tetrahydrocannabinol NONE DETECTED  NONE DETECTED     Barbiturates NONE DETECTED  NONE DETECTED      Dg Chest 2 View  03/20/2011  *RADIOLOGY REPORT*  Clinical Data: Rapid atrial fibrillation, cough, chest pain  CHEST - 2 VIEW  Comparison: 03/06/2011  Findings: Upper-normal size of cardiac silhouette. Atherosclerotic calcification within minimally tortuous thoracic aorta. Mediastinal contours and pulmonary vascularity otherwise normal. Emphysematous and minimal chronic bronchitic changes. Minimal linear scarring versus subsegmental atelectasis right lower lobe. No pulmonary infiltrate, pleural effusion, or pneumothorax. Diffuse osseous demineralization.  IMPRESSION: Emphysematous and minimal bronchitic changes. No acute abnormalities.  Original Report Authenticated By: Lollie Marrow, M.D.   Dg Chest Portable 1 View  03/06/2011  *RADIOLOGY REPORT*  Clinical Data: Left lower chest pain for 5-6 days; shortness of breath and chronic cough.  Congestion and wheezing.  History of smoking.  PORTABLE CHEST - 1 VIEW  Comparison: Chest radiograph performed 02/11/2011  Findings: The lungs are well-aerated.  Chronic interstitial prominence is again noted.  There is no evidence of focal opacification, pleural effusion or pneumothorax.  The costophrenic angles are incompletely imaged on this study.  The heart is normal in size; the mediastinal contour is within normal limits.  No acute osseous abnormalities are seen.  IMPRESSION: No acute cardiopulmonary process seen; mild chronic lung changes again noted.  Original Report Authenticated By: Tonia Ghent, M.D.    Impression:  Atrial Fibrillation    - currently rate controlled on cardizem drip , - cardiology Dr. Milas Kocher was called, this time he recommends continuation of the diltiazem drip and starting the patient on 180 mg of Cardizem - We will titrate the dose, based on his rate He was noted to have bradycardia with heart rate in the 40s, on a dose of 240 mg of Cardizem CD Cardiology has not been formally consulted, and they should be called if the patient develops bradycardia     HEPATITIS B stable  ALCOHOL ABUSE stable   Social work consultation will be obtained because of placement issues          [facility] 03/20/2011, 5:44 PM

## 2011-03-20 NOTE — ED Notes (Signed)
Ordered heart healthy diet.  

## 2011-03-20 NOTE — Plan of Care (Signed)
Problem: Discharge Progression Outcomes Goal: Sinus rate/atrial ECG rhythm with HR < 100/min Outcome: Completed/Met Date Met:  03/20/11 AFIB

## 2011-03-20 NOTE — ED Notes (Signed)
2039-01 READY

## 2011-03-20 NOTE — ED Notes (Signed)
Reg dinner req

## 2011-03-20 NOTE — ED Provider Notes (Addendum)
History     CSN: [id number]  Arrival date & time 03/20/11  1114   First MD Initiated Contact with Patient 03/20/11 1129      Chief Complaint  Patient presents with  . Shortness of Breath    (Consider location/radiation/quality/duration/timing/severity/associated sxs/prior treatment) HPI Comments: Patient is a 66 year old man who was recently hospitalized for atrial fibrillation and alcohol abuse. He was discharged on medications for his atrial fibrillation, and was supposed to have gone to [facility] for rehabilitation.  He was taken air, was referred to a shelter, which had no beds, was referred back to day Loraine Leriche, who referred him back to the [facility] [place] because the patient thought he had atrial fibrillation again. The patient expressed the wish to go to [facility].  Patient is a 66 y.o. male presenting with general illness. The history is provided by the patient and medical records. No language interpreter was used.  Illness  The current episode started today. Episode frequency: He had been discharged to [facility] this morning after hospitalization for rapid atrial fibrillation and alcohol abuse. The problem is moderate. The symptoms are relieved by nothing. The symptoms are aggravated by nothing. Pertinent negatives include no fever.    Past Medical History  Diagnosis Date  . Atrial fibrillation   . Angina   . Shortness of breath   . Hepatitis     hepatitis B  . Palpitations   . Hypertension     Past Surgical History  Procedure Date  . Mouth surgery     History reviewed. No pertinent family history.  History  Substance Use Topics  . Smoking status: Current Everyday Smoker -- 1.0 packs/day    Types: Cigarettes  . Smokeless tobacco: Not on file  . Alcohol Use: 10.8 oz/week    18 Cans of beer per week     heavy drinker      Review of Systems  Constitutional: Negative.  Negative for fever.  HENT: Negative.   Eyes: Negative.     Respiratory: Negative.   Cardiovascular: Positive for palpitations.  Gastrointestinal: Negative.   Genitourinary: Negative.   Musculoskeletal: Negative.   Neurological: Negative.   Psychiatric/Behavioral: Positive for dysphoric mood. The patient is nervous/anxious.     Allergies  Review of patient's allergies indicates no known allergies.  Home Medications   Current Outpatient Rx  Name Route Sig Dispense Refill  . ASPIRIN EC 81 MG PO TBEC Oral Take 81 mg by mouth daily.     Marland Kitchen DILTIAZEM HCL ER COATED BEADS 180 MG PO CP24 Oral Take 1 capsule (180 mg total) by mouth daily. 30 capsule 0    BP 114/70  Pulse 78  Temp(Src) 97.4 F (36.3 C) (Oral)  SpO2 99%  Physical Exam  Constitutional: He is oriented to person, place, and time.  HENT:  Head: Normocephalic and atraumatic.  Right Ear: External ear normal.  Left Ear: External ear normal.  Mouth/Throat: Oropharynx is clear and moist.  Eyes: Conjunctivae and EOM are normal. Pupils are equal, round, and reactive to light.  Neck: Normal range of motion. Neck supple.  Cardiovascular: Normal rate, regular rhythm and normal heart sounds.   Pulmonary/Chest: Effort normal and breath sounds normal.  Abdominal: Bowel sounds are normal.  Musculoskeletal: Normal range of motion.  Neurological: He is alert and oriented to person, place, and time.       No sensory of motor deficit.    Skin: Skin is warm and dry.  Psychiatric: He has  a normal mood and affect. His behavior is normal.    ED Course  Procedures (including critical care time)   Pt seen --> had physical examination.  Old charts reviewed.  Lab workup ordered.  Called Darl Pikes, pt's inpatient social worker, who was aware of his situation.  Will do lab workup to check on possibility of atrial fibrillation.    12:18 PM  Date: 03/20/2011  Rate:113  Rhythm: atrial fibrillation  QRS Axis: normal  Intervals: normal  ST/T Wave abnormalities: normal  Conduction Disutrbances:none   Narrative Interpretation: Abnormal EKG:  Atrial fibrillation with rapid response.  Old EKG Reviewed: unchanged   1:13 PM EKG showed atrial fibrillation with rapid response.  Rx with IV cardizem bolus and continuous infusion.  2:47 PM Rate of AF is in 90's now.  Lab workup negative.  Call to [facility] to readmit pt.     1. Atrial fibrillation with rapid ventricular response   2. Alcohol abuse      Admit to a telemetry bed to a telemetry bed to [employer] Team 2, Dr. Mahala Menghini.    Carleene Cooper III, MD 03/20/11 1641  Carleene Cooper III, MD 03/20/11 [phone]

## 2011-03-20 NOTE — ED Notes (Signed)
To ed for eval sob. States he was just released from hospital this am. Pt was in hospital for etoh dextox and 'high heart rate". Pt states he was sent from here this am to [facility] rehab who then sent him to a shelter that had not open beds and pt was returned to [facility]. Sent back here from [facility]. Skin w/d, resp e/u. Appears in nad. Last eoth was a week ago

## 2011-03-20 NOTE — Progress Notes (Signed)
Pt d/c instructions reviewed with him. Pt verbalized understanding, refused to sign instructions. Pt given copy of instuctions.  SW had arranged for cab to pick up pt. Pt d/c'd via wheelchair with belongings, escorted by unit NT. Cab voucher given to NT to give cab driver, pt going to treatment facility. Pt instructed his script had been called into a [facility] pharmacy by the MD.

## 2011-03-20 NOTE — ED Notes (Signed)
Patient transported to X-ray 

## 2011-03-21 DIAGNOSIS — I517 Cardiomegaly: Secondary | ICD-10-CM

## 2011-03-21 LAB — PRO B NATRIURETIC PEPTIDE: Pro B Natriuretic peptide (BNP): 528.2 pg/mL — ABNORMAL HIGH (ref 0–125)

## 2011-03-21 LAB — COMPREHENSIVE METABOLIC PANEL
Alkaline Phosphatase: 90 U/L (ref 39–117)
BUN: 22 mg/dL (ref 6–23)
GFR calc Af Amer: 86 mL/min — ABNORMAL LOW (ref >90)
GFR calc non Af Amer: 74 mL/min — ABNORMAL LOW (ref >90)
Glucose, Bld: 94 mg/dL (ref 70–99)
Potassium: 4.5 mEq/L (ref 3.5–5.1)
Total Bilirubin: 0.4 mg/dL (ref 0.3–1.2)
Total Protein: 6.2 g/dL (ref 6.0–8.3)

## 2011-03-21 LAB — CBC
HCT: 44.8 % (ref 39.0–52.0)
Platelets: 148 10*3/uL — ABNORMAL LOW (ref 150–400)
RDW: 14.2 % (ref 11.5–15.5)
WBC: 10.5 10*3/uL (ref 4.0–10.5)

## 2011-03-21 LAB — CARDIAC PANEL(CRET KIN+CKTOT+MB+TROPI)
CK, MB: 1.1 ng/mL (ref 0.3–4.0)
Total CK: 31 U/L (ref 7–232)
Troponin I: <0.3 ng/mL (ref <0.30)

## 2011-03-21 MED ORDER — NICOTINE 21 MG/24HR TD PT24
21.0000 mg | MEDICATED_PATCH | Freq: Every day | TRANSDERMAL | Status: DC
  Administered 2011-03-21 – 2011-03-26 (×6): 21 mg via TRANSDERMAL
  Filled 2011-03-21 (×7): qty 1

## 2011-03-21 MED ORDER — DILTIAZEM HCL ER COATED BEADS 180 MG PO CP24
180.0000 mg | ORAL_CAPSULE | Freq: Every day | ORAL | Status: DC
  Administered 2011-03-22: 180 mg via ORAL
  Filled 2011-03-21: qty 1

## 2011-03-21 NOTE — Progress Notes (Signed)
  Echocardiogram 2D Echocardiogram has been performed.  [name] 03/21/2011, 11:24 AM

## 2011-03-21 NOTE — Progress Notes (Signed)
Subjective: Patient seen and examined this afternoon. informrs feeling fatigued. Off cardizem drip and has been rate controlled on tele  Objective:  Vital signs in last 24 hours:  Filed Vitals:   03/20/11 2152 03/21/11 0405 03/21/11 0529 03/21/11 1330  BP: 96/72 102/69 98/66 104/71  Pulse: 72 75  87  Temp: 97.2 F (36.2 C) 96.8 F (36 C)  97.4 F (36.3 C)  TempSrc: Oral Oral  Oral  Resp: 18 19  20   Height: 6' (1.829 m)     Weight: 90.75 kg (200 lb 1.1 oz) 92 kg (202 lb 13.2 oz)    SpO2: 95% 94%  95%    Intake/Output from previous day:   Intake/Output Summary (Last 24 hours) at 03/21/11 1550 Last data filed at 03/21/11 1300  Gross per 24 hour  Intake    600 ml  Output    600 ml  Net      0 ml    Physical Exam:  General: elderly male in no acute distress. HEENT: no pallor, no icterus, moist oral mucosa, no JVD, no lymphadenopathy Heart: s1&s2 Irregularly irregular, without murmurs, rubs, gallops. Lungs: Clear to auscultation bilaterally. Abdomen: Soft, nontender, nondistended, positive bowel sounds. Extremities: No clubbing cyanosis or edema with positive pedal pulses. Neuro: Alert, awake, oriented x3, nonfocal.   Lab Results:  Basic Metabolic Panel:    Component Value Date/Time   NA 135 03/21/2011 0205   K 4.5 03/21/2011 0205   CL 102 03/21/2011 0205   CO2 25 03/21/2011 0205   BUN 22 03/21/2011 0205   CREATININE 1.04 03/21/2011 0205   GLUCOSE 94 03/21/2011 0205   CALCIUM 9.3 03/21/2011 0205   CBC:    Component Value Date/Time   WBC 10.5 03/21/2011 0205   HGB 15.5 03/21/2011 0205   HCT 44.8 03/21/2011 0205   PLT 148* 03/21/2011 0205   MCV 92.2 03/21/2011 0205   NEUTROABS 4.2 03/20/2011 1216   LYMPHSABS 2.4 03/20/2011 1216   MONOABS 0.7 03/20/2011 1216   EOSABS 0.1 03/20/2011 1216   BASOSABS 0.0 03/20/2011 1216    No results found for this or any previous visit (from the past 240 hour(s)).  Studies/Results: Dg Chest 2 View  03/20/2011  *RADIOLOGY REPORT*  Clinical  Data: Rapid atrial fibrillation, cough, chest pain  CHEST - 2 VIEW  Comparison: 03/06/2011  Findings: Upper-normal size of cardiac silhouette. Atherosclerotic calcification within minimally tortuous thoracic aorta. Mediastinal contours and pulmonary vascularity otherwise normal. Emphysematous and minimal chronic bronchitic changes. Minimal linear scarring versus subsegmental atelectasis right lower lobe. No pulmonary infiltrate, pleural effusion, or pneumothorax. Diffuse osseous demineralization.  IMPRESSION: Emphysematous and minimal bronchitic changes. No acute abnormalities.  Original Report Authenticated By: Lollie Marrow, M.D.    Medications: Scheduled Meds:   . aspirin EC  81 mg Oral Daily  . aspirin EC  81 mg Oral To Minor  . diltiazem  60 mg Oral To Minor  . diltiazem  60 mg Oral Q8H  . enoxaparin  40 mg Subcutaneous Q24H  . LORazepam  0.5 mg Oral Once  . nicotine  21 mg Transdermal Daily  . zolpidem  5 mg Oral Once  . DISCONTD: diltiazem  180 mg Oral Daily  . DISCONTD: diltiazem  180 mg Oral Daily  . DISCONTD: diltiazem  60 mg Oral Q8H  . DISCONTD: LORazepam  0.5 mg Intravenous Q4H   Continuous Infusions:   . diltiazem (CARDIZEM) infusion Stopped (03/20/11 1938)  . DISCONTD: diltiazem (CARDIZEM) infusion 10 mg/hr (03/20/11 1352)  PRN Meds:.acetaminophen, acetaminophen, levalbuterol, LORazepam, ondansetron (ZOFRAN) IV, ondansetron  Assessment 66 y/o homeless male with hx of Etoh  abuse, hep B and A fib with recent hospitalization for afib and discharged to shelter, returns back with Afib with RVR.   Plan: Afib with RVR Patient placed on cardizem drip and is now off it  he is now rate controlled on po cardizem q 8 hr  hospitalist discussed with cardiology and recommended to start on cardizem 180 mg daily once HR controlled. Patient was noted to be brady to 40s. Will switch to long acting cardizem in the morning Patient informs he did not have money to fill the  prescriptions. Need to discuss with SW in am. Also patient does not have a shelter to go to anymore.    Hepatitis B Stable   etoh abuse  stable  Diet:   stable    Active Problems:  HEPATITIS B  ALCOHOL ABUSE  Atrial fibrillation  Atrial fibrillation with RVR    LOS: 1 day   Dashanna Kinnamon 03/21/2011, 3:50 PM

## 2011-03-21 NOTE — Progress Notes (Signed)
Patient transferred to this CSW today. Please see previous notes from CSW which indicate patient has been approved for admission to [facility] on January 17th.  CSW will be available if further CSW needs arise- please advise. Reece Levy, MSW, Theresia Majors [phone]

## 2011-03-22 DIAGNOSIS — I4891 Unspecified atrial fibrillation: Principal | ICD-10-CM

## 2011-03-22 MED ORDER — ZOLPIDEM TARTRATE 5 MG PO TABS
5.0000 mg | ORAL_TABLET | Freq: Every day | ORAL | Status: DC
  Administered 2011-03-22 – 2011-03-26 (×5): 5 mg via ORAL
  Filled 2011-03-22 (×5): qty 1

## 2011-03-22 MED ORDER — LISINOPRIL 5 MG PO TABS
5.0000 mg | ORAL_TABLET | Freq: Every day | ORAL | Status: DC
  Administered 2011-03-22: 5 mg via ORAL
  Filled 2011-03-22 (×2): qty 1

## 2011-03-22 MED ORDER — CARVEDILOL 3.125 MG PO TABS
3.1250 mg | ORAL_TABLET | Freq: Two times a day (BID) | ORAL | Status: DC
  Administered 2011-03-23: 3.125 mg via ORAL
  Filled 2011-03-22 (×4): qty 1

## 2011-03-22 MED ORDER — DILTIAZEM HCL ER COATED BEADS 240 MG PO CP24
240.0000 mg | ORAL_CAPSULE | Freq: Every day | ORAL | Status: DC
  Filled 2011-03-22: qty 1

## 2011-03-22 NOTE — Progress Notes (Signed)
Nurse checked patient's BP before Cardizem administration 111/80. Nurse rechecked BP manually and got a reading of 115/70. Nurse decided to administer the Cardizem 0530 due to tolerance of the 1ST dose (BP was 115/78 @ 2200).   Nurse paged NP Claiborne Billings and informed him of the duplicate Cardizem order, because Pt was due to receive another administration of Cardizem 180 mg @ 1000. Claiborne Billings instructed the nurse to write a sticky note to the doctor informing him of the duplicate order so that he/she can decide which one they want the patient to receive. Nurse wrote a sticky note.Harmon Pier

## 2011-03-22 NOTE — Progress Notes (Signed)
Utilization review complete 

## 2011-03-22 NOTE — Progress Notes (Signed)
[name]    Interval h/o:-  66 yo cm , knonw polysubstance abuse, chr hepatitis b, A.fib-s/p cardioversion in [facility] 2 mo ago re-presents with Atrial fibrillation after just being discharged   Subjective: Tired, not able to get any sleep.  Feels nervous and depressed.  Normally sees the social worker here when he gets admitted for Afib.  Uses ETOH last [facility] Wednesday and came back in in a.fib.  Has some mild CP's-felt like "gas pains".  States when he gets up and moves around his HR was elevated   Objective: Vital signs in last 24 hours: Temp:  [97 F (36.1 C)-97.4 F (36.3 C)] 97.4 F (36.3 C) (01/11 0514) Pulse Rate:  [77-87] 77  (01/11 0514) Resp:  [20] 20  (01/11 0514) BP: (104-115)/(70-80) 115/70 mmHg (01/11 0514) SpO2:  [94 %-95 %] 94 % (01/11 0514) Weight:  [90.9 kg (200 lb 6.4 oz)] 90.9 kg (200 lb 6.4 oz) (01/11 0514) Weight change: -0.6 kg (-1 lb 5.2 oz)  Intake/Output Summary (Last 24 hours) at 03/22/11 1319 Last data filed at 03/22/11 0900  Gross per 24 hour  Intake   1320 ml  Output      0 ml  Net   1320 ml    BP 108/78  Pulse 75  Temp(Src) 97 F (36.1 C) (Oral)  Resp 20  Ht 6' (1.829 m)  Wt 90.9 kg (200 lb 6.4 oz)  BMI 27.18 kg/m2  SpO2 97% General appearance: alert, cooperative and flat affect Throat: lips, mucosa, and tongue normal; teeth and gums normal Lungs: clear to auscultation bilaterally Heart: regular rate and rhythm, S1, S2 normal, no murmur, click, rub or gallop Abdomen: soft, non-tender; bowel sounds normal; no masses,  no organomegaly Pulses: 2+ and symmetric Neurologic: Grossly normal  Lab Results:  Basename 03/21/11 0205 03/20/11 1216  NA 135 136  K 4.5 4.7  CL 102 102  CO2 25 24  GLUCOSE 94 86  BUN 22 16  CREATININE 1.04 0.93  CALCIUM 9.3 9.6  MG -- --  PHOS -- --    Basename 03/21/11 0205 03/20/11 2140  AST 17 20  ALT 15 15  ALKPHOS 90 88  BILITOT 0.4 0.3    PROT 6.2 6.2  ALBUMIN 3.4* 3.5   No results found for this basename: LIPASE:2,AMYLASE:2 in the last 72 hours  Basename 03/21/11 0205 03/20/11 1216  WBC 10.5 7.3  NEUTROABS -- 4.2  HGB 15.5 16.8  HCT 44.8 47.8  MCV 92.2 91.6  PLT 148* 166    Basename 03/21/11 0934 03/21/11 0205 03/20/11 1742  CKTOTAL 31 32 49  CKMB 1.0 1.1 1.0  CKMBINDEX -- -- --  TROPONINI <0.30 <0.30 <0.30   No components found with this basename: POCBNP:3 No results found for this basename: DDIMER:2 in the last 72 hours No results found for this basename: HGBA1C:2 in the last 72 hours No results found for this basename: CHOL:2,HDL:2,LDLCALC:2,TRIG:2,CHOLHDL:2,LDLDIRECT:2 in the last 72 hours No results found for this basename: TSH,T4TOTAL,FREET3,T3FREE,THYROIDAB in the last 72 hours No results found for this basename: VITAMINB12:2,FOLATE:2,FERRITIN:2,TIBC:2,IRON:2,RETICCTPCT:2 in the last 72 hours  Micro Results:   Medications: I have reviewed the patient's current medications. Scheduled Meds:   . aspirin EC  81 mg Oral Daily  . aspirin EC  81 mg Oral To Minor  . diltiazem  180 mg Oral Daily  . diltiazem  60 mg Oral To Minor  . diltiazem  60 mg Oral Q8H  . enoxaparin  40 mg Subcutaneous Q24H  . nicotine  21 mg Transdermal Daily   Continuous Infusions:   . DISCONTD: diltiazem (CARDIZEM) infusion Stopped (03/20/11 1938)   PRN Meds:.acetaminophen, acetaminophen, levalbuterol, LORazepam, ondansetron (ZOFRAN) IV, ondansetron  Assessment/Plan: Patient Active Problem List  Diagnoses  . Atrial fibrillation with RVR-Will increase Cardizem to 240 Cd daily as rate not well controlled when moving. ECHO back showing Ef 25-30% Not a coumadin candidate given propensity for ETOH, klus risk of fall and traumatic bleeding    . ALCOHOL ABUSE+TOBACCO ABUSE-was at [facility] exhausted most options.  Has a place to go 1.17.12  . DEPRESSION-Continue Ativan-change to 1 mg qid as anxious  Will reassess  . CHF-EF  25-30%-Will ask cardiology to re-evaluate-unclear if needs a cath.  Marland Kitchen HEMORRHOIDS-symptomatic rx  . CHEST PAIN-fleeting and not likely cardiac  . HEPATITIS B-unclear if ever Rx-will get LFt's and reassess     LOS: 2 days   Cleota Pellerito,JAI 03/22/2011, 1:19 PM

## 2011-03-22 NOTE — Progress Notes (Signed)
Patient articulated to the nurse that he wanted his ativan @ 2000. Nurse retrieves Ativan from the pyxis. Nurse administered the Ativan via IV @ 2039, along with NS running @ KVO rate. @ 2059 patient informed the nurse that his IV was leaking fluid. Nurse checked his IV and found that it was still patent. Nurse educated the patient that because of the consistency of the Ativan sometimes this happens. @ 2120 nurse follows up with the patient to make sure that there were no issues with the IV. Patient inform the nurse that the IV was fine, however, he dose not believe he received any of the Ativan, that he believes it leaked out when he reported it leaking. Nurse informs the patient that he would check back with him in an hour, and if he feels that the Ativan isn't working we will see if/when he can have it again. Nurse follows up again with the patient @ 2300, and patient informs the nurse that the medication is working. Nurse informed the patient of the next time he can receive Ativan. Nurse administered a second dose of PRN 0.5mg  =0.21ml of Ativan @ 0051. Patient went to sleep.Harmon Pier

## 2011-03-22 NOTE — Consult Note (Signed)
Reason for Consult:Atrial fib Referring Physician: Dr. Phineas Inches is an 66 y.o. male.  HPI: This 66 year old male is readmitted with recurrent rapid atrial fib in the setting of chronic alcohol abuse.  He is awaiting admission to an alcohol rehab center.  States he has had a problem with alcohol for 40 years. States he has had recurrent atrial fib for at least 3 years. States prior control with digoxin, cardizem 360 mg and ASA.  Underwent TEE cardioversion in [place] 2 months ago but did not hold.  When he is drinking,money is often short and he often fails to have his medicines for 3 or 4 days at a time. No symptoms of CHF.  No history of diabetes, hypertension or stroke/TIA  Past Medical History  Diagnosis Date  . Atrial fibrillation   . Angina   . Shortness of breath   . Hepatitis     hepatitis B  . Palpitations   . Hypertension     Past Surgical History  Procedure Date  . Mouth surgery     History reviewed. No pertinent family history.  Social History:  reports that he has been smoking Cigarettes.  He has been smoking about 1 pack per day. He does not have any smokeless tobacco history on file. He reports that he drinks about 10.8 ounces of alcohol per week. He reports that he does not use illicit drugs. He is divorced. Two grown children live in the area.  Formerly worked in Hydrologist jobs.  Allergies: No Known Allergies  Medications: I have reviewed the patient's current medications.  Results for orders placed during the hospital encounter of 03/20/11 (from the past 48 hour(s))  CARDIAC PANEL(CRET KIN+CKTOT+MB+TROPI)     Status: Normal   Collection Time   03/20/11  5:42 PM      Component Value Range Comment   Total CK 49  7 - 232 (U/L)    CK, MB 1.0  0.3 - 4.0 (ng/mL)    Troponin I <0.30  <0.30 (ng/mL)    Relative Index RELATIVE INDEX IS INVALID  0.0 - 2.5    HEPATIC FUNCTION PANEL     Status: Normal   Collection Time   03/20/11  9:40 PM      Component Value Range Comment   Total Protein 6.2  6.0 - 8.3 (g/dL)    Albumin 3.5  3.5 - 5.2 (g/dL)    AST 20  0 - 37 (U/L)    ALT 15  0 - 53 (U/L)    Alkaline Phosphatase 88  39 - 117 (U/L)    Total Bilirubin 0.3  0.3 - 1.2 (mg/dL)    Bilirubin, Direct <0.4  0.0 - 0.3 (mg/dL)    Indirect Bilirubin NOT CALCULATED  0.3 - 0.9 (mg/dL)   PRO B NATRIURETIC PEPTIDE     Status: Abnormal   Collection Time   03/21/11  2:04 AM      Component Value Range Comment   Pro B Natriuretic peptide (BNP) 528.2 (*) 0 - 125 (pg/mL)   CARDIAC PANEL(CRET KIN+CKTOT+MB+TROPI)     Status: Normal   Collection Time   03/21/11  2:05 AM      Component Value Range Comment   Total CK 32  7 - 232 (U/L)    CK, MB 1.1  0.3 - 4.0 (ng/mL)    Troponin I <0.30  <0.30 (ng/mL)    Relative Index RELATIVE INDEX IS INVALID  0.0 - 2.5  COMPREHENSIVE METABOLIC PANEL     Status: Abnormal   Collection Time   03/21/11  2:05 AM      Component Value Range Comment   Sodium 135  135 - 145 (mEq/L)    Potassium 4.5  3.5 - 5.1 (mEq/L)    Chloride 102  96 - 112 (mEq/L)    CO2 25  19 - 32 (mEq/L)    Glucose, Bld 94  70 - 99 (mg/dL)    BUN 22  6 - 23 (mg/dL)    Creatinine, Ser 1.61  0.50 - 1.35 (mg/dL)    Calcium 9.3  8.4 - 10.5 (mg/dL)    Total Protein 6.2  6.0 - 8.3 (g/dL)    Albumin 3.4 (*) 3.5 - 5.2 (g/dL)    AST 17  0 - 37 (U/L)    ALT 15  0 - 53 (U/L)    Alkaline Phosphatase 90  39 - 117 (U/L)    Total Bilirubin 0.4  0.3 - 1.2 (mg/dL)    GFR calc non Af Amer 74 (*) >90 (mL/min)    GFR calc Af Amer 86 (*) >90 (mL/min)   CBC     Status: Abnormal   Collection Time   03/21/11  2:05 AM      Component Value Range Comment   WBC 10.5  4.0 - 10.5 (K/uL)    RBC 4.86  4.22 - 5.81 (MIL/uL)    Hemoglobin 15.5  13.0 - 17.0 (g/dL)    HCT 09.6  04.5 - 40.9 (%)    MCV 92.2  78.0 - 100.0 (fL)    MCH 31.9  26.0 - 34.0 (pg)    MCHC 34.6  30.0 - 36.0 (g/dL)    RDW 81.1  91.4 - 78.2 (%)    Platelets 148 (*) 150 - 400  (K/uL)   CARDIAC PANEL(CRET KIN+CKTOT+MB+TROPI)     Status: Normal   Collection Time   03/21/11  9:34 AM      Component Value Range Comment   Total CK 31  7 - 232 (U/L)    CK, MB 1.0  0.3 - 4.0 (ng/mL)    Troponin I <0.30  <0.30 (ng/mL)    Relative Index RELATIVE INDEX IS INVALID  0.0 - 2.5      No results found.  ROS:  Negative except in PI.  His positive hepatitis B was discovered when he applied to enter a Quit Smoking clinical trial.  No hepatic symptoms. Blood pressure 108/78, pulse 75, temperature 97 F (36.1 C), temperature source Oral, resp. rate 20, height 6' (1.829 m), weight 200 lb 6.4 oz (90.9 kg), SpO2 97.00%. The patient appears to be in no distress.  Head and neck exam reveals that the pupils are equal and reactive.  The extraocular movements are full.  There is no scleral icterus.  Mouth and pharynx are benign.  No lymphadenopathy.  No carotid bruits.  The jugular venous pressure is normal.  Thyroid is not enlarged or tender.  Chest is clear to percussion and auscultation.  No rales or rhonchi.  Expansion of the chest is symmetrical.  Heart reveals no abnormal lift or heave.  First and second heart sounds are normal.  There is no murmur gallop rub or click. Pulse irregular.  The abdomen is soft and nontender.  Bowel sounds are normoactive.  There is no hepatosplenomegaly or mass.  There are no abdominal bruits.  Extremities reveal no phlebitis or edema.  Pedal pulses are good.  There is no cyanosis  or clubbing.  Neurologic exam is normal strength and no lateralizing weakness.  No sensory deficits.  Integument reveals no rash  Assessment/Plan: 1.  Chronic recurrent atrial fib 2.  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  EF is now 25-30%  with global hypokinesis. Of note he had normal EF of 61% at time of an adenosine myoview 12/08/08.  Recommendations: 1.  ASA for anticoagulant.  Not good candidate for coumadin with his alcohol history and CHADSS score is  zero. 2.  He does not need a cath.  He needs to be off alcohol and allow his myocardium to recover.  Suggest followup echo in 3 months after cessation of alcohol. 3.  Besides cessation of alcohol recommend adding ACEi and carvedilol to improve LV systolic function.  Will have to start slowly because BP is soft.  Will follow in hospital with you.  Cassell Clement 03/22/2011, 5:37 PM

## 2011-03-22 NOTE — Progress Notes (Signed)
CSW spoke to pt at length.  Pt was requesting psych consult again as he feels if he discharges before he is able to be admitted to [facility] on 1/22 that he will "drink himself to death."  CSW confirmed his admission to [facility] is planned for 1/22.  Pt does report he receives income on Wednesday and can go to a motel if needed then, however he is concerned about his mental state while he is awaiting admission to [facility].  CSW will continue to follow to assist with d/c and let MD know pt's request for psych consult.

## 2011-03-23 ENCOUNTER — Other Ambulatory Visit: Payer: Self-pay

## 2011-03-23 LAB — IRON AND TIBC
Iron: 152 ug/dL — ABNORMAL HIGH (ref 42–135)
Saturation Ratios: 57 % — ABNORMAL HIGH (ref 20–55)
TIBC: 267 ug/dL (ref 215–435)

## 2011-03-23 MED ORDER — METOPROLOL SUCCINATE 12.5 MG HALF TABLET
12.5000 mg | ORAL_TABLET | Freq: Every day | ORAL | Status: DC
  Administered 2011-03-24 – 2011-03-25 (×2): 12.5 mg via ORAL
  Filled 2011-03-23 (×3): qty 1

## 2011-03-23 MED ORDER — PSYLLIUM 95 % PO PACK
1.0000 | PACK | Freq: Every day | ORAL | Status: DC
  Filled 2011-03-23 (×5): qty 1

## 2011-03-23 MED ORDER — LISINOPRIL 2.5 MG PO TABS
2.5000 mg | ORAL_TABLET | Freq: Every day | ORAL | Status: DC
  Administered 2011-03-24 – 2011-03-26 (×2): 2.5 mg via ORAL
  Filled 2011-03-23 (×4): qty 1

## 2011-03-23 MED ORDER — DILTIAZEM HCL ER COATED BEADS 120 MG PO CP24
120.0000 mg | ORAL_CAPSULE | Freq: Every day | ORAL | Status: DC
  Administered 2011-03-24 – 2011-03-26 (×2): 120 mg via ORAL
  Filled 2011-03-23 (×3): qty 1

## 2011-03-23 NOTE — Progress Notes (Signed)
[name]    Interval h/o:-  66 yo cm , known polysubstance abuse, chr hepatitis b, A.fib-s/p cardioversion in [facility] 2 mo ago re-presents with Atrial fibrillation after just being discharged   Subjective: Doing fair.  Hypotensive, and slightly dizzy.  No cp however.  Has lost 8 lbs in past week.  Hungry-wants regular diet.  Needs laxative  Treatment Team:  Peter Swaziland, MD Objective: Vital signs in last 24 hours: Temp:  [97.3 F (36.3 C)-97.4 F (36.3 C)] 97.3 F (36.3 C) (01/12 1359) Pulse Rate:  [53-81] 53  (01/12 1405) Resp:  [18-20] 18  (01/12 1405) BP: (70-111)/(40-80) 85/61 mmHg (01/12 1405) SpO2:  [90 %-96 %] 96 % (01/12 1359) Weight:  [89.858 kg (198 lb 1.6 oz)] 89.858 kg (198 lb 1.6 oz) (01/12 0431) Weight change: -1.042 kg (-2 lb 4.8 oz)  Intake/Output Summary (Last 24 hours) at 03/23/11 1417 Last data filed at 03/23/11 1300  Gross per 24 hour  Intake   1540 ml  Output    200 ml  Net   1340 ml    BP 85/61  Pulse 53  Temp(Src) 97.3 F (36.3 C) (Oral)  Resp 18  Ht 6' (1.829 m)  Wt 89.858 kg (198 lb 1.6 oz)  BMI 26.87 kg/m2  SpO2 96% General appearance: alert, cooperative and flat affect Throat: lips, mucosa, and tongue normal; teeth and gums normal Lungs: clear to auscultation bilaterally Heart: regular rate and rhythm, S1, S2 normal, no murmur, click, rub or gallop Abdomen: soft, non-tender; bowel sounds normal; no masses,  no organomegaly Pulses: 2+ and symmetric Neurologic: Grossly normal  Lab Results:  Basename 03/21/11 0205  NA 135  K 4.5  CL 102  CO2 25  GLUCOSE 94  BUN 22  CREATININE 1.04  CALCIUM 9.3  MG --  PHOS --    Basename 03/21/11 0205 03/20/11 2140  AST 17 20  ALT 15 15  ALKPHOS 90 88  BILITOT 0.4 0.3  PROT 6.2 6.2  ALBUMIN 3.4* 3.5   No results found for this basename: LIPASE:2,AMYLASE:2 in the last 72 hours  Basename 03/21/11 0205  WBC 10.5  NEUTROABS --  HGB 15.5  HCT  44.8  MCV 92.2  PLT 148*    Basename 03/21/11 0934 03/21/11 0205 03/20/11 1742  CKTOTAL 31 32 49  CKMB 1.0 1.1 1.0  CKMBINDEX -- -- --  TROPONINI <0.30 <0.30 <0.30   No components found with this basename: POCBNP:3 No results found for this basename: DDIMER:2 in the last 72 hours No results found for this basename: HGBA1C:2 in the last 72 hours No results found for this basename: CHOL:2,HDL:2,LDLCALC:2,TRIG:2,CHOLHDL:2,LDLDIRECT:2 in the last 72 hours No results found for this basename: TSH,T4TOTAL,FREET3,T3FREE,THYROIDAB in the last 72 hours No results found for this basename: VITAMINB12:2,FOLATE:2,FERRITIN:2,TIBC:2,IRON:2,RETICCTPCT:2 in the last 72 hours  Micro Results:   Medications: I have reviewed the patient's current medications. Scheduled Meds:    . aspirin EC  81 mg Oral Daily  . diltiazem  120 mg Oral Daily  . enoxaparin  40 mg Subcutaneous Q24H  . lisinopril  2.5 mg Oral Daily  . metoprolol succinate  12.5 mg Oral Daily  . nicotine  21 mg Transdermal Daily  . psyllium  1 packet Oral Daily  . zolpidem  5 mg Oral QHS  . DISCONTD: carvedilol  3.125 mg Oral BID WC  . DISCONTD: diltiazem  180 mg Oral Daily  . DISCONTD: diltiazem  240 mg Oral Daily  . DISCONTD: diltiazem  60 mg Oral Q8H  . DISCONTD: lisinopril  5 mg Oral Daily   Continuous Infusions:  PRN Meds:.acetaminophen, acetaminophen, levalbuterol, LORazepam, ondansetron (ZOFRAN) IV, ondansetron  Assessment/Plan: Patient Active Problem List  Diagnoses  . Atrial fibrillation with RVR-Meds changed by cards and not in Afib-tele shows pauses. Have changed coreg to toprol xl 12.5 Lisinopril 5--->2.5 Cardizem 120 Cd with hold parameters ECHO  Ef 25-30% Not a coumadin candidate given propensity for ETOH, lus risk of fall and traumatic bleeding   . ALCOHOL ABUSE+TOBACCO ABUSE-was at [facility] exhausted most options.  Has a place to go 1.17.12 Working with Child psychotherapist to get placement-Psych consult pending  re: depression and ETOH use  . DEPRESSION-Continue Ativan-change to 1 mg qid as anxious .  Marland Kitchen CHF-EF 25-30%-app cardiology input.  Marland Kitchen HEMORRHOIDS-symptomatic rx  . CHEST PAIN-fleeting and not likely cardiac  . HEPATITIS B-unclear if ever Rx-LFT's wnl Needs screening US q 62mo-34yr     LOS: 3 days   [name],JAI 03/23/2011, 2:17 PM

## 2011-03-23 NOTE — Progress Notes (Signed)
   Subjective:  Remains in atrial fib.  Rate controlled now.  BP low since addition of carvedilol.  Objective:  Vital Signs in the last 24 hours: Temp:  [97 F (36.1 C)-97.4 F (36.3 C)] 97.4 F (36.3 C) (01/12 0431) Pulse Rate:  [53-79] 79  (01/12 0431) Resp:  [18-20] 19  (01/12 0431) BP: (96-111)/(60-80) 96/60 mmHg (01/12 0500) SpO2:  [90 %-97 %] 90 % (01/12 0431) Weight:  [198 lb 1.6 oz (89.858 kg)] 198 lb 1.6 oz (89.858 kg) (01/12 0431)  Intake/Output from previous day: 01/11 0701 - 01/12 0700 In: 2040 [P.O.:2040] Out: -  Intake/Output from this shift: Total I/O In: 100 [P.O.:100] Out: -      . aspirin EC  81 mg Oral Daily  . carvedilol  3.125 mg Oral BID WC  . diltiazem  240 mg Oral Daily  . enoxaparin  40 mg Subcutaneous Q24H  . lisinopril  5 mg Oral Daily  . nicotine  21 mg Transdermal Daily  . zolpidem  5 mg Oral QHS  . DISCONTD: diltiazem  180 mg Oral Daily  . DISCONTD: diltiazem  60 mg Oral Q8H      Physical Exam: The patient appears to be in no distress.  Head and neck exam reveals that the pupils are equal and reactive.  The extraocular movements are full.  There is no scleral icterus.  Mouth and pharynx are benign.  No lymphadenopathy.  No carotid bruits.  The jugular venous pressure is normal.  Thyroid is not enlarged or tender.  Chest is clear to percussion and auscultation.  No rales or rhonchi.  Expansion of the chest is symmetrical.  Heart reveals no abnormal lift or heave.  First and second heart sounds are normal.  There is no murmur gallop rub or click. Pulse irregular.  The abdomen is soft and nontender.  Bowel sounds are normoactive.  There is no hepatosplenomegaly or mass.  There are no abdominal bruits.  Extremities reveal no phlebitis or edema.  Pedal pulses are good.  There is no cyanosis or clubbing.  Neurologic exam is normal strength and no lateralizing weakness.  No sensory deficits.  Integument reveals no rash  Lab  Results:  Basename 03/21/11 0205 03/20/11 1216  WBC 10.5 7.3  HGB 15.5 16.8  PLT 148* 166    Basename 03/21/11 0205 03/20/11 1216  NA 135 136  K 4.5 4.7  CL 102 102  CO2 25 24  GLUCOSE 94 86  BUN 22 16  CREATININE 1.04 0.93    Basename 03/21/11 0934 03/21/11 0205  TROPONINI <0.30 <0.30   Hepatic Function Panel  Basename 03/21/11 0205 03/20/11 2140  PROT 6.2 --  ALBUMIN 3.4* --  AST 17 --  ALT 15 --  ALKPHOS 90 --  BILITOT 0.4 --  BILIDIR -- <0.1  IBILI -- NOT CALCULATED   No results found for this basename: CHOL in the last 72 hours No results found for this basename: PROTIME in the last 72 hours  Imaging: Imaging results have been reviewed  Cardiac Studies:  Assessment/Plan:  Patient Active Hospital Problem List: 1. Chronic recurrent atrial fib 2. 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  Will also check serum Fe/TIBC  Plan:  Reduce diltiazem in the face of low BP   LOS: 3 days    Cassell Clement 03/23/2011, 10:08 AM

## 2011-03-23 NOTE — Consult Note (Signed)
Patient Identification:  [name] Date of Evaluation:  03/23/2011   History of Present Illness:  Patient is 32 Caucasian male who asked to see psychiatrist for seeking help in his drinking problem. Patient endorse he has significant history of alcoholism for past 45 years. Patient admitted he had developed heart issues due to long-standing alcoholism and now he is willing to take serious action to get some help. Patient admitted that he has bouts of depression with social isolation but denies any active or passive suicidal thoughts. Patient recently lost his apartment due to alcoholism. He complained of insomnia and has given trazodone in the past but unsure why he stopped taking it. Patient lives by himself currently homeless. He denies any agitation anger or mood swings. Denies any psychosis.  Past psychiatric history Patient denies any previous history of psychiatric inpatient treatment or any suicidal attempt. As mentioned above he was given trazodone in the past by his primary care physician for insomnia which did work well. Patient has history of significant alcohol use any remember being detox many years ago. She admitted history of tremors shakes when he stop drinking abruptly. Patient admitted drinking one case of beer almost every day. He denies any other illegal drug use.     Past Medical History:     Past Medical History  Diagnosis Date  . Atrial fibrillation   . Angina   . Shortness of breath   . Hepatitis     hepatitis B  . Palpitations   . Hypertension        Past Surgical History  Procedure Date  . Mouth surgery     Allergies: No Known Allergies  Current Medications:  Prior to Admission medications   Medication Sig Start Date End Date Taking? Authorizing Provider  aspirin EC 81 MG tablet Take 81 mg by mouth daily.    Yes Historical Provider, MD  diltiazem (CARDIZEM CD) 180 MG 24 hr capsule Take 1 capsule (180 mg total) by mouth daily. 03/19/11 03/18/12 Yes Hollice Espy, MD    Social History:    reports that he has been smoking Cigarettes.  He has been smoking about 1 pack per day. He does not have any smokeless tobacco history on file. He reports that he drinks about 10.8 ounces of alcohol per week. He reports that he does not use illicit drugs.   Family History:    History reviewed. No pertinent family history.  Mental Status Examination/Evaluation: Objective:  Appearance: Fairly Groomed  Psychomotor Activity:  Decreased  Eye Contact::  Poor  Speech:  Normal Rate  Volume:  Decreased  Mood:  sad  Affect:  Restricted  Thought Process:    Orientation:  Full  Thought Content:    Suicidal Thoughts:  No  Homicidal Thoughts:  No  Judgement:  Intact  Insight:  Fair    DIAGNOSIS:   AXIS I Depressive disorder NOS, Etoh Dependence  AXIS II  Deffered  AXIS III See medical notes.  AXIS IV   AXIS V 51-60 moderate symptoms     Recommendations: At this patient does not need any detox treatment however he does need long-term rehabilitation program. Patient admitted that he had a good response in the past when he was in long-term program. Patient does not need any inpatient treatment at [facility] she and we will recommend to have long-term treatment and alcohol and rehabilitation program. Consider small dose trazodone for insomnia if it is not contraindicated.  2 

## 2011-03-24 LAB — BASIC METABOLIC PANEL
BUN: 21 mg/dL (ref 6–23)
CO2: 27 mEq/L (ref 19–32)
Calcium: 8.9 mg/dL (ref 8.4–10.5)
GFR calc non Af Amer: 71 mL/min — ABNORMAL LOW (ref >90)
Glucose, Bld: 87 mg/dL (ref 70–99)
Sodium: 139 mEq/L (ref 135–145)

## 2011-03-24 MED ORDER — LORAZEPAM 1 MG PO TABS: 1.0000 mg | ORAL_TABLET | Freq: Four times a day (QID) | ORAL | Status: DC | PRN

## 2011-03-24 MED ORDER — TRAZODONE HCL 100 MG PO TABS
100.0000 mg | ORAL_TABLET | Freq: Every evening | ORAL | Status: DC | PRN
  Filled 2011-03-24: qty 1

## 2011-03-24 NOTE — Progress Notes (Signed)
   Subjective:  Remains in atrial fib.  Rate is better controlled.  BP remains low.  Objective:  Vital Signs in the last 24 hours: Temp:  [97.3 F (36.3 C)] 97.3 F (36.3 C) (01/13 0409) Pulse Rate:  [53-98] 79  (01/13 0409) Resp:  [18-20] 20  (01/13 0409) BP: (70-110)/(40-71) 98/67 mmHg (01/13 0409) SpO2:  [95 %-100 %] 95 % (01/13 0409)  Intake/Output from previous day: 01/12 0701 - 01/13 0700 In: 340 [P.O.:340] Out: 700 [Urine:700] Intake/Output from this shift:       . aspirin EC  81 mg Oral Daily  . diltiazem  120 mg Oral Daily  . enoxaparin  40 mg Subcutaneous Q24H  . lisinopril  2.5 mg Oral Daily  . metoprolol succinate  12.5 mg Oral Daily  . nicotine  21 mg Transdermal Daily  . psyllium  1 packet Oral Daily  . zolpidem  5 mg Oral QHS  . DISCONTD: carvedilol  3.125 mg Oral BID WC  . DISCONTD: diltiazem  240 mg Oral Daily  . DISCONTD: lisinopril  5 mg Oral Daily      Physical Exam: The patient appears to be in no distress.  Head and neck exam reveals that the pupils are equal and reactive.  The extraocular movements are full.  There is no scleral icterus.  Mouth and pharynx are benign.  No lymphadenopathy.  No carotid bruits.  The jugular venous pressure is normal.  Thyroid is not enlarged or tender.  Chest is clear to percussion and auscultation.  No rales or rhonchi.  Expansion of the chest is symmetrical.  Heart reveals no abnormal lift or heave.  First and second heart sounds are normal.  There is no murmur gallop rub or click. Pulse irregular.  The abdomen is soft and nontender.  Bowel sounds are normoactive.  There is no hepatosplenomegaly or mass.  There are no abdominal bruits.  Extremities reveal no phlebitis or edema.  Pedal pulses are good.  There is no cyanosis or clubbing.  Neurologic exam is normal strength and no lateralizing weakness.  No sensory deficits.  Integument reveals no rash  Lab Results: No results found for this basename:  WBC:2,HGB:2,PLT:2 in the last 72 hours  Basename 03/24/11 0600  NA 139  K 4.5  CL 106  CO2 27  GLUCOSE 87  BUN 21  CREATININE 1.07   No results found for this basename: TROPONINI:2,CK,MB:2 in the last 72 hours Hepatic Function Panel No results found for this basename: PROT,ALBUMIN,AST,ALT,ALKPHOS,BILITOT,BILIDIR,IBILI in the last 72 hours No results found for this basename: CHOL in the last 72 hours No results found for this basename: PROTIME in the last 72 hours  Imaging: Imaging results have been reviewed  Cardiac Studies:  Assessment/Plan:  Patient Active Hospital Problem List: 1. Chronic recurrent atrial fib 2. 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  Will also check serum Fe/TIBC  Plan:  Observe BP response to change in meds.  Now off carvedilol and on low dose Toprol. Continue to exercise in hall.   LOS: 4 days    Cassell Clement 03/24/2011, 10:03 AM

## 2011-03-24 NOTE — Progress Notes (Signed)
[name]   PCP[facility] Appt-15th for card Appt 30th for appointment  Interval h/o:-  66 yo cm , known polysubstance abuse, chr hepatitis b, A.fib-s/p cardioversion in [facility] 2 mo ago re-presents with Atrial fibrillation after just being discharged   Subjective: Doing fair. Didn't get much sleep-despite sleep aids.  Happy with regular diet.  No further dizziness. No CP, some dizziness still when he first stands up.  On walking HR maintained in . Had stool  Treatment Team:  Peter Swaziland, MD Objective: Vital signs in last 24 hours: Temp:  [97.3 F (36.3 C)] 97.3 F (36.3 C) (01/13 0409) Pulse Rate:  [53-98] 79  (01/13 0409) Resp:  [18-20] 20  (01/13 0409) BP: (70-110)/(40-71) 98/67 mmHg (01/13 0409) SpO2:  [95 %-100 %] 95 % (01/13 0409) Weight change:   Intake/Output Summary (Last 24 hours) at 03/24/11 0902 Last data filed at 03/23/11 2025  Gross per 24 hour  Intake    240 ml  Output    700 ml  Net   -460 ml    BP 98/67  Pulse 79  Temp(Src) 97.3 F (36.3 C) (Oral)  Resp 20  Ht 6' (1.829 m)  Wt 89.858 kg (198 lb 1.6 oz)  BMI 26.87 kg/m2  SpO2 95% General appearance: alert, cooperative and flat affect Throat: lips, mucosa, and tongue normal; teeth and gums normal Lungs: clear to auscultation bilaterally Heart: regular rate and rhythm, S1, S2 normal, no murmur, click, rub or gallop Abdomen: soft, non-tender; bowel sounds normal; no masses,  no organomegaly Pulses: 2+ and symmetric Neurologic: Grossly normal  Lab Results:  Basename 03/24/11 0600  NA 139  K 4.5  CL 106  CO2 27  GLUCOSE 87  BUN 21  CREATININE 1.07  CALCIUM 8.9  MG --  PHOS --   No results found for this basename: AST:2,ALT:2,ALKPHOS:2,BILITOT:2,PROT:2,ALBUMIN:2 in the last 72 hours No results found for this basename: LIPASE:2,AMYLASE:2 in the last 72 hours No results found for this basename: WBC:2,NEUTROABS:2,HGB:2,HCT:2,MCV:2,PLT:2 in the last  72 hours  Basename 03/21/11 0934  CKTOTAL 31  CKMB 1.0  CKMBINDEX --  TROPONINI <0.30   No components found with this basename: POCBNP:3 No results found for this basename: DDIMER:2 in the last 72 hours No results found for this basename: HGBA1C:2 in the last 72 hours No results found for this basename: CHOL:2,HDL:2,LDLCALC:2,TRIG:2,CHOLHDL:2,LDLDIRECT:2 in the last 72 hours No results found for this basename: TSH,T4TOTAL,FREET3,T3FREE,THYROIDAB in the last 72 hours  Basename 03/23/11 1040  VITAMINB12 --  FOLATE --  FERRITIN --  TIBC 267  IRON 152*  RETICCTPCT --    Micro Results:   Medications: I have reviewed the patient's current medications. Scheduled Meds:    . aspirin EC  81 mg Oral Daily  . diltiazem  120 mg Oral Daily  . enoxaparin  40 mg Subcutaneous Q24H  . lisinopril  2.5 mg Oral Daily  . metoprolol succinate  12.5 mg Oral Daily  . nicotine  21 mg Transdermal Daily  . psyllium  1 packet Oral Daily  . zolpidem  5 mg Oral QHS  . DISCONTD: carvedilol  3.125 mg Oral BID WC  . DISCONTD: diltiazem  240 mg Oral Daily  . DISCONTD: lisinopril  5 mg Oral Daily   Continuous Infusions:  PRN Meds:.acetaminophen, acetaminophen, levalbuterol, LORazepam, ondansetron (ZOFRAN) IV, ondansetron  Assessment/Plan: Patient Active Problem List  Diagnoses  . Atrial fibrillation with RVR-Meds changed by cards and not in Afib-tele shows pauses. Have changed coreg to  toprol xl 12.5 Lisinopril 5--->2.5 Cardizem 120 Cd with hold parameters ECHO  Ef 25-30% Not a coumadin candidate given propensity for ETOH, plus risk of fall and traumatic bleeding   . ALCOHOL ABUSE+TOBACCO ABUSE-was at [facility] exhausted most options.  Has a place to go 1.17.12 Working with Child psychotherapist to get placement-Psych consult appreciated depression and ETOH use  . DEPRESSION-Continue Ativan-change to 1 mg qid as anxious .  Marland Kitchen CHF-EF 25-30%-app cardiology input.  Marland Kitchen HEMORRHOIDS-symptomatic rx  . CHEST  PAIN-fleeting and not likely cardiac  . HEPATITIS B-unclear if ever Rx-LFT's wnl Needs screening US q 35mo-52yr-Was told by MD's in the past that would need to be followed at the outpatient clinic.     LOS: 4 days   Shanta Hartner,JAI 03/24/2011, 9:02 AM

## 2011-03-25 ENCOUNTER — Encounter (HOSPITAL_COMMUNITY): Payer: Self-pay | Admitting: Nurse Practitioner

## 2011-03-25 DIAGNOSIS — I426 Alcoholic cardiomyopathy: Secondary | ICD-10-CM | POA: Diagnosis present

## 2011-03-25 MED ORDER — LORAZEPAM 1 MG PO TABS
1.0000 mg | ORAL_TABLET | Freq: Four times a day (QID) | ORAL | Status: DC | PRN
  Administered 2011-03-26: 1 mg via ORAL
  Filled 2011-03-25: qty 1

## 2011-03-25 MED ORDER — LORAZEPAM 0.5 MG PO TABS
0.5000 mg | ORAL_TABLET | Freq: Once | ORAL | Status: AC
  Administered 2011-03-25: 0.5 mg via ORAL
  Filled 2011-03-25: qty 1

## 2011-03-25 MED ORDER — TRAZODONE HCL 150 MG PO TABS
150.0000 mg | ORAL_TABLET | Freq: Every evening | ORAL | Status: DC | PRN
  Administered 2011-03-25 – 2011-03-26 (×2): 150 mg via ORAL
  Filled 2011-03-25 (×2): qty 1

## 2011-03-25 MED ORDER — LORAZEPAM 0.5 MG PO TABS: 0.5000 mg | ORAL_TABLET | Freq: Four times a day (QID) | ORAL | Status: DC | PRN

## 2011-03-25 NOTE — Progress Notes (Signed)
@   Subjective:  Denies CP or dyspnea   Objective:  Filed Vitals:   03/24/11 1500 03/24/11 2003 03/25/11 0428 03/25/11 0500  BP: 109/76 90/61 125/76   Pulse:  86 65   Temp:  97.3 F (36.3 C) 97 F (36.1 C)   TempSrc:  Oral Oral   Resp:  18 19   Height:      Weight:    202 lb 14.4 oz (92.035 kg)  SpO2:  98% 97%     Intake/Output from previous day:  Intake/Output Summary (Last 24 hours) at 03/25/11 0835 Last data filed at 03/25/11 4098  Gross per 24 hour  Intake    240 ml  Output    500 ml  Net   -260 ml    Physical Exam: Physical exam: Well-developed well-nourished in no acute distress.  Skin is warm and dry.  HEENT is normal.  Neck is supple. No thyromegaly.  Chest is clear to auscultation with normal expansion.  Cardiovascular exam is irregular Abdominal exam nontender or distended. No masses palpated. Extremities show no edema. neuro grossly intact    Lab Results: Basic Metabolic Panel:  Basename 03/24/11 0600  NA 139  K 4.5  CL 106  CO2 27  GLUCOSE 87  BUN 21  CREATININE 1.07  CALCIUM 8.9  MG --  PHOS --   CBC: No results found for this basename: WBC:2,NEUTROABS:2,HGB:2,HCT:2,MCV:2,PLT:2 in the last 72 hours Cardiac Enzymes: No results found for this basename: CKTOTAL:3,CKMB:3,CKMBINDEX:3,TROPONINI:3 in the last 72 hours   Assessment/Plan:  1) Afib - Continue cardizem and toprol; HR controlled; continue ASA; not a coumadin candidate due to ETOH and noncompliance 2) Cardiomyopathy - continue low dose ACEI (can be dced if BP becomes problematic); continue toprol; most likely related to ETOH and uncontrolled atrial fibrillation 3) ETOH - This is his main issue; we cannot help this gentleman unless he takes his medications and is treated for alcoholism.   [name] 03/25/2011, 8:35 AM

## 2011-03-25 NOTE — Progress Notes (Signed)
Utilization review complete 

## 2011-03-25 NOTE — Progress Notes (Signed)
[name]   PCP[facility] Appt-15th for card Appt 30th for appointment  Interval h/o:-  66 yo cm , known polysubstance abuse, chr hepatitis b, A.fib-s/p cardioversion in [facility] 2 mo ago re-presents with Atrial fibrillation after just being discharged   Subjective: Doing fair. Didn't get much sleep-despite sleep aids.   No further dizziness. No CP, low blood pressures.  On walking HR maintained less than 100. Concerned about meds and also about drop in Bp NO other co  Treatment Team:  Peter Swaziland, MD Objective: Vital signs in last 24 hours: Temp:  [96.3 F (35.7 C)-97.5 F (36.4 C)] 97.3 F (36.3 C) (01/14 1419) Pulse Rate:  [65-92] 92  (01/14 1419) Resp:  [18-20] 18  (01/14 1419) BP: (90-125)/(61-85) 113/76 mmHg (01/14 1419) SpO2:  [95 %-99 %] 99 % (01/14 1419) Weight:  [92.035 kg (202 lb 14.4 oz)] 92.035 kg (202 lb 14.4 oz) (01/14 0500) Weight change:   Intake/Output Summary (Last 24 hours) at 03/25/11 1837 Last data filed at 03/25/11 1700  Gross per 24 hour  Intake    840 ml  Output   1500 ml  Net   -660 ml    BP 113/76  Pulse 92  Temp(Src) 97.3 F (36.3 C) (Oral)  Resp 18  Ht 6' (1.829 m)  Wt 92.035 kg (202 lb 14.4 oz)  BMI 27.52 kg/m2  SpO2 99% General appearance: alert, cooperative and flat affect Throat: lips, mucosa, and tongue normal; teeth and gums normal Lungs: clear to auscultation bilaterally Heart: regular rate and rhythm, S1, S2 normal, no murmur, click, rub or gallop Abdomen: soft, non-tender; bowel sounds normal; no masses,  no organomegaly Pulses: 2+ and symmetric Neurologic: Grossly normal  Lab Results:  Basename 03/24/11 0600  NA 139  K 4.5  CL 106  CO2 27  GLUCOSE 87  BUN 21  CREATININE 1.07  CALCIUM 8.9  MG --  PHOS --   No results found for this basename: AST:2,ALT:2,ALKPHOS:2,BILITOT:2,PROT:2,ALBUMIN:2 in the last 72 hours No results found for this basename: LIPASE:2,AMYLASE:2 in  the last 72 hours No results found for this basename: WBC:2,NEUTROABS:2,HGB:2,HCT:2,MCV:2,PLT:2 in the last 72 hours No results found for this basename: CKTOTAL:3,CKMB:3,CKMBINDEX:3,TROPONINI:3 in the last 72 hours No components found with this basename: POCBNP:3 No results found for this basename: DDIMER:2 in the last 72 hours No results found for this basename: HGBA1C:2 in the last 72 hours No results found for this basename: CHOL:2,HDL:2,LDLCALC:2,TRIG:2,CHOLHDL:2,LDLDIRECT:2 in the last 72 hours No results found for this basename: TSH,T4TOTAL,FREET3,T3FREE,THYROIDAB in the last 72 hours  Basename 03/23/11 1040  VITAMINB12 --  FOLATE --  FERRITIN --  TIBC 267  IRON 152*  RETICCTPCT --    Micro Results:   Medications: I have reviewed the patient's current medications. Scheduled Meds:    . aspirin EC  81 mg Oral Daily  . diltiazem  120 mg Oral Daily  . enoxaparin  40 mg Subcutaneous Q24H  . lisinopril  2.5 mg Oral Daily  . LORazepam  0.5 mg Oral Once  . metoprolol succinate  12.5 mg Oral Daily  . nicotine  21 mg Transdermal Daily  . psyllium  1 packet Oral Daily  . zolpidem  5 mg Oral QHS   Continuous Infusions:  PRN Meds:.acetaminophen, acetaminophen, levalbuterol, LORazepam, traZODone, DISCONTD: LORazepam, DISCONTD: LORazepam, DISCONTD: traZODone  Assessment/Plan: Patient Active Problem List  Diagnoses  . Atrial fibrillation with RVR-Meds changed by cards and not in Afib-tele shows pauses. Have changed coreg to toprol xl 12.5  Lisinopril 5--->2.5--Will d/c Cardizem 120 Cd-->60 with hold parameters ECHO  Ef 25-30% Not a coumadin candidate given propensity for ETOH, plus risk of fall and traumatic bleeding   . ALCOHOL ABUSE+TOBACCO ABUSE-was at [facility] exhausted most options.  Has a place to go 1.17.12.  Unlikely safe to d/c patient Psychiatry assistance appreciated-Would probably d/c to [facility] 04/01/10 for better care/Supervision Working with Child psychotherapist to  get placement-Psych consult appreciated depression and ETOH use  . DEPRESSION-Continue Ativan-change to 1 mg qid as anxious .  Marland Kitchen CHF-EF 25-30%-app cardiology input.  Marland Kitchen HEMORRHOIDS-symptomatic rx  . CHEST PAIN-fleeting and not likely cardiac  . HEPATITIS B-unclear if ever Rx-LFT's wnl Needs screening US q 20mo-74yr-Was told by MD's in the past that would need to be followed at the outpatient clinic.    D/c when safe plan in place-Has had multiple re-admissions   LOS: 5 days   [facility] 03/25/2011, 6:37 PM

## 2011-03-25 NOTE — Progress Notes (Signed)
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  Hasn't slept well in 3 nights.  Felt bad throughout day yesterday - no energy.  Attributes it to BP running low (86/63 @ 1345 yesterday).  No chest pain, sob, palpitations, presyncope.   CURRENT MEDS    . aspirin EC  81 mg Oral Daily  . diltiazem  120 mg Oral Daily  . enoxaparin  40 mg Subcutaneous Q24H  . lisinopril  2.5 mg Oral Daily  . metoprolol succinate  12.5 mg Oral Daily  . nicotine  21 mg Transdermal Daily  . psyllium  1 packet Oral Daily  . zolpidem  5 mg Oral QHS    OBJECTIVE  Filed Vitals:   03/24/11 1500 03/24/11 2003 03/25/11 0428 03/25/11 0500  BP: 109/76 90/61 125/76   Pulse:  86 65   Temp:  97.3 F (36.3 C) 97 F (36.1 C)   TempSrc:  Oral Oral   Resp:  18 19   Height:      Weight:    202 lb 14.4 oz (92.035 kg)  SpO2:  98% 97%     Intake/Output Summary (Last 24 hours) at 03/25/11 0655 Last data filed at 03/24/11 1300  Gross per 24 hour  Intake      0 ml  Output    500 ml  Net   -500 ml    PHYSICAL EXAM  General: Well developed, well nourished, in no acute distress. Head: Normocephalic, atraumatic, sclera non-icteric, no xanthomas, nares are without discharge.  Neck: Supple without bruits or JVD. Lungs:  Resp regular and unlabored, CTA. Heart: IR, IR no s3, s4, or murmurs. Abdomen: Soft, non-tender, non-distended, BS + x 4.  Msk:  Strength and tone appears normal for age. Extremities: No clubbing, cyanosis or edema. DP/PT/Radials 2+ and equal bilaterally. Neuro: Alert and oriented X 3. Moves all extremities spontaneously. Psych: Normal affect.  LABS:  Basic Metabolic Panel:  Basename 03/24/11 0600  NA 139  K 4.5  CL 106  CO2 27  GLUCOSE 87  BUN 21  CREATININE 1.07    CALCIUM 8.9  MG --  PHOS --    TELE  A.fib, 70's - 80's.  occas pause in 2 second range.  None greater than 2.5 seconds.  ASSESSMENT AND PLAN:  1.  Afib:  In setting of etoh, dilated cm, and noncompliance.  Rate is reasonably well controlled over the past 24 hrs, trending in the 70's-80's for the most part.  Pt feels as though he has no energy, which may be r/t low bp's during the day after meds.  Says that after dccv in [place] a few months ago that he felt great.  Says he was not anticoagulated after that dccv and has no desire to be anticoagulated now - making possibility of tee/dccv (and prob antiarrhythmic) nil.  Cont asa, low-dose bb.  With borderline bp's and ? Symptomatic hypotn, consider transitioning off of Dilt (esp with low EF) and onto Digoxin.  2.  NICM/Dilated ETOH CM:  Euvolemic.  Cont low-dose bb/acei.  BP too soft for titration @ this time.  Consider addition of Digoxin and discontinuation of Dilt.  Will d/w MD.  3.  ETOH Abuse:  Pending rehab admission 1/22.  SW involved.  Cont cessation advised.  Pt concerned re: poss d/c prior to rehab admission - feels he will drink again.  4.  Tob. Abuse:  Cessation advised.   Signed, Nicolasa Ducking NP

## 2011-03-25 NOTE — Progress Notes (Signed)
Pt ambulated in hallway without any distress or HR elevation.  Pt ambulated 400' and then 400' again to make sure no problem with HR elevation.  Pt concerned about not sleeping well at night and possibly needing to restart taking some medications he was taking in the past for depression and sleeplessness. [name]

## 2011-03-26 DIAGNOSIS — F1010 Alcohol abuse, uncomplicated: Secondary | ICD-10-CM

## 2011-03-26 MED ORDER — METOPROLOL SUCCINATE ER 25 MG PO TB24
25.0000 mg | ORAL_TABLET | Freq: Every day | ORAL | Status: DC
  Administered 2011-03-26: 25 mg via ORAL
  Filled 2011-03-26 (×2): qty 1

## 2011-03-26 MED ORDER — DILTIAZEM HCL ER 60 MG PO CP12
60.0000 mg | ORAL_CAPSULE | Freq: Two times a day (BID) | ORAL | Status: DC
  Administered 2011-03-27: 60 mg via ORAL
  Filled 2011-03-26 (×2): qty 1

## 2011-03-26 NOTE — Progress Notes (Signed)
@   Subjective:  Denies CP or dyspnea   Objective:  Filed Vitals:   03/25/11 2100 03/25/11 2112 03/26/11 0450 03/26/11 0505  BP:  114/82 119/76   Pulse: 93 54 86   Temp:  97.6 F (36.4 C) 97.4 F (36.3 C)   TempSrc:  Oral Oral   Resp:  18 18   Height:      Weight:    202 lb 9.6 oz (91.9 kg)  SpO2:  98% 95%     Intake/Output from previous day:  Intake/Output Summary (Last 24 hours) at 03/26/11 2956 Last data filed at 03/25/11 2100  Gross per 24 hour  Intake    840 ml  Output   1900 ml  Net  -1060 ml    Physical Exam: Physical exam: Well-developed well-nourished in no acute distress.  Skin is warm and dry.  HEENT is normal.  Neck is supple. No thyromegaly.  Chest is clear to auscultation with normal expansion.  Cardiovascular exam is irregular Abdominal exam nontender or distended. No masses palpated. Extremities show no edema. neuro grossly intact    Lab Results: Basic Metabolic Panel:  Basename 03/24/11 0600  NA 139  K 4.5  CL 106  CO2 27  GLUCOSE 87  BUN 21  CREATININE 1.07  CALCIUM 8.9  MG --  PHOS --     Assessment/Plan:  1) Afib - Continue cardizem and toprol (increase to 25 mg daily as HR mildly elevated early AM); continue ASA; not a coumadin candidate due to ETOH and noncompliance 2) Cardiomyopathy - continue lisinopril at 2.5 mg daily; continue toprol; most likely related to ETOH and uncontrolled atrial fibrillation; repeat echo in 3 months if he abstains from ETOH and is compliant with meds. 3) ETOH - This is his main issue; we cannot help this gentleman unless he takes his medications and is treated for alcoholism. Primary care is working on rehabilitation We will follow from a distance.  [name] 03/26/2011, 6:09 AM

## 2011-03-26 NOTE — Consult Note (Signed)
Reason for Consult:Evaluate Suicidal Ideation, alcohol dependence Referring Physician: Dr.Santanti  [name] is an 66 y.o. male.  HPI: Pt admitted for very rapid heart beat, profuse sweating; found to be in unstable A-fib. History of not taking his medications Discussed with Dr. Allen Norris who says he is in stable A-fib  Past Medical History  Diagnosis Date  . Atrial fibrillation   . Angina   . Shortness of breath   . Hepatitis     hepatitis B  . Palpitations   . Hypertension   . 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 03/21/2011 - 2D Echo: EF 25% to 30%. Diffuse hypokinesis.    Past Surgical History  Procedure Date  . Mouth surgery     History reviewed. No pertinent family history.  Social History:  reports that he has been smoking Cigarettes.  He has been smoking about 1 pack per day. He does not have any smokeless tobacco history on file. He reports that he drinks about 10.8 ounces of alcohol per week. He reports that he does not use illicit drugs.  Allergies: No Known Allergies  Medications: I have reviewed the patient"s medications   Results for orders placed during the hospital encounter of 03/20/11 (from the past 48 hour(s))  BASIC METABOLIC PANEL     Status: Abnormal   Collection Time   03/24/11  6:00 AM      Component Value Range Comment   Sodium 139  135 - 145 (mEq/L)    Potassium 4.5  3.5 - 5.1 (mEq/L)    Chloride 106  96 - 112 (mEq/L)    CO2 27  19 - 32 (mEq/L)    Glucose, Bld 87  70 - 99 (mg/dL)    BUN 21  6 - 23 (mg/dL)    Creatinine, Ser 4.54  0.50 - 1.35 (mg/dL)    Calcium 8.9  8.4 - 10.5 (mg/dL)    GFR calc non Af Amer 71 (*) >90 (mL/min)    GFR calc Af Amer 83 (*) >90 (mL/min)     No results found.  Review of Systems  Unable to perform ROS  Blood pressure 114/82, pulse 54, temperature 97.6 F (36.4 C), temperature source Oral, resp. rate 18, height 6' (1.829 m), weight 92.035 kg (202 lb 14.4 oz), SpO2 98.00%. Physical  Exam  Assessment/Plan: Pt is awake, alert and cognitively intact; able to provide chronological history  He has been drinking with "buddies" 6-8 cans beer/day for some time.  He has retired and says he was not taking his medication.  He says that he has a rehab program 04/02/11 and Dr. Allen Norris is planning discharge for this homeless man to a motel until his rehab date of admission at [facility].  Pt states emphaticaly that this episode really scared him.  He does not want to risk a worse situation with A-Fib.  He states that is foremost in his mind and dares not take any alcohol or experience something worse.  His statement "I will drink myself to death" is most likely, based on this interview an insight more than a threat.  There is always a risk of relapse, especially if he gets with his friends or has some 'trigger'.  Another factor against relapse is that he has no money. He has generated an impression that he is more scared of the consequences of drinking with his A-fib than trying to kill himself  He denies SI RECOMMENDATON:  1. Pt is aware of pros and  cons of staying in a motel until 04/02/11  2. Pt is cognitively aware and has capacity to decide to enter recommended program and avoid alcohol 3.Pt is aware of risk -benefit of adhering to medication regimen and avoiding alcohol; is committed today to go to rehab  Labrenda Lasky 03/26/2011, 12:14 AM

## 2011-03-26 NOTE — Progress Notes (Signed)
Pt up using the bathroom and brushing teeth.  HR went up to 150' s.  Pt said he felt fine not symptomatic.  Will continue to monitor.  Morning meds given. Thomas Hoff

## 2011-03-26 NOTE — Progress Notes (Signed)
[name]   PCP-[facility] Appt-15th for card Appt 30th for appointment  Interval h/o:-  66 yo cm , known polysubstance abuse, chr hepatitis b, A.fib-s/p cardioversion in [facility] [place] [facility] 2 mo ago re-presents with Atrial fibrillation after just being discharged   Subjective: Doing fair.  Sleep better-despite sleep aids.   No further dizziness. No CP, low blood pressures.  Walked up and down the halls No other c/o  Treatment Team:  Peter Swaziland, MD Objective: Vital signs in last 24 hours: Temp:  [97 F (36.1 C)-97.6 F (36.4 C)] 97 F (36.1 C) (01/15 1320) Pulse Rate:  [54-93] 89  (01/15 1320) Resp:  [18-20] 20  (01/15 1320) BP: (90-119)/(68-82) 90/73 mmHg (01/15 1320) SpO2:  [95 %-98 %] 96 % (01/15 1320) Weight:  [91.9 kg (202 lb 9.6 oz)] 91.9 kg (202 lb 9.6 oz) (01/15 0505) Weight change: -0.135 kg (-4.8 oz)  Intake/Output Summary (Last 24 hours) at 03/26/11 1654 Last data filed at 03/26/11 1300  Gross per 24 hour  Intake    840 ml  Output   1050 ml  Net   -210 ml    BP 90/73  Pulse 89  Temp(Src) 97 F (36.1 C) (Oral)  Resp 20  Ht 6' (1.829 m)  Wt 91.9 kg (202 lb 9.6 oz)  BMI 27.48 kg/m2  SpO2 96% General appearance: alert, cooperative and flat affect Throat: lips, mucosa, and tongue normal; teeth and gums normal Lungs: clear to auscultation bilaterally Heart: regular rate and rhythm, S1, S2 normal, no murmur, click, rub or gallop Abdomen: soft, non-tender; bowel sounds normal; no masses,  no organomegaly Pulses: 2+ and symmetric Neurologic: Grossly normal  Lab Results:  Basename 03/24/11 0600  NA 139  K 4.5  CL 106  CO2 27  GLUCOSE 87  BUN 21  CREATININE 1.07  CALCIUM 8.9  MG --  PHOS --   No results found for this basename: AST:2,ALT:2,ALKPHOS:2,BILITOT:2,PROT:2,ALBUMIN:2 in the last 72 hours No results found for this basename: LIPASE:2,AMYLASE:2 in the last 72 hours No results found for this basename:  WBC:2,NEUTROABS:2,HGB:2,HCT:2,MCV:2,PLT:2 in the last 72 hours No results found for this basename: CKTOTAL:3,CKMB:3,CKMBINDEX:3,TROPONINI:3 in the last 72 hours No components found with this basename: POCBNP:3 No results found for this basename: DDIMER:2 in the last 72 hours No results found for this basename: HGBA1C:2 in the last 72 hours No results found for this basename: CHOL:2,HDL:2,LDLCALC:2,TRIG:2,CHOLHDL:2,LDLDIRECT:2 in the last 72 hours No results found for this basename: TSH,T4TOTAL,FREET3,T3FREE,THYROIDAB in the last 72 hours No results found for this basename: VITAMINB12:2,FOLATE:2,FERRITIN:2,TIBC:2,IRON:2,RETICCTPCT:2 in the last 72 hours  Micro Results:   Medications: I have reviewed the patient's current medications. Scheduled Meds:    . aspirin EC  81 mg Oral Daily  . diltiazem  60 mg Oral Q12H  . enoxaparin  40 mg Subcutaneous Q24H  . lisinopril  2.5 mg Oral Daily  . metoprolol succinate  25 mg Oral Daily  . nicotine  21 mg Transdermal Daily  . psyllium  1 packet Oral Daily  . zolpidem  5 mg Oral QHS  . DISCONTD: diltiazem  120 mg Oral Daily  . DISCONTD: metoprolol succinate  12.5 mg Oral Daily   Continuous Infusions:  PRN Meds:.acetaminophen, acetaminophen, levalbuterol, LORazepam, traZODone  Assessment/Plan: Patient Active Problem List  Diagnoses  . Atrial fibrillation with RVR-Meds changed by cards and not in Afib-tele shows pauses. Have changed coreg to toprol xl 12.5 Lisinopril 5--->2.5--Will d/c Cardizem 120 Cd-->60 with hold parameters ECHO  Ef 25-30% Not a  coumadin candidate given propensity for ETOH, plus risk of fall and traumatic bleeding   . ALCOHOL ABUSE+TOBACCO ABUSE-was at [facility] exhausted most options.  Has a place to go 1.17.12.  Unlikely safe to d/c patient Psychiatry assistance appreciated-patient thinks he is ready for d/c and wishes to d/c to motel short-term Psych consult appreciated depression and ETOH use.  Marland Kitchen DEPRESSION-Continue  Ativan-change to 1 mg qid as anxious .  Marland Kitchen CHF-EF 25-30%-app cardiology input.  Marland Kitchen HEMORRHOIDS-symptomatic rx.  . CHEST PAIN-fleeting and not likely cardiac.  . HEPATITIS B-unclear if ever Rx-LFT's wnl Needs screening US q 14mo-58yr-Was told by MD's in the past that would need to be followed at the outpatient clinic.    D/c when safe plan in place-Has had multiple re-admissions   LOS: 6 days   [facility] 03/26/2011, 4:54 PM

## 2011-03-27 ENCOUNTER — Encounter (HOSPITAL_COMMUNITY): Payer: Self-pay | Admitting: Internal Medicine

## 2011-03-27 MED ORDER — LISINOPRIL 2.5 MG PO TABS: 2.5000 mg | ORAL_TABLET | Freq: Every day | ORAL | Status: DC

## 2011-03-27 MED ORDER — DILTIAZEM HCL ER 60 MG PO CP12: 60.0000 mg | ORAL_CAPSULE | Freq: Two times a day (BID) | ORAL | Status: DC

## 2011-03-27 MED ORDER — TRAZODONE HCL 150 MG PO TABS: 150.0000 mg | ORAL_TABLET | Freq: Every evening | ORAL | Status: DC | PRN

## 2011-03-27 MED ORDER — METOPROLOL SUCCINATE ER 25 MG PO TB24: 25.0000 mg | ORAL_TABLET | Freq: Every day | ORAL | Status: DC

## 2011-03-27 MED ORDER — ZOLPIDEM TARTRATE 5 MG PO TABS: 5.0000 mg | ORAL_TABLET | Freq: Every day | ORAL | Status: DC

## 2011-03-27 NOTE — Discharge Summary (Signed)
Admit date: 03/20/2011 Discharge date: 03/27/2011  Primary Care Physician:  Sheila Oats, MD   Discharge Diagnoses:   Active Hospital Problems  Diagnoses Date Noted   . 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 ALCOHOL ABUSE 01/18/2009   . TOBACCO ABUSE 01/18/2009   . HEPATITIS B 01/17/2009   . DEPRESSION 01/17/2009   . HEMORRHOIDS 01/17/2009     Resolved Hospital Problems  Diagnoses Date Noted Date Resolved  . Atrial fibrillation 01/17/2009 03/27/2011     DISCHARGE MEDICATION: Current Discharge Medication List    START taking these medications   Details  diltiazem (CARDIZEM SR) 60 MG 12 hr capsule Take 1 capsule (60 mg total) by mouth every 12 (twelve) hours. Qty: 60 capsule, Refills: 0    lisinopril (PRINIVIL,ZESTRIL) 2.5 MG tablet Take 1 tablet (2.5 mg total) by mouth daily. Qty: 30 tablet, Refills: 0    metoprolol succinate (TOPROL-XL) 25 MG 24 hr tablet Take 1 tablet (25 mg total) by mouth daily. Qty: 30 tablet, Refills: 0    traZODone (DESYREL) 150 MG tablet Take 1 tablet (150 mg total) by mouth at bedtime as needed for sleep. Qty: 30 tablet, Refills: 0    zolpidem (AMBIEN) 5 MG tablet Take 1 tablet (5 mg total) by mouth at bedtime. Qty: 30 tablet, Refills: 0      CONTINUE these medications which have NOT CHANGED   Details  aspirin EC 81 MG tablet Take 81 mg by mouth daily.       STOP taking these medications     diltiazem (CARDIZEM CD) 180 MG 24 hr capsule            Consults: Treatment Team:  Peter Swaziland, MD   SIGNIFICANT DIAGNOSTIC STUDIES:  Dg Chest 2 View  03/20/2011  *RADIOLOGY REPORT*  Clinical Data: Rapid atrial fibrillation, cough, chest pain  CHEST - 2 VIEW  Comparison: 03/06/2011  Findings: Upper-normal size of cardiac silhouette. Atherosclerotic calcification within minimally tortuous thoracic aorta. Mediastinal contours and pulmonary vascularity otherwise normal. Emphysematous and minimal chronic bronchitic changes. Minimal  linear scarring versus subsegmental atelectasis right lower lobe. No pulmonary infiltrate, pleural effusion, or pneumothorax. Diffuse osseous demineralization.  IMPRESSION: Emphysematous and minimal bronchitic changes. No acute abnormalities.  Original Report Authenticated By: Lollie Marrow, M.D.   Dg Chest Portable 1 View  03/06/2011  *RADIOLOGY REPORT*  Clinical Data: Left lower chest pain for 5-6 days; shortness of breath and chronic cough.  Congestion and wheezing.  History of smoking.  PORTABLE CHEST - 1 VIEW  Comparison: Chest radiograph performed 02/11/2011  Findings: The lungs are well-aerated.  Chronic interstitial prominence is again noted.  There is no evidence of focal opacification, pleural effusion or pneumothorax.  The costophrenic angles are incompletely imaged on this study.  The heart is normal in size; the mediastinal contour is within normal limits.  No acute osseous abnormalities are seen.  IMPRESSION: No acute cardiopulmonary process seen; mild chronic lung changes again noted.  Original Report Authenticated By: Tonia Ghent, M.D.     ECHO: Study Conclusions  - Left ventricle: The cavity size was normal. Wall thickness was increased in a pattern of mild LVH. Systolic function was severely reduced. The estimated ejection fraction was in the range of 25% to 30%. Diffuse hypokinesis. - Left atrium: The atrium was mildly dilated. - Right ventricle: The cavity size was moderately dilated. - Right atrium: The atrium was moderately dilated.  No results found for this or any previous visit (from the past 240 hour(s)).  BRIEF ADMITTING H & P: Patient is a 66 year old white male who is homeless with past oral history alcohol abuse, hepatitis B and atrial fibrillation who presented with atrial fibrillation with rapid ventricular response. He was recently admitted and discharged between the sixth and ninth of January 2013, to shelter and is awaiting placement at [facility] . He has ran out of his indigent account, and did not fill his prescription for Cardizem. He presented to the ER with a heart rate in the 120s. He has been going back and forth between the shelter and day [place]. He was returned to the ER as he was without his medications.  During her last admission, cardiac Enzymes x3 negative . He was initially started on a Cardizem drip and his rate was controlled. He was evaluated by cardiology who increased his Cardizem from 120 mg by mouth daily to 240 mg by mouth daily. Patient active he started having some episodes of bradycardia and he was changed to 180 mg by mouth daily. He was last seen by Dr. [name] on 03/19/2011.   today he denies any chest pain or any shortness of breath, he was initiated on a Cardizem drip after receiving her 20 mg IV bolus. His heart rate is currently in the 90s and is hemodynamically stable full   Active Hospital Problems  Diagnoses Date Noted   . 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 They recommended to start the patient on lisinopril and beta blocker. In counseling about alcohol consumption which is probably the most likely cause of his dilated cardiomyopathy.     . ALCOHOL ABUSE He was admitted to the hospital started on thiamine and folate no signs of withdrawals.  01/18/2009   . TOBACCO ABUSE: He was started on nicotine patch. Here in the hospital he does not want to quit at this time.  01/18/2009   . HEPATITIS B: Chronic he will be followed by his primary care doctor over in [place].  01/17/2009   . DEPRESSION: Currently stable. 01/17/2009   . HEMORRHOIDS: His hemoglobin has remained stable. No signs of bleeding.  01/17/2009     Resolved Hospital Problems  Diagnoses Date Noted Date Resolved  . Atrial  fibrillation: His chloride was stopped due to his blood pressure he was started on metoprolol. His Cardizem was changed to 60 twice a day. He remained in the hospital 2 days with his heart rate controlled and his current dose. He had no episodes of bradycardia or rapid A. fib with this current dose. On admission his cardiac enzymes were cycled which were negative x3. A 2-D echo was done with results as above. He will follow up with his primary care doctor over at [place]. 01/17/2009 03/27/2011     Disposition and Follow-up:  Discharge Orders    Future Orders Please Complete By Expires   Diet - low sodium heart healthy      Increase activity slowly        Follow-up Information    Follow up with [facility] in 1 week. (If symptoms worsen)           DISCHARGE EXAM:  General appearance: alert, cooperative and flat affect  Throat: lips, mucosa, and tongue normal; teeth and gums normal  Lungs: clear to auscultation bilaterally  Heart:  regular rate and rhythm, S1, S2 normal, no murmur, click, rub or gallop  Abdomen: soft, non-tender; bowel sounds normal; no masses, no organomegaly  Pulses: 2+ and symmetric  Neurologic: Grossly normal   Blood pressure 111/76, pulse 64, temperature 97.5 F (36.4 C), temperature source Axillary, resp. rate 19, height 6' (1.829 m), weight 93.305 kg (205 lb 11.2 oz), SpO2 94.00%.   Signed: Marinda Elk M.D. 03/27/2011, 8:16 AM

## 2011-03-27 NOTE — Progress Notes (Signed)
Pt DC'd with f/u appts, prescriptions, and DC instructions given and reviewed.

## 2011-03-27 NOTE — Progress Notes (Signed)
   CARE MANAGEMENT NOTE 03/27/2011  Patient:  [name], [name]   Account Number:  [id number]  Date Initiated:  03/22/2011  Documentation initiated by:  Donn Pierini  Subjective/Objective Assessment:   Pt admitted with afib     Action/Plan:   PTA pt was to go to shelter was just discharged from hospital with arrangements to go to [facility]   Anticipated DC Date:  03/23/2011   Anticipated DC Plan:  HOME/SELF CARE  In-house referral  Clinical Social Worker      DC Planning Services  CM consult      Choice offered to / List presented to:             Status of service:  Completed, signed off Medicare Important Message given?   (If response is "NO", the following Medicare IM given date fields will be blank) Date Medicare IM given:   Date Additional Medicare IM given:    Discharge Disposition:  HOME/SELF CARE  Per UR Regulation:  Reviewed for med. necessity/level of care/duration of stay  Comments:  03/27/11- 0900- Donn Pierini RN BSN [phone] Pt discharged this am, no further d/c needs.  03/22/11- 1445- Donn Pierini RN, BSN [phone] Pt has f/u appointments scheduled with [facility] from his early Dec. admission. pt also has been assisted with medications so is not eligible for assistance at this time. CSW following for social issues and d/c plan.

## 2011-10-25 ENCOUNTER — Emergency Department: Payer: Self-pay | Admitting: Emergency Medicine

## 2011-10-25 LAB — CBC
HCT: 48.8 % (ref 40.0–52.0)
HGB: 16.5 g/dL (ref 13.0–18.0)
MCH: 31.8 pg (ref 26.0–34.0)
MCHC: 33.7 g/dL (ref 32.0–36.0)
MCV: 94 fL (ref 80–100)
RBC: 5.17 10*6/uL (ref 4.40–5.90)

## 2011-10-25 LAB — COMPREHENSIVE METABOLIC PANEL
Albumin: 3.8 g/dL (ref 3.4–5.0)
BUN: 16 mg/dL (ref 7–18)
Calcium, Total: 9 mg/dL (ref 8.5–10.1)
Creatinine: 0.95 mg/dL (ref 0.60–1.30)
EGFR (African American): >60
Glucose: 53 mg/dL — ABNORMAL LOW (ref 65–99)
SGOT(AST): 28 U/L (ref 15–37)
SGPT (ALT): 22 U/L (ref 12–78)
Total Protein: 7.2 g/dL (ref 6.4–8.2)

## 2011-10-25 LAB — CK TOTAL AND CKMB (NOT AT ARMC)
CK, Total: 132 U/L (ref 35–232)
CK-MB: 1.2 ng/mL (ref 0.5–3.6)

## 2011-10-25 LAB — TROPONIN I: Troponin-I: <0.02 ng/mL

## 2011-11-03 ENCOUNTER — Encounter (HOSPITAL_COMMUNITY): Payer: Self-pay | Admitting: *Deleted

## 2011-11-03 ENCOUNTER — Emergency Department (HOSPITAL_COMMUNITY): Payer: Medicare Other

## 2011-11-03 ENCOUNTER — Inpatient Hospital Stay (HOSPITAL_COMMUNITY)
Admission: EM | Admit: 2011-11-03 | Discharge: 2011-11-05 | DRG: 309 | Disposition: A | Discharge status: Home / Self Care | Payer: Medicare Other | Attending: Internal Medicine | Admitting: Internal Medicine

## 2011-11-03 DIAGNOSIS — B1910 Unspecified viral hepatitis B without hepatic coma: Secondary | ICD-10-CM

## 2011-11-03 DIAGNOSIS — F1010 Alcohol abuse, uncomplicated: Secondary | ICD-10-CM

## 2011-11-03 DIAGNOSIS — I5022 Chronic systolic (congestive) heart failure: Secondary | ICD-10-CM

## 2011-11-03 DIAGNOSIS — F329 Major depressive disorder, single episode, unspecified: Secondary | ICD-10-CM

## 2011-11-03 DIAGNOSIS — R079 Chest pain, unspecified: Secondary | ICD-10-CM | POA: Diagnosis present

## 2011-11-03 DIAGNOSIS — Z66 Do not resuscitate: Secondary | ICD-10-CM | POA: Diagnosis present

## 2011-11-03 DIAGNOSIS — G4700 Insomnia, unspecified: Secondary | ICD-10-CM | POA: Diagnosis present

## 2011-11-03 DIAGNOSIS — I5020 Unspecified systolic (congestive) heart failure: Secondary | ICD-10-CM

## 2011-11-03 DIAGNOSIS — F3289 Other specified depressive episodes: Secondary | ICD-10-CM

## 2011-11-03 DIAGNOSIS — I428 Other cardiomyopathies: Secondary | ICD-10-CM | POA: Diagnosis present

## 2011-11-03 DIAGNOSIS — K649 Unspecified hemorrhoids: Secondary | ICD-10-CM

## 2011-11-03 DIAGNOSIS — I426 Alcoholic cardiomyopathy: Secondary | ICD-10-CM | POA: Diagnosis present

## 2011-11-03 DIAGNOSIS — Z7982 Long term (current) use of aspirin: Secondary | ICD-10-CM

## 2011-11-03 DIAGNOSIS — D696 Thrombocytopenia, unspecified: Secondary | ICD-10-CM

## 2011-11-03 DIAGNOSIS — I4891 Unspecified atrial fibrillation: Secondary | ICD-10-CM

## 2011-11-03 DIAGNOSIS — Z79899 Other long term (current) drug therapy: Secondary | ICD-10-CM

## 2011-11-03 DIAGNOSIS — F17200 Nicotine dependence, unspecified, uncomplicated: Secondary | ICD-10-CM

## 2011-11-03 LAB — CBC WITH DIFFERENTIAL/PLATELET
Basophils Relative: 0 % (ref 0–1)
Eosinophils Absolute: 0.2 10*3/uL (ref 0.0–0.7)
Eosinophils Relative: 2 % (ref 0–5)
Lymphs Abs: 4.3 10*3/uL — ABNORMAL HIGH (ref 0.7–4.0)
MCH: 31.7 pg (ref 26.0–34.0)
MCHC: 35.3 g/dL (ref 30.0–36.0)
MCV: 90 fL (ref 78.0–100.0)
Monocytes Relative: 8 % (ref 3–12)
Neutrophils Relative %: 42 % — ABNORMAL LOW (ref 43–77)
Platelets: 148 10*3/uL — ABNORMAL LOW (ref 150–400)
RBC: 4.6 MIL/uL (ref 4.22–5.81)

## 2011-11-03 LAB — URINALYSIS, ROUTINE W REFLEX MICROSCOPIC
Bilirubin Urine: NEGATIVE
Glucose, UA: NEGATIVE mg/dL
Hgb urine dipstick: NEGATIVE
Ketones, ur: NEGATIVE mg/dL
Leukocytes, UA: NEGATIVE
Nitrite: NEGATIVE
Protein, ur: NEGATIVE mg/dL
Specific Gravity, Urine: 1.01 (ref 1.005–1.030)
Urobilinogen, UA: 0.2 mg/dL (ref 0.0–1.0)
pH: 5.5 (ref 5.0–8.0)

## 2011-11-03 LAB — CARDIAC PANEL(CRET KIN+CKTOT+MB+TROPI)
CK, MB: 2.1 ng/mL (ref 0.3–4.0)
Relative Index: INVALID (ref 0.0–2.5)
Troponin I: <0.3 ng/mL (ref <0.30)

## 2011-11-03 LAB — COMPREHENSIVE METABOLIC PANEL
Albumin: 3.6 g/dL (ref 3.5–5.2)
BUN: 15 mg/dL (ref 6–23)
Calcium: 8.9 mg/dL (ref 8.4–10.5)
GFR calc Af Amer: >90 mL/min (ref >90)
Glucose, Bld: 86 mg/dL (ref 70–99)
Total Protein: 6.6 g/dL (ref 6.0–8.3)

## 2011-11-03 LAB — ETHANOL: Alcohol, Ethyl (B): 118 mg/dL — ABNORMAL HIGH (ref 0–11)

## 2011-11-03 LAB — PRO B NATRIURETIC PEPTIDE: Pro B Natriuretic peptide (BNP): 1294 pg/mL — ABNORMAL HIGH (ref 0–125)

## 2011-11-03 LAB — TROPONIN I: Troponin I: <0.3 ng/mL

## 2011-11-03 MED ORDER — SODIUM CHLORIDE 0.9 % IJ SOLN
3.0000 mL | Freq: Two times a day (BID) | INTRAMUSCULAR | Status: DC
  Administered 2011-11-04 – 2011-11-05 (×4): 3 mL via INTRAVENOUS

## 2011-11-03 MED ORDER — ADULT MULTIVITAMIN W/MINERALS CH
1.0000 | ORAL_TABLET | Freq: Every day | ORAL | Status: DC
  Administered 2011-11-03 – 2011-11-05 (×3): 1 via ORAL
  Filled 2011-11-03 (×3): qty 1

## 2011-11-03 MED ORDER — NITROGLYCERIN 0.4 MG SL SUBL: 0.4000 mg | SUBLINGUAL_TABLET | SUBLINGUAL | Status: DC | PRN

## 2011-11-03 MED ORDER — ZOLPIDEM TARTRATE 5 MG PO TABS
5.0000 mg | ORAL_TABLET | Freq: Every evening | ORAL | Status: DC | PRN
  Administered 2011-11-03 – 2011-11-04 (×2): 5 mg via ORAL
  Filled 2011-11-03 (×2): qty 1

## 2011-11-03 MED ORDER — DOCUSATE SODIUM 100 MG PO CAPS
100.0000 mg | ORAL_CAPSULE | Freq: Two times a day (BID) | ORAL | Status: DC
  Administered 2011-11-05: 100 mg via ORAL
  Filled 2011-11-03 (×5): qty 1

## 2011-11-03 MED ORDER — THIAMINE HCL 100 MG/ML IJ SOLN
100.0000 mg | Freq: Every day | INTRAMUSCULAR | Status: DC
  Filled 2011-11-03 (×2): qty 1

## 2011-11-03 MED ORDER — ONDANSETRON HCL 4 MG PO TABS: 4.0000 mg | ORAL_TABLET | Freq: Four times a day (QID) | ORAL | Status: DC | PRN

## 2011-11-03 MED ORDER — SODIUM CHLORIDE 0.9 % IJ SOLN: 3.0000 mL | INTRAMUSCULAR | Status: DC | PRN

## 2011-11-03 MED ORDER — METOPROLOL TARTRATE 50 MG PO TABS
50.0000 mg | ORAL_TABLET | Freq: Two times a day (BID) | ORAL | Status: DC
  Administered 2011-11-04: 50 mg via ORAL
  Filled 2011-11-03 (×2): qty 1

## 2011-11-03 MED ORDER — LORAZEPAM 2 MG/ML IJ SOLN: 1.0000 mg | Freq: Four times a day (QID) | INTRAMUSCULAR | Status: DC | PRN

## 2011-11-03 MED ORDER — SODIUM CHLORIDE 0.9 % IV SOLN: Freq: Once | INTRAVENOUS | Status: DC

## 2011-11-03 MED ORDER — SODIUM CHLORIDE 0.9 % IJ SOLN
3.0000 mL | Freq: Two times a day (BID) | INTRAMUSCULAR | Status: DC
  Administered 2011-11-04 (×2): 3 mL via INTRAVENOUS

## 2011-11-03 MED ORDER — DILTIAZEM HCL 100 MG IV SOLR
5.0000 mg/h | Freq: Once | INTRAVENOUS | Status: AC
  Administered 2011-11-03: 5 mg/h via INTRAVENOUS
  Filled 2011-11-03: qty 100

## 2011-11-03 MED ORDER — DILTIAZEM HCL 100 MG IV SOLR
5.0000 mg/h | INTRAVENOUS | Status: DC
  Filled 2011-11-03: qty 100

## 2011-11-03 MED ORDER — SODIUM CHLORIDE 0.9 % IV BOLUS (SEPSIS)
500.0000 mL | Freq: Once | INTRAVENOUS | Status: AC
  Administered 2011-11-04: 500 mL via INTRAVENOUS

## 2011-11-03 MED ORDER — DILTIAZEM HCL 30 MG PO TABS
30.0000 mg | ORAL_TABLET | Freq: Four times a day (QID) | ORAL | Status: DC
  Administered 2011-11-03 – 2011-11-04 (×4): 30 mg via ORAL
  Filled 2011-11-03 (×9): qty 1

## 2011-11-03 MED ORDER — FOLIC ACID 1 MG PO TABS
1.0000 mg | ORAL_TABLET | Freq: Every day | ORAL | Status: DC
  Administered 2011-11-03 – 2011-11-05 (×3): 1 mg via ORAL
  Filled 2011-11-03 (×3): qty 1

## 2011-11-03 MED ORDER — SODIUM CHLORIDE 0.9 % IV SOLN: 250.0000 mL | INTRAVENOUS | Status: DC | PRN

## 2011-11-03 MED ORDER — ONDANSETRON HCL 4 MG/2ML IJ SOLN: 4.0000 mg | Freq: Four times a day (QID) | INTRAMUSCULAR | Status: DC | PRN

## 2011-11-03 MED ORDER — LORAZEPAM 1 MG PO TABS
1.0000 mg | ORAL_TABLET | Freq: Four times a day (QID) | ORAL | Status: DC | PRN
  Administered 2011-11-03: 1 mg via ORAL
  Filled 2011-11-03: qty 1

## 2011-11-03 MED ORDER — SODIUM CHLORIDE 0.9 % IV BOLUS (SEPSIS)
500.0000 mL | Freq: Once | INTRAVENOUS | Status: AC
  Administered 2011-11-03: 500 mL via INTRAVENOUS

## 2011-11-03 MED ORDER — VITAMIN B-1 100 MG PO TABS
100.0000 mg | ORAL_TABLET | Freq: Every day | ORAL | Status: DC
  Administered 2011-11-03 – 2011-11-05 (×3): 100 mg via ORAL
  Filled 2011-11-03 (×3): qty 1

## 2011-11-03 MED ORDER — RIVAROXABAN 20 MG PO TABS
20.0000 mg | ORAL_TABLET | Freq: Every day | ORAL | Status: DC
  Administered 2011-11-03 – 2011-11-04 (×2): 20 mg via ORAL
  Filled 2011-11-03 (×3): qty 1

## 2011-11-03 NOTE — ED Notes (Signed)
Admitting MD contacted re: low BP and chest pressure of 6/10 (not new) and need for pain med &/or upgrade to SD.

## 2011-11-03 NOTE — ED Notes (Signed)
meds received from pharmacy, meds given, new bed assignment received, upgraded to SD, preparing to give report. VSS, BP low.

## 2011-11-03 NOTE — H&P (Signed)
[facility] History and Physical  [name] NFA:[id number] DOB: 1946/01/16 DOA: 11/03/2011  Referring physician: Dr. Manus Gunning PCP: Sees a Cardiologist at [facility] But has been followed by [facility] in the past.  Chief Complaint: SOB  HPI: [name] is a 66 y.o. male  Pt is a 66 y/o CM with h/o alcohol abuse and Afib on xarelto that presents to the hospital complaining of SOB.  He reports that this has been on going for the last 3 weeks but today got worse.  He has been seen by his cardiologist at [facility] who recently increased his B blocker medication and he was supposed to follow up with him tomorrow at his clinic.  But mentions that he got short of breath today and decided to come to the ED for further evaluation.  Nothing made it better or worse.  Problem was persistant and problem got worse today.  Not associated with any chest pain.  Problem is improved with supplemental oxygen and on cardizem gtt. Has not been associated with chest pain.  In the ED patient received cardizem gtt and per my discussion with ED physician his BP improved once his heart rate dropped from 140 to 67-92 while on the cardizem gtt.  Patients troponin was negative.  His sodium was 131 and BNP was 1294.  Reportedly patient had 4-5 beers today.  Review of Systems: The patient denies anorexia, fever, weight loss, vision loss, decreased hearing, hoarseness, chest pain, syncope, dyspnea on exertion, peripheral edema, balance deficits, hemoptysis, abdominal pain, melena, hematochezia, severe indigestion/heartburn, hematuria, incontinence, genital sores, muscle weakness, suspicious skin lesions, transient blindness, difficulty walking, depression, unusual weight change, abnormal bleeding, enlarged lymph nodes, angioedema, and breast masses.    Past Medical History  Diagnosis Date  . Atrial fibrillation   . Angina   . Shortness of breath   . Hepatitis     hepatitis B  . Palpitations   .  Hypertension   . 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 03/21/2011 - 2D Echo: EF 25% to 30%. Diffuse hypokinesis.  . 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 HEMORRHOIDS 01/17/2009  . DEPRESSION 01/17/2009  . ALCOHOL ABUSE 01/18/2009   Past Surgical History  Procedure Date  . Mouth surgery    Social History:  reports that he has been smoking Cigarettes.  He has been smoking about 1 pack per day. He does not have any smokeless tobacco history on file. He reports that he drinks about 10.8 ounces of alcohol per week. He reports that he does not use illicit drugs. Patient lives at home Yes patient can participate in his ADL's  No Known Allergies  No family history on file. Patient did not report any illnesses/diseases that run in the family.  Prior to Admission medications   Medication Sig Start Date End Date Taking? Authorizing Provider  aspirin EC 81 MG tablet Take 81 mg by mouth daily.    Yes Historical Provider, MD  Chlorphen-Phenyleph-ASA (ALKA-SELTZER PLUS COLD PO) Take 2 tablets by mouth daily as needed. For cold symptoms   Yes Historical Provider, MD  metoprolol (LOPRESSOR) 50 MG tablet Take 50 mg by mouth 2 (two) times daily.   Yes Historical Provider, MD  Rivaroxaban (XARELTO) 20 MG TABS Take 20 mg by mouth daily with supper.   Yes Historical Provider, MD   Physical Exam: Filed Vitals:   11/03/11 1716 11/03/11 1816 11/03/11 1901  BP: 110/60 105/66 103/68  Pulse: 78 67 92  Temp: 97.7 F (36.5 C)  97.3 F (36.3 C)  TempSrc: Oral  Oral  Resp: 20 18 15   SpO2: 97% 98% 96%     General:  Pt in NAD, A and O x 3  Eyes: EOMI, PERRLA  ENT: no masses on visual inspection, no rhinorrhea   Neck: supple, no goiter  Cardiovascular: irregularly irregular, no MRG  Respiratory: CTA BL, No wheezes  Abdomen: soft, NT, ND  Skin: no rashes, no diaphoresis  Musculoskeletal: No pain with movement, no cyanosis  Psychiatric: mood appropriate, flat  affect  Neurologic: patient answers questions appropriately, moves all extremities equally  Labs on Admission:  Basic Metabolic Panel:  Lab 11/03/11 0981  NA 131*  K 4.1  CL 98  CO2 19  GLUCOSE 86  BUN 15  CREATININE 0.97  CALCIUM 8.9  MG --  PHOS --   Liver Function Tests:  Lab 11/03/11 1713  AST 17  ALT 12  ALKPHOS 90  BILITOT 0.2*  PROT 6.6  ALBUMIN 3.6   No results found for this basename: LIPASE:5,AMYLASE:5 in the last 168 hours No results found for this basename: AMMONIA:5 in the last 168 hours CBC:  Lab 11/03/11 1713  WBC 8.9  NEUTROABS 3.7  HGB 14.6  HCT 41.4  MCV 90.0  PLT 148*   Cardiac Enzymes:  Lab 11/03/11 1713  CKTOTAL --  CKMB --  CKMBINDEX --  TROPONINI <0.30    BNP (last 3 results)  Basename 11/03/11 1713 03/21/11 0204  PROBNP 1294.0* 528.2*   CBG: No results found for this basename: GLUCAP:5 in the last 168 hours  Radiological Exams on Admission: Dg Chest Portable 1 View  11/03/2011  *RADIOLOGY REPORT*  Clinical Data: Atrial fibrillation, hypotension, shortness of breath  PORTABLE CHEST - 1 VIEW  Comparison: 03/20/2011  Findings: Chronic interstitial markings/emphysematous changes.  No pleural effusion or pneumothorax.  Borderline cardiomegaly.  IMPRESSION: No evidence of acute cardiopulmonary disease.  Chronic interstitial markings/emphysematous changes.  Borderline cardiomegaly.   Original Report Authenticated By: Charline Bills, M.D.     EKG: Independently reviewed. EKG in ED showed AFib no ST elevations or depressions  Assessment/Plan Active Problems: Afib with RVR Systolic HF Tobacco abuse Alcohol abuse Insomnia   1. Spoke with Cardiology [place].  Will add cardizem 30 mg po q 6 hours and will have nursing titrate the cardizem gtt down until off one hour later pending on response to oral cardizem.  Will place patient on telemetry.  Cycle cardiac enzymes.  Monitor vitals and make changes pending heart rate.  Otherwise  will continue B blocker.  Continue xeralto 2. Systolic HF: Continue home regimen. Fluid restrict patient to 2 L q day 3. Tobacco abuse: Place on nicotine patch 4. Alcohol Abuse: Place on [place] protocol 5. Insomnia:  Place on [place].  Code Status: DNR per discussion with patient Family Communication: none at bedside Disposition Plan: Pending clinical improvement.  May d/c in 1-2 days  Time spent: > 55 minutes  Penny Pia [facility] Pager [phone]  If 7PM-7AM, please contact night-coverage [email or web address] Password [facility] 11/03/2011, 7:22 PM

## 2011-11-03 NOTE — ED Notes (Signed)
Pt knows that urine is needed. Pt is unable to void at this time urinal is at pt bedside

## 2011-11-03 NOTE — ED Notes (Signed)
Rates chest pressure improved after ativan, rates 3/10 down from 6/10, evening meal finished 100%, asked for snack, happy meal given, waiting on meds from pharmacy.

## 2011-11-03 NOTE — ED Notes (Signed)
Meal tray given in addition to snack, urinal used at BS, standing "increases HR and feeling of fatigue", reports "chest pressure remains". Alert, NAD, calm, interactive, skin W&D, resps e/u, speaking in short sentences, VSS, afib HR 78, has taken O2 [place] off, mentions cold sx nasal congestion. 96% RA. Waiting on admitting MD, admission orders.

## 2011-11-03 NOTE — ED Notes (Signed)
Rapid a fib. 140's, cardioverted x 2 in the last 45 days; pcp increased lopressor; no cause for the rvr. Hypotensive in route but asymptomatic.

## 2011-11-03 NOTE — ED Notes (Signed)
Pt. Not on blood thinners; etoh on board.

## 2011-11-03 NOTE — ED Provider Notes (Signed)
History     CSN: [id number]  Arrival date & time 11/03/11  1700   First MD Initiated Contact with Patient 11/03/11 1712      Chief Complaint  Patient presents with  . Atrial Fibrillation  . 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 He was cardioverted twice in the past 4-5 days by his cardiologist in [place]. His Lopressor dose was increased. He states he's had constant chest pressure doesn't go away. Denies any shortness of breath, nausea vomiting. Denies any dizziness or lightheadedness or syncope. Has never had a heart attack is not have stents in his heart.  The history is provided by the patient and the EMS personnel.    Past Medical History  Diagnosis Date  . Atrial fibrillation   . Angina   . Shortness of breath   . Hepatitis     hepatitis B  . Palpitations   . Hypertension   . 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 03/21/2011 - 2D Echo: EF 25% to 30%. Diffuse hypokinesis.  . 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 HEMORRHOIDS 01/17/2009  . DEPRESSION 01/17/2009  . ALCOHOL ABUSE 01/18/2009    Past Surgical History  Procedure Date  . Mouth surgery     History reviewed. No pertinent family history.  History  Substance Use Topics  . Smoking status: Current Everyday Smoker -- 1.0 packs/day    Types: Cigarettes  . Smokeless tobacco: Not on file  . Alcohol Use: 10.8 oz/week    18 Cans of beer per week     heavy drinker      Review of Systems  Constitutional: Negative for activity change and appetite change.  HENT: Negative for congestion and rhinorrhea.   Respiratory: Positive for chest tightness. Negative for cough.   Cardiovascular: Positive for chest pain and palpitations.  Gastrointestinal:  Negative for nausea, vomiting and abdominal pain.  Genitourinary: Negative for dysuria.  Musculoskeletal: Negative for back pain.  Skin: Negative for wound.  Neurological: Negative for dizziness, weakness and headaches.    Allergies  Review of patient's allergies indicates no known allergies.  Home Medications   No current outpatient prescriptions on file.  BP 117/76  Pulse 64  Temp 97.8 F (36.6 C) (Oral)  Resp 18  Ht 6\' 6"  (1.981 m)  Wt 200 lb 6.4 oz (90.9 kg)  BMI 23.16 kg/m2  SpO2 94%  Physical Exam  Constitutional: He appears well-developed and well-nourished. No distress.       Normal mentation Smells of alcohol  HENT:  Head: Normocephalic and atraumatic.  Mouth/Throat: Oropharynx is clear and moist. No oropharyngeal exudate.  Eyes: Conjunctivae and EOM are normal. Pupils are equal, round, and reactive to light.  Neck: Normal range of motion. Neck supple.  Cardiovascular: Normal rate and normal heart sounds.        Tachycardic irregular rhythm  Pulmonary/Chest: Effort normal and breath sounds normal. No respiratory distress.  Abdominal: Soft. There is no tenderness. There is no rebound and no guarding.  Musculoskeletal: Normal range of motion. He exhibits no edema and no tenderness.       Equal DP, PT pulse  Neurological: He is alert. No cranial nerve deficit.  Skin: Skin is warm.    ED Course  Procedures (including critical care time)  Labs Reviewed  CBC WITH DIFFERENTIAL - Abnormal;  Notable for the following:    Platelets 148 (*)     Neutrophils Relative 42 (*)     Lymphocytes Relative 49 (*)     Lymphs Abs 4.3 (*)     All other components within normal limits  COMPREHENSIVE METABOLIC PANEL - Abnormal; Notable for the following:    Sodium 131 (*)     Total Bilirubin 0.2 (*)     GFR calc non Af Amer 85 (*)     All other components within normal limits  PRO B NATRIURETIC PEPTIDE - Abnormal; Notable for the following:    Pro B Natriuretic peptide (BNP)  1294.0 (*)     All other components within normal limits  ETHANOL - Abnormal; Notable for the following:    Alcohol, Ethyl (B) 118 (*)     All other components within normal limits  BASIC METABOLIC PANEL - Abnormal; Notable for the following:    GFR calc non Af Amer 73 (*)     GFR calc Af Amer 84 (*)     All other components within normal limits  CBC - Abnormal; Notable for the following:    Platelets 128 (*)     All other components within normal limits  TROPONIN I  URINALYSIS, ROUTINE W REFLEX MICROSCOPIC  CARDIAC PANEL(CRET KIN+CKTOT+MB+TROPI)  CARDIAC PANEL(CRET KIN+CKTOT+MB+TROPI)  MRSA PCR SCREENING  CARDIAC PANEL(CRET KIN+CKTOT+MB+TROPI)   Dg Chest Portable 1 View  11/03/2011  *RADIOLOGY REPORT*  Clinical Data: Atrial fibrillation, hypotension, shortness of breath  PORTABLE CHEST - 1 VIEW  Comparison: 03/20/2011  Findings: Chronic interstitial markings/emphysematous changes.  No pleural effusion or pneumothorax.  Borderline cardiomegaly.  IMPRESSION: No evidence of acute cardiopulmonary disease.  Chronic interstitial markings/emphysematous changes.  Borderline cardiomegaly.   Original Report Authenticated By: Charline Bills, M.D.      1. Atrial fibrillation with RVR   2. Chest pain   3. Alcohol abuse, unspecified   4. 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 Systolic heart failure   6. Tobacco use disorder       MDM  Atrial fibrillation with rapid ventricular response. Mentation appropriate, blood pressure appropriate. Hypotension in the field no asymptomatic.  Gentle IV hydration given poor ejection fraction, Cardizem drip, labs, chest x-ray.  HR improved on cardizem gtt to 70s-100s.  BP stable.   No shortness of breath. Cardiac enzymes negative. Admission d./w Dr. Cena Benton.   Date: 11/04/2011  Rate: 131  Rhythm: atrial fibrillation  QRS Axis: normal  Intervals: normal  ST/T Wave abnormalities: nonspecific ST/T changes  Conduction Disutrbances:none   Narrative Interpretation:   Old EKG Reviewed: unchanged    Glynn Octave, MD 11/04/11 1131

## 2011-11-04 ENCOUNTER — Encounter (HOSPITAL_COMMUNITY): Payer: Self-pay | Admitting: Physician Assistant

## 2011-11-04 DIAGNOSIS — R079 Chest pain, unspecified: Secondary | ICD-10-CM | POA: Diagnosis present

## 2011-11-04 DIAGNOSIS — I4891 Unspecified atrial fibrillation: Principal | ICD-10-CM

## 2011-11-04 DIAGNOSIS — I5022 Chronic systolic (congestive) heart failure: Secondary | ICD-10-CM

## 2011-11-04 DIAGNOSIS — I426 Alcoholic cardiomyopathy: Secondary | ICD-10-CM

## 2011-11-04 DIAGNOSIS — D696 Thrombocytopenia, unspecified: Secondary | ICD-10-CM | POA: Diagnosis present

## 2011-11-04 LAB — CARDIAC PANEL(CRET KIN+CKTOT+MB+TROPI)
CK, MB: 1.9 ng/mL (ref 0.3–4.0)
Relative Index: INVALID (ref 0.0–2.5)
Total CK: 55 U/L (ref 7–232)
Troponin I: <0.3 ng/mL (ref <0.30)

## 2011-11-04 LAB — BASIC METABOLIC PANEL
BUN: 19 mg/dL (ref 6–23)
Calcium: 8.8 mg/dL (ref 8.4–10.5)
Creatinine, Ser: 1.05 mg/dL (ref 0.50–1.35)
GFR calc non Af Amer: 73 mL/min — ABNORMAL LOW (ref >90)
Glucose, Bld: 93 mg/dL (ref 70–99)

## 2011-11-04 LAB — CBC
Hemoglobin: 13.5 g/dL (ref 13.0–17.0)
MCH: 31.7 pg (ref 26.0–34.0)
MCHC: 34.4 g/dL (ref 30.0–36.0)
Platelets: 128 10*3/uL — ABNORMAL LOW (ref 150–400)
RDW: 13.8 % (ref 11.5–15.5)

## 2011-11-04 LAB — MRSA PCR SCREENING: MRSA by PCR: NEGATIVE

## 2011-11-04 MED ORDER — METOPROLOL TARTRATE 50 MG PO TABS
75.0000 mg | ORAL_TABLET | Freq: Two times a day (BID) | ORAL | Status: DC
  Administered 2011-11-04 – 2011-11-05 (×2): 75 mg via ORAL
  Filled 2011-11-04 (×3): qty 1

## 2011-11-04 NOTE — Consult Note (Signed)
CARDIOLOGY CONSULT NOTE  Patient ID: [name], MRN: [id number], DOB/AGE: Sep 23, 1945 66 y.o. Admit date: 11/03/2011   Date of Consult: 11/04/2011 Primary Cardiologist: [place]. Also travels to [place] frequently. Dr. Jens Som is listed as his primary cardiologist here but has not been seen in the office before.  Chief Complaint: shortness of breath Reason for Consult: atrial fib  HPI: [name] is 66 y/o M with hx of atrial fibrillation, dilated cardiomyopathy, and alcohol abuse who presented with SOB and chest pressure associated with palpitations. We saw him in January 2013 and recommended rate control as well as anticoagulation with aspirin only. He reports recent cardioversion in [place] approximately 6 weeks ago when he went for SOB, elevated HR, & tingling fingers.  In the interim he has followed up in [place] and recently had his beta blocker increased with plans for followup in clinic there. He is also now on Xarelto. He reports trying to cut down on alcohol although he drank an 8-pack yesterday. He reports missing his medicines once per month due to money. He presented with worsening SOB. He also had chest pressure worsened when lying back. In the ED, the patient received cardizem gtt with improvement in his HR. He was transitioned to PO cardizem. His HR is down to 60s-70s with rare pause up to 2.1 sec. CE's are negative x2. H/H normal. Plt 128. EtOH 118. pBNP 1294. He feels much better now that his rate is controlled and is without chest pain or SOB.  He denies any falls or bleeding problems including BRBPR, melena or hematemesis.    Past Medical History  Diagnosis Date  . Atrial fibrillation   . Angina   . Hepatitis     hepatitis B  . Hypertension   . 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 03/21/2011 - 2D Echo: EF 25% to 30%. Diffuse hypokinesis.  Marland Kitchen HEMORRHOIDS 01/17/2009  . DEPRESSION 01/17/2009  . ALCOHOL ABUSE 01/18/2009      Most Recent Cardiac  Studies: 2D Echo 03/2011 Study Conclusions - Left ventricle: The cavity size was normal. Wall thickness was increased in a pattern of mild LVH. Systolic function was severely reduced. The estimated ejection fraction was in the range of 25% to 30%. Diffuse hypokinesis. - Left atrium: The atrium was mildly dilated. - Right ventricle: The cavity size was moderately dilated. - Right atrium: The atrium was moderately dilated.   Surgical History:  Past Surgical History  Procedure Date  . Mouth surgery      Home Meds: Prior to Admission medications   Medication Sig Start Date End Date Taking? Authorizing Provider  aspirin EC 81 MG tablet Take 81 mg by mouth daily.    Yes Historical Provider, MD  Chlorphen-Phenyleph-ASA (ALKA-SELTZER PLUS COLD PO) Take 2 tablets by mouth daily as needed. For cold symptoms   Yes Historical Provider, MD  metoprolol (LOPRESSOR) 50 MG tablet Take 50 mg by mouth 2 (two) times daily.   Yes Historical Provider, MD  Rivaroxaban (XARELTO) 20 MG TABS Take 20 mg by mouth daily with supper.   Yes Historical Provider, MD    Inpatient Medications:     . sodium chloride   Intravenous Once  . diltiazem (CARDIZEM) infusion  5 mg/hr Intravenous Once  . diltiazem  30 mg Oral Q6H  . docusate sodium  100 mg Oral BID  . folic acid  1 mg Oral Daily  . metoprolol  50 mg Oral BID  . multivitamin with minerals  1  tablet Oral Daily  . Rivaroxaban  20 mg Oral Q supper  . sodium chloride  500 mL Intravenous Once  . sodium chloride  500 mL Intravenous Once  . sodium chloride  500 mL Intravenous Once  . sodium chloride  3 mL Intravenous Q12H  . sodium chloride  3 mL Intravenous Q12H  . thiamine  100 mg Oral Daily   Or  . thiamine  100 mg Intravenous Daily    Allergies: No Known Allergies  History   Social History  . Marital Status: Divorced    Spouse Name: N/A    Number of Children: N/A  . Years of Education: N/A   Occupational History  . Not on file.   Social  History Main Topics  . Smoking status: Current Everyday Smoker -- 1.0 packs/day    Types: Cigarettes  . Smokeless tobacco: Not on file  . Alcohol Use: 10.8 oz/week    18 Cans of beer per week     heavy drinker  . Drug Use: No  . Sexually Active: No   Other Topics Concern  . Not on file   Social History Narrative  . No narrative on file     Family History  Problem Relation Age of Onset  . Heart disease Neg Hx      Review of Systems: General: negative for chills, fever, night sweats or weight changes.  Cardiovascular: see above Dermatological: negative for rash Respiratory: negative for cough or wheezing Urologic: negative for hematuria Abdominal: negative for nausea, vomiting, diarrhea, bright red blood per rectum, melena, or hematemesis Neurologic: negative for visual changes, syncope, or dizziness All other systems reviewed and are otherwise negative except as noted above.  Labs:  [facility] 11/04/11 0434 11/03/11 2102 11/03/11 1713  CKTOTAL 55 61 --  CKMB 1.9 2.1 --  TROPONINI <0.30 <0.30 <0.30   Lab Results  Component Value Date   WBC 6.4 11/04/2011   HGB 13.5 11/04/2011   HCT 39.3 11/04/2011   MCV 92.3 11/04/2011   PLT 128* 11/04/2011     Lab 11/04/11 0500 11/03/11 1713  NA 137 --  K 4.3 --  CL 104 --  CO2 25 --  BUN 19 --  CREATININE 1.05 --  CALCIUM 8.8 --  PROT -- 6.6  BILITOT -- 0.2*  ALKPHOS -- 90  ALT -- 12  AST -- 17  GLUCOSE 93 --   Lab Results  Component Value Date   CHOL 129 01/04/2011   HDL 41 01/04/2011   LDLCALC 71 01/04/2011   TRIG 86 01/04/2011    Radiology/Studies:  1. Chest Portable 1 View 11/03/2011  *RADIOLOGY REPORT*  Clinical Data: Atrial fibrillation, hypotension, shortness of breath  PORTABLE CHEST - 1 VIEW  Comparison: 03/20/2011  Findings: Chronic interstitial markings/emphysematous changes.  No pleural effusion or pneumothorax.  Borderline cardiomegaly.  IMPRESSION: No evidence of acute cardiopulmonary disease.  Chronic  interstitial markings/emphysematous changes.  Borderline cardiomegaly.   Original Report Authenticated By: Charline Bills, M.D.    EKG: atrial fib 131 bpm no acute changes, QTc 514  Physical Exam: Blood pressure 102/64, pulse 72, temperature 98.3 F (36.8 C), temperature source Oral, resp. rate 24, height 6\' 6"  (1.981 m), weight 200 lb 6.4 oz (90.9 kg), SpO2 98.00%. General: Well developed, well nourished, in no acute distress. Head: Normocephalic, atraumatic, sclera non-icteric, no xanthomas, nares are without discharge.  Neck: Negative for carotid bruits. JVD not elevated. Lungs: Clear bilaterally to auscultation without wheezes, rales, or rhonchi. Breathing is unlabored. Heart:  Irregular with normal S1 S2. No murmurs, rubs, or gallops appreciated. Abdomen: Soft, non-tender, non-distended with normoactive bowel sounds. No hepatomegaly. No rebound/guarding. No obvious abdominal masses. Msk:  Strength and tone appear normal for age. Extremities: No clubbing or cyanosis. No edema.  Distal pedal pulses are 2+ and equal bilaterally. Neuro: Alert and oriented X 3. Moves all extremities spontaneously. Psych:  Responds to questions appropriately with a normal affect.   Assessment and Plan:   1. Atrial fibrillation 2. EtOH abuse 3. Dilated cardiomyopathy with EF 25-30%  Signed, Dayna Dunn PA-C 11/04/2011, 12:28 PM   As above, patient seen and examined. Briefly 66 year old male with past medical history of atrial fibrillation, dilated cardiomyopathy and alcohol abuse for evaluation of atrial fibrillation. Last echocardiogram in January of 2013 showed an ejection fraction of 25-30%, mild left atrial enlargement and moderate right-sided enlargement. The patient has had problems with alcoholism and noncompliance. He apparently was in [place] 6 weeks ago and underwent cardioversion. I do not have those records available. He states his atrial fibrillation recurred 2 weeks ago. He presented to the  emergency room last evening with complaints of weakness, palpitations and chest heaviness that increased with lying flat. His heart rate was elevated at approximately 140. He has been treated with rate control with improvement in his symptoms. His alcohol level was 118. Patient's main issue is alcoholism. I had extensive discussions with him concerning this. His atrial fibrillation rate is much better. We will continue with metoprolol but increase to 75 mg by mouth twice a day. This would be good for his rate control and also his cardiomyopathy. Discontinue Cardizem. I do not think his blood pressure will allow and ACE inhibitor at this point but we will add later as tolerated. Given his recent cardioversion I will continue xeralto although this is very concerning given his history of alcohol abuse. At discharge we will provide a 30 day prescription only. We will arrange followup in our [place] office. He will not be able to renew this prescription unless he comes for his office visits and demonstrates compliance with followup medications. I have explained to him that if he discontinues his alcohol abuse his cardiomyopathy may improve. I have explained the risk of stroke associated with atrial fibrillation off of anticoagulation. His long-term prognosis is poor unless his alcoholism is corrected. [name] 12:33 PM

## 2011-11-04 NOTE — Care Management Note (Signed)
    Page 1 of 1   11/04/2011     12:38:28 PM   CARE MANAGEMENT NOTE 11/04/2011  Patient:  [name], [name]   Account Number:  [id number]  Date Initiated:  11/04/2011  Documentation initiated by:  Junius Creamer  Subjective/Objective Assessment:   adm w at fib w rvr     Action/Plan:   lives at homeless shelter in [place], [place] for etoh abuse   Anticipated DC Date:     Anticipated DC Plan:    In-house referral  Clinical Social Worker      DC Associate Professor  CM consult      Choice offered to / List presented to:             Status of service:   Medicare Important Message given?   (If response is "NO", the following Medicare IM given date fields will be blank) Date Medicare IM given:   Date Additional Medicare IM given:    Discharge Disposition:    Per UR Regulation:  Reviewed for med. necessity/level of care/duration of stay  If discussed at Long Length of Stay Meetings, dates discussed:    Comments:  8/26 12:37p debbie Ngozi Alvidrez rn,bsn [phone]

## 2011-11-04 NOTE — Progress Notes (Signed)
[facility] Progress Note [place] TEAM 1 - Stepdown/ICU TEAM   HALSEY HAMMEN JWJ:[id number] DOB: 1945-08-07 DOA: 11/03/2011 PCP: Sheila Oats, MD  Brief narrative: 66 year old male patient with history of chronic atrial fibrillation in the setting of dilated cardiomyopathy and alcohol abuse. He presented with complaints of shortness of breath, chest pressure and palpitations. He has recently been followed by his cardiologist in [place] with increase in beta blocker dosage cause of recurrent tachypalpitations in nature fibrillation with RVR. He also recently has been trying to cut down on his alcohol intake. For anticoagulation he takes Xarelto. He tells [place] about 6 weeks ago while working down at the [employer] he underwent cardioversion at [facility]. Because of need for IV Cardizem for rate control he was admitted to the step down unit  Assessment/Plan:  Atrial fibrillation with RVR *Has had recent issues as an outpatient with poor rate control and did not respond to increase beta blocker dosage recently *So far is rate controlled and IV Cardizem has been weaned off *Cardiology prefers to discontinue Cardizem in favor of higher beta blocker dosing since plans are to add ACE inhibitor later on to treat patient's heart failure *Unclear if patient would be candidate for antiarrhythmic-may be limited by patient's underlying liver dysfunction related to hepatitis and alcohol abuse  ALCOHOL ABUSE *Blood alcohol level 114 at presentation *Continue CIWA protocol *No signs of alcohol withdrawal at this stage  Chest pain *Cardiac enzymes have been negative and EKG was without ischemic changes *Suspect chest pain was primarily mediated by rapid ventricular response  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%/Chronic systolic heart failure *Cardiology feels patient would benefit from eventual ACE inhibitor therapy and as noted above they do not  feel his blood pressure would tolerate beta blocker ACE inhibitor and calcium channel blocker *Currently appears to be compensated without evidence of failure on clinical exam despite recurrent rapid ventricular response  HEPATITIS B  TOBACCO ABUSE  Thrombocytopenia *Slight downward trend since admission *Suspect primary etiology related to underlying liver disease from alcoholism and hepatitis  DVT prophylaxis: SCD's Code Status: Full code Family Communication: Spoke directly with patient Disposition Plan: Transfer to telemetry  Consultants: Cardiology/Dr. Jens Som  Procedures: None  Antibiotics: None  HPI/Subjective: Patient is awake and endorses generalized fatigue. States no further palpitations or chest pain. Also denies shortness of breath or orthopnea. History regarding his atrial fibrillation and recent treatment clarified   Objective: Blood pressure 102/64, pulse 72, temperature 98.3 F (36.8 C), temperature source Oral, resp. rate 24, height 6\' 6"  (1.981 m), weight 90.9 kg (200 lb 6.4 oz), SpO2 98.00%.  Intake/Output Summary (Last 24 hours) at 11/04/11 1342 Last data filed at 11/04/11 1200  Gross per 24 hour  Intake   1445 ml  Output   2900 ml  Net  -1455 ml     Exam: General: No acute respiratory distress Lungs: Clear to auscultation bilaterally without wheezes or crackles Cardiovascular: Iregular rate and rhythm without murmur gallop or rub normal S1 and S2, atrial fibrillation, no JVD or peripheral edema Abdomen: Nontender, nondistended, soft, bowel sounds positive, no rebound, no ascites, no appreciable mass Musculoskeletal: No significant cyanosis, clubbing of extremities Neurological: Patient is alert, oriented, no tremors, no asterixis, moves all extremities x4, exam non and focal  Data Reviewed: Basic Metabolic Panel:  Lab 11/04/11 6213 11/03/11 1713  NA 137 131*  K 4.3 4.1  CL 104 98  CO2 25 19  GLUCOSE 93 86  BUN 19 15  CREATININE 1.05  0.97  CALCIUM 8.8 8.9  MG -- --  PHOS -- --   Liver Function Tests:  Lab 11/03/11 1713  AST 17  ALT 12  ALKPHOS 90  BILITOT 0.2*  PROT 6.6  ALBUMIN 3.6   CBC:  Lab 11/04/11 0500 11/03/11 1713  WBC 6.4 8.9  NEUTROABS -- 3.7  HGB 13.5 14.6  HCT 39.3 41.4  MCV 92.3 90.0  PLT 128* 148*   Cardiac Enzymes:  Lab 11/04/11 1140 11/04/11 0434 11/03/11 2102 11/03/11 1713  CKTOTAL 59 55 61 --  CKMB 1.8 1.9 2.1 --  CKMBINDEX -- -- -- --  TROPONINI <0.30 <0.30 <0.30 <0.30   BNP (last 3 results)  Basename 11/03/11 1713 03/21/11 0204  PROBNP 1294.0* 528.2*    Recent Results (from the past 240 hour(s))  MRSA PCR SCREENING     Status: Normal   Collection Time   11/03/11 11:28 PM      Component Value Range Status Comment   MRSA by PCR NEGATIVE  NEGATIVE Final      Studies:  Recent x-ray studies have been reviewed in detail by the Attending Physician  Scheduled Meds:  Reviewed in detail by the Attending Physician   Junious Silk, ANP [facility] Office  [phone] Pager [phone]  On-Call/Text Page:      [email or web address]      password [email or web address]  If 7PM-7AM, please contact night-coverage [email or web address] Password [email or web address] 11/04/2011, 1:42 PM   LOS: 1 day   I have personally examined this patient and reviewed the entire database. I have reviewed the above note, made any necessary editorial changes, and agree with its content.  Lonia Blood, MD [facility]

## 2011-11-05 DIAGNOSIS — F1010 Alcohol abuse, uncomplicated: Secondary | ICD-10-CM

## 2011-11-05 DIAGNOSIS — I5020 Unspecified systolic (congestive) heart failure: Secondary | ICD-10-CM

## 2011-11-05 LAB — GLUCOSE, CAPILLARY: Glucose-Capillary: 93 mg/dL (ref 70–99)

## 2011-11-05 MED ORDER — ADULT MULTIVITAMIN W/MINERALS CH: 1.0000 | ORAL_TABLET | Freq: Every day | ORAL | Status: DC

## 2011-11-05 MED ORDER — FOLIC ACID 1 MG PO TABS: 1.0000 mg | ORAL_TABLET | Freq: Every day | ORAL | Status: DC

## 2011-11-05 MED ORDER — METOPROLOL TARTRATE 25 MG PO TABS: 75.0000 mg | ORAL_TABLET | Freq: Two times a day (BID) | ORAL | Status: DC

## 2011-11-05 MED ORDER — RIVAROXABAN 20 MG PO TABS: 20.0000 mg | ORAL_TABLET | Freq: Every day | ORAL | Status: DC

## 2011-11-05 MED ORDER — DSS 100 MG PO CAPS: 100.0000 mg | ORAL_CAPSULE | Freq: Two times a day (BID) | ORAL | Status: AC

## 2011-11-05 MED ORDER — THIAMINE HCL 100 MG PO TABS: 100.0000 mg | ORAL_TABLET | Freq: Every day | ORAL | Status: DC

## 2011-11-05 NOTE — Discharge Summary (Signed)
Physician Discharge Summary  [name] ZOX:[id number] DOB: Oct 29, 1945 DOA: 11/03/2011  PCP: Sheila Oats, MD  Admit date: 11/03/2011 Discharge date: 11/05/2011  Recommendations for Outpatient Follow-up:  1. Followup with [facility] as indicated below. 2. Instructed the patient to find PCP to establish care.  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  Filed Weights   11/03/11 2332  Weight: 90.9 kg (200 lb 6.4 oz)    History of present illness:  66 year old male patient with history of chronic atrial fibrillation in the setting of dilated cardiomyopathy and alcohol abuse. He presented with complaints of shortness of breath, chest pressure and palpitations on 11/03/2011. He has recently been followed by his cardiologist in [place] with increase in beta blocker dosage cause of recurrent tachypalpitations in nature fibrillation with RVR. He also recently has been trying to cut down on his alcohol intake. For anticoagulation he takes Xarelto. He tells [place] about 6 weeks ago while working down at the [employer] he underwent cardioversion at [facility]. Because of need for IV Cardizem for rate control he was admitted to the step down unit.  Hospital Course:  Atrial fibrillation with RVR  Rate better controlled today on metoprolol. Continue Rivaroxaban for anticoagulation. Has had recent issues as an outpatient with poor rate control and did not respond to increase beta blocker dosage recently. Cardiology prefered to discontinue Cardizem in favor of higher beta blocker dosing since plans are to add ACE inhibitor later on to treat patient's heart failure. Patient given only 10 day  supply of Rivaroxaban at discharge to demonstrate compliance given alcohol abuse. Appointment made with [facility] at [place], [place]. Patient was instructed to go to [place] [facility] office to pickup sample of Rivaroxaban.  ALCOHOL ABUSE  Blood alcohol level 114 at presentation. No signs of alcohol withdrawal at this stage. Was on CIWA protocol. Patient declined social worker help in finding resources for alcohol cessation.  Chest pain  Cardiac enzymes have been negative and EKG was without ischemic changes. Chest pain likely due to rapid ventricular response.  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 Can be started as outpatient after patient followup with [facility] office in [place], [place]. Currently appears to be compensated without evidence of failure on clinical exam despite recurrent rapid ventricular response .  HEPATITIS B  Management as outpatient.  TOBACCO ABUSE  Discussed with the patient about cessation.  Thrombocytopenia  Slight downward trend since admission. Likely due to alcohol and hepatitis.   Procedures:  As above  Consultations:  [facility], Dr. Johney Frame.  Discharge Exam: Filed Vitals:   11/05/11 1042  BP: 122/82  Pulse: 87  Temp:   Resp:    Filed Vitals:   11/04/11 2224 11/05/11 0200 11/05/11 0500 11/05/11 1042  BP: 126/90 120/88 124/85 122/82  Pulse: 88 78 66 87  Temp:   97.6 F (36.4 C)   TempSrc:   Oral   Resp:   18   Height:      Weight:      SpO2:   98%     Discharge Instructions  Discharge Orders    Future Appointments:  Provider: Department: Dept Phone: Center:   11/22/2011 9:00 AM Iran Ouch, MD [facility] [phone] [facility]     Future Orders Please Complete By Expires   Diet - low sodium heart healthy      Increase activity slowly      Discharge  instructions      Comments:   Do not drink alcohol. Followup with [facility] in [place], [place]. Please find a primary care physician to establish care.     Medication List  As of 11/05/2011 11:19 AM   STOP taking these medications         aspirin EC 81 MG tablet         TAKE these medications         ALKA-SELTZER PLUS COLD PO   Take 2 tablets by mouth daily as needed. For cold symptoms      DSS 100 MG Caps   Take 100 mg by mouth 2 (two) times daily. For constipation.      folic acid 1 MG tablet   Commonly known as: FOLVITE   Take 1 tablet (1 mg total) by mouth daily.      metoprolol tartrate 25 MG tablet   Commonly known as: LOPRESSOR   Take 3 tablets (75 mg total) by mouth 2 (two) times daily.      multivitamin with minerals Tabs   Take 1 tablet by mouth daily.      Rivaroxaban 20 MG Tabs   Commonly known as: XARELTO   Take 1 tablet (20 mg total) by mouth daily with supper.      thiamine 100 MG tablet   Take 1 tablet (100 mg total) by mouth daily.           Follow-up Information    Follow up with Lorine Bears, MD on 11/22/2011. (At 9:00 AM)    Contact information:   [address] [place] [address] [phone]           The results of significant diagnostics from this hospitalization (including imaging, microbiology, ancillary and laboratory) are listed below for reference.    Significant Diagnostic Studies: Dg Chest Portable 1 View  11/03/2011  *RADIOLOGY REPORT*  Clinical Data: Atrial fibrillation, hypotension, shortness of breath  PORTABLE CHEST - 1 VIEW  Comparison: 03/20/2011  Findings: Chronic interstitial markings/emphysematous changes.  No pleural effusion or pneumothorax.  Borderline cardiomegaly.  IMPRESSION: No evidence of acute cardiopulmonary disease.  Chronic interstitial markings/emphysematous changes.  Borderline cardiomegaly.   Original Report Authenticated By: Charline Bills, M.D.    Echocardiogram  January 2013  Conclusions:  - Left ventricle: The cavity size was normal. Wall thickness was increased in a pattern of mild LVH. Systolic function was severely reduced. The estimated ejection fraction was in the range of 25% to 30%. Diffuse hypokinesis. - Left atrium: The atrium was mildly dilated. 38 mm. - Right ventricle: The cavity size was moderately dilated. - Right atrium: The atrium was moderately dilated.  Microbiology: Recent Results (from the past 240 hour(s))  MRSA PCR SCREENING     Status: Normal   Collection Time   11/03/11 11:28 PM      Component Value Range Status Comment   MRSA by PCR NEGATIVE  NEGATIVE Final      Labs: Basic Metabolic Panel:  Lab 11/04/11 6578 11/03/11 1713  NA 137 131*  K 4.3 4.1  CL 104 98  CO2 25 19  GLUCOSE 93 86  BUN 19 15  CREATININE 1.05  0.97  CALCIUM 8.8 8.9  MG -- --  PHOS -- --   Liver Function Tests:  Lab 11/03/11 1713  AST 17  ALT 12  ALKPHOS 90  BILITOT 0.2*  PROT 6.6  ALBUMIN 3.6   No results found for this basename: LIPASE:5,AMYLASE:5 in the last 168 hours No results found for this basename: AMMONIA:5 in the last 168 hours CBC:  Lab 11/04/11 0500 11/03/11 1713  WBC 6.4 8.9  NEUTROABS -- 3.7  HGB 13.5 14.6  HCT 39.3 41.4  MCV 92.3 90.0  PLT 128* 148*   Cardiac Enzymes:  Lab 11/04/11 1140 11/04/11 0434 11/03/11 2102 11/03/11 1713  CKTOTAL 59 55 61 --  CKMB 1.8 1.9 2.1 --  CKMBINDEX -- -- -- --  TROPONINI <0.30 <0.30 <0.30 <0.30   BNP: BNP (last 3 results)  Basename 11/03/11 1713 03/21/11 0204  PROBNP 1294.0* 528.2*   CBG:  Lab 11/05/11 0813  GLUCAP 93    Time coordinating discharge: 25 minutes  Signed:  Saki Legore A  [facility] 11/05/2011, 11:19 AM

## 2011-11-05 NOTE — Progress Notes (Signed)
Subjective: Feeling better today. Wondering if he can be discharged home today.  Objective: Vital signs in last 24 hours: Filed Vitals:   11/04/11 2224 11/05/11 0200 11/05/11 0500 11/05/11 1042  BP: 126/90 120/88 124/85 122/82  Pulse: 88 78 66 87  Temp:   97.6 F (36.4 C)   TempSrc:   Oral   Resp:   18   Height:      Weight:      SpO2:   98%    Weight change:   Intake/Output Summary (Last 24 hours) at 11/05/11 1116 Last data filed at 11/05/11 1046  Gross per 24 hour  Intake    606 ml  Output    500 ml  Net    106 ml    Physical Exam: General: Awake, Oriented, No acute distress. HEENT: EOMI. Neck: Supple CV: S1 and S2 Lungs: Clear to ascultation bilaterally Abdomen: Soft, Nontender, Nondistended, +bowel sounds. Ext: Good pulses. Trace edema.  Lab Results: Basic Metabolic Panel:  Lab 11/04/11 1191 11/03/11 1713  NA 137 131*  K 4.3 4.1  CL 104 98  CO2 25 19  GLUCOSE 93 86  BUN 19 15  CREATININE 1.05 0.97  CALCIUM 8.8 8.9  MG -- --  PHOS -- --   Liver Function Tests:  Lab 11/03/11 1713  AST 17  ALT 12  ALKPHOS 90  BILITOT 0.2*  PROT 6.6  ALBUMIN 3.6   No results found for this basename: LIPASE:5,AMYLASE:5 in the last 168 hours No results found for this basename: AMMONIA:5 in the last 168 hours CBC:  Lab 11/04/11 0500 11/03/11 1713  WBC 6.4 8.9  NEUTROABS -- 3.7  HGB 13.5 14.6  HCT 39.3 41.4  MCV 92.3 90.0  PLT 128* 148*   Cardiac Enzymes:  Lab 11/04/11 1140 11/04/11 0434 11/03/11 2102 11/03/11 1713  CKTOTAL 59 55 61 --  CKMB 1.8 1.9 2.1 --  CKMBINDEX -- -- -- --  TROPONINI <0.30 <0.30 <0.30 <0.30   BNP (last 3 results)  Basename 11/03/11 1713 03/21/11 0204  PROBNP 1294.0* 528.2*   CBG:  Lab 11/05/11 0813  GLUCAP 93   No results found for this basename: HGBA1C:5 in the last 72 hours Other Labs: No components found with this basename: POCBNP:3 No results found for this basename: DDIMER:2 in the last 168 hours No results found  for this basename: CHOL:2,HDL:2,LDLCALC:2,TRIG:2,CHOLHDL:2,LDLDIRECT:2 in the last 168 hours No results found for this basename: TSH,T4TOTAL,FREET3,T3FREE,FREET4,THYROIDAB in the last 168 hours No results found for this basename: VITAMINB12:2,FOLATE:2,FERRITIN:2,TIBC:2,IRON:2,RETICCTPCT:2 in the last 168 hours  Micro Results: Recent Results (from the past 240 hour(s))  MRSA PCR SCREENING     Status: Normal   Collection Time   11/03/11 11:28 PM      Component Value Range Status Comment   MRSA by PCR NEGATIVE  NEGATIVE Final     Studies/Results: Dg Chest Portable 1 View  11/03/2011  *RADIOLOGY REPORT*  Clinical Data: Atrial fibrillation, hypotension, shortness of breath  PORTABLE CHEST - 1 VIEW  Comparison: 03/20/2011  Findings: Chronic interstitial markings/emphysematous changes.  No pleural effusion or pneumothorax.  Borderline cardiomegaly.  IMPRESSION: No evidence of acute cardiopulmonary disease.  Chronic interstitial markings/emphysematous changes.  Borderline cardiomegaly.   Original Report Authenticated By: Charline Bills, M.D.     Medications: I have reviewed the patient's current medications. Scheduled Meds:   . docusate sodium  100 mg Oral BID  . folic acid  1 mg Oral Daily  . metoprolol  75 mg Oral BID  .  multivitamin with minerals  1 tablet Oral Daily  . Rivaroxaban  20 mg Oral Q supper  . sodium chloride  3 mL Intravenous Q12H  . thiamine  100 mg Oral Daily   Or  . thiamine  100 mg Intravenous Daily  . DISCONTD: sodium chloride   Intravenous Once  . DISCONTD: diltiazem  30 mg Oral Q6H  . DISCONTD: metoprolol  50 mg Oral BID  . DISCONTD: sodium chloride  3 mL Intravenous Q12H   Continuous Infusions:   . DISCONTD: diltiazem (CARDIZEM) infusion Stopped (11/04/11 0000)   PRN Meds:.LORazepam, LORazepam, nitroGLYCERIN, ondansetron (ZOFRAN) IV, ondansetron, zolpidem, DISCONTD: sodium chloride, DISCONTD: sodium chloride  Assessment/Plan: Atrial fibrillation with RVR    Rate better controlled today on metoprolol. Continue Rivaroxaban for anticoagulation. Has had recent issues as an outpatient with poor rate control and did not respond to increase beta blocker dosage recently. Cardiology prefers to discontinue Cardizem in favor of higher beta blocker dosing since plans are to add ACE inhibitor later on to treat patient's heart failure. Patient given only 10 day supply of Rivaroxaban at discharge to demonstrate compliance given alcohol abuse. Appointment made with [facility] at [place], [place]. Patient was instructed to go to [place] [facility] office to pickup sample of Rivaroxaban.  ALCOHOL ABUSE  Blood alcohol level 114 at presentation. No signs of alcohol withdrawal at this stage. Was on CIWA protocol. Patient declined social worker help in finding resources for alcohol cessation.  Chest pain  Cardiac enzymes have been negative and EKG was without ischemic changes. Chest pain likely due to rapid ventricular response.  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 Can be started as outpatient after patient followup with [facility] office in [place], [place]. Currently appears to be compensated without evidence of failure on clinical exam despite recurrent rapid ventricular response .  HEPATITIS B  Management as outpatient.  TOBACCO ABUSE  Discussed with the patient about cessation.  Thrombocytopenia  Slight downward trend since admission. Likely due to alcohol and hepatitis.   DVT prophylaxis: Rivaroxaban. Code Status: Full code  Family Communication: Spoke directly with patient  Disposition Plan: Discharge patient today.   LOS: 2 days  Cecelia Graciano A, MD 11/05/2011, 11:16 AM

## 2011-11-05 NOTE — Progress Notes (Signed)
Patient: [name] Date of Encounter: 11/05/2011, 9:51 AM Admit date: 11/03/2011     Subjective  Briefly, Mr. Mergen is a 66 year old man with atrial fibrillation in the setting of dilated cardiomyopathy and alcohol abuse. He presented with complaints of shortness of breath, chest pressure and palpitations. He has recently been followed by his cardiologist in [place] with increase in beta blocker dosage because of recurrent tachypalpitations due to atrial fibrillation with RVR. He also recently has been trying to cut down on his alcohol intake. For anticoagulation he takes Xarelto. He tells [place] about 6 weeks ago while working down at the [employer] he underwent cardioversion at [facility].   This AM, he reports he is feeling better now that his heart is not racing. He denies CP, SOB or palpitations.   Objective  Physical Exam: Vitals: BP 124/85  Pulse 66  Temp 97.6 F (36.4 C) (Oral)  Resp 18  Ht 6\' 6"  (1.981 m)  Wt 200 lb 6.4 oz (90.9 kg)  BMI 23.16 kg/m2  SpO2 98% General: Well developed, well appearing 66 year old male in no acute distress. Neck: Supple. JVD not elevated. Lungs: Clear bilaterally to auscultation without wheezes, rales, or rhonchi. Breathing is unlabored. Heart: Irregularly irregular S1 S2 without murmurs, rub or gallop.  Abdomen: Soft, non-distended. Extremities: No clubbing or cyanosis. No edema.  Distal pedal pulses are 2+ and equal bilaterally. Neuro: Alert and oriented X 3. Moves all extremities spontaneously. No focal deficits.  Intake/Output: Intake/Output Summary (Last 24 hours) at 11/05/11 0951 Last data filed at 11/05/11 0800  Gross per 24 hour  Intake    603 ml  Output    500 ml  Net    103 ml   Inpatient Medications:  . docusate sodium  100 mg Oral BID  . folic acid  1 mg Oral Daily  . metoprolol  75 mg Oral BID  . multivitamin with minerals  1 tablet Oral Daily  . Rivaroxaban  20 mg Oral Q supper  .  sodium chloride  3 mL Intravenous Q12H  . thiamine  100 mg Oral Daily   Or  . thiamine  100 mg Intravenous Daily  . DISCONTD: sodium chloride   Intravenous Once  . DISCONTD: diltiazem  30 mg Oral Q6H  . DISCONTD: metoprolol  50 mg Oral BID  . DISCONTD: sodium chloride  3 mL Intravenous Q12H   . DISCONTD: diltiazem (CARDIZEM) infusion Stopped (11/04/11 0000)   Labs:  Basename 11/04/11 0500 11/03/11 1713  NA 137 131*  K 4.3 4.1  CL 104 98  CO2 25 19  GLUCOSE 93 86  BUN 19 15  CREATININE 1.05 0.97  CALCIUM 8.8 8.9  MG -- --  PHOS -- --    Basename 11/03/11 1713  AST 17  ALT 12  ALKPHOS 90  BILITOT 0.2*  PROT 6.6  ALBUMIN 3.6   No results found for this basename: LIPASE:2,AMYLASE:2 in the last 72 hours  Basename 11/04/11 0500 11/03/11 1713  WBC 6.4 8.9  NEUTROABS -- 3.7  HGB 13.5 14.6  HCT 39.3 41.4  MCV 92.3 90.0  PLT 128* 148*    Basename 11/04/11 1140 11/04/11 0434 11/03/11 2102 11/03/11 1713  CKTOTAL 59 55 61 --  CKMB 1.8 1.9 2.1 --  TROPONINI <0.30 <0.30 <0.30 <0.30   No components found with this basename: POCBNP:3 No results found for this basename: TSH,T4TOTAL,FREET3,T3FREE,THYROIDAB in the last 72 hours   Radiology/Studies: Dg Chest  Portable 1 View 11/03/2011   *RADIOLOGY REPORT*  Clinical Data: Atrial fibrillation, hypotension, shortness of breath  PORTABLE CHEST - 1 VIEW  Comparison: 03/20/2011  Findings: Chronic interstitial markings/emphysematous changes.  No pleural effusion or pneumothorax.  Borderline cardiomegaly.   IMPRESSION: No evidence of acute cardiopulmonary disease.  Chronic interstitial markings/emphysematous changes.  Borderline cardiomegaly.    Original Report Authenticated By: Charline Bills, M.D.     Echocardiogram January 2013 Conclusions: - Left ventricle: The cavity size was normal. Wall thickness was increased in a pattern of mild LVH. Systolic function was severely reduced. The estimated ejection fraction was in the range  of 25% to 30%. Diffuse hypokinesis. - Left atrium: The atrium was mildly dilated. 38 mm. - Right ventricle: The cavity size was moderately dilated. - Right atrium: The atrium was moderately dilated.  Telemetry: atrial fibrillation, rate controlled currently   Assessment and Plan  1. Atrial fibrillation - Continue rate control with metoprolol. Continue Xarelto for now (per Dr. Ludwig Clarks recs yesterday, should have no more than 30-day prescription at this time until he proves he will be compliant with follow-up and abstaining from EtOH). Follow-up in our [place] office in 2-3 weeks. 2. Dilated cardiomyopathy - Continue beta blocker and up-titrate as needed for optimal rate control. BP was too low yesterday to start ACEI/ARB. Consider adding as an outpatient. 3. Alcohol abuse - Counseled at length regarding the importance of abstaining from EtOH. He declined Child psychotherapist consultation and substance abuse rehabilitation. It was explained to him that his long-term prognosis is poor unless his alcoholism is corrected.  Dr. Johney Frame to see and make further recommendations. Signed, EDMISTEN, BROOKE PA-C  I have seen, examined the patient, and reviewed the above assessment and plan.  Changes to above are made where necessary.  He reports that he is doing well today.  The importance of compliance was again discussed at length.  I agree with Dr Jens Som that his prognosis is very poor unless he complies. Vrates are presently controlled. Discharge on his current regimen.   Follow-up with Dr Kirke Corin Sept 13th at 9am.  Co Sign: Hillis Range, MD 11/05/2011 11:11 AM

## 2011-11-05 NOTE — Progress Notes (Signed)
CSW attempted to assess patient however pt dc home within 24 hours of referral and csw unable to complete assessment.Marland Kitchen Marland KitchenNo further Clinical Social Work needs, signing off.   Catha Gosselin, Theresia Majors  [phone] .11/05/2011 1327pm

## 2011-11-22 ENCOUNTER — Encounter: Payer: Medicare Other | Admitting: Cardiovascular Disease

## 2011-11-22 ENCOUNTER — Ambulatory Visit: Payer: Self-pay | Admitting: Internal Medicine

## 2011-12-01 ENCOUNTER — Inpatient Hospital Stay (HOSPITAL_COMMUNITY)
Admission: EM | Admit: 2011-12-01 | Discharge: 2011-12-03 | DRG: 309 | Disposition: A | Discharge status: Home / Self Care | Payer: Medicare Other | Attending: Family Medicine | Admitting: Family Medicine

## 2011-12-01 ENCOUNTER — Encounter (HOSPITAL_COMMUNITY): Payer: Self-pay | Admitting: *Deleted

## 2011-12-01 ENCOUNTER — Emergency Department (HOSPITAL_COMMUNITY): Payer: Medicare Other

## 2011-12-01 DIAGNOSIS — Z91199 Patient's noncompliance with other medical treatment and regimen due to unspecified reason: Secondary | ICD-10-CM

## 2011-12-01 DIAGNOSIS — N179 Acute kidney failure, unspecified: Secondary | ICD-10-CM | POA: Diagnosis present

## 2011-12-01 DIAGNOSIS — I4891 Unspecified atrial fibrillation: Secondary | ICD-10-CM

## 2011-12-01 DIAGNOSIS — Z9119 Patient's noncompliance with other medical treatment and regimen: Secondary | ICD-10-CM

## 2011-12-01 DIAGNOSIS — F3289 Other specified depressive episodes: Secondary | ICD-10-CM | POA: Diagnosis present

## 2011-12-01 DIAGNOSIS — K649 Unspecified hemorrhoids: Secondary | ICD-10-CM

## 2011-12-01 DIAGNOSIS — F1010 Alcohol abuse, uncomplicated: Secondary | ICD-10-CM | POA: Diagnosis present

## 2011-12-01 DIAGNOSIS — F1020 Alcohol dependence, uncomplicated: Secondary | ICD-10-CM | POA: Diagnosis present

## 2011-12-01 DIAGNOSIS — I5022 Chronic systolic (congestive) heart failure: Secondary | ICD-10-CM

## 2011-12-01 DIAGNOSIS — F17200 Nicotine dependence, unspecified, uncomplicated: Secondary | ICD-10-CM

## 2011-12-01 DIAGNOSIS — I426 Alcoholic cardiomyopathy: Secondary | ICD-10-CM | POA: Diagnosis present

## 2011-12-01 DIAGNOSIS — D696 Thrombocytopenia, unspecified: Secondary | ICD-10-CM

## 2011-12-01 DIAGNOSIS — R079 Chest pain, unspecified: Secondary | ICD-10-CM | POA: Diagnosis present

## 2011-12-01 DIAGNOSIS — F329 Major depressive disorder, single episode, unspecified: Secondary | ICD-10-CM | POA: Diagnosis present

## 2011-12-01 DIAGNOSIS — B1910 Unspecified viral hepatitis B without hepatic coma: Secondary | ICD-10-CM | POA: Diagnosis present

## 2011-12-01 LAB — COMPREHENSIVE METABOLIC PANEL
ALT: 18 U/L (ref 0–53)
AST: 45 U/L — ABNORMAL HIGH (ref 0–37)
Albumin: 4.2 g/dL (ref 3.5–5.2)
CO2: 24 mEq/L (ref 19–32)
Calcium: 9.8 mg/dL (ref 8.4–10.5)
Chloride: 99 mEq/L (ref 96–112)
Creatinine, Ser: 1.16 mg/dL (ref 0.50–1.35)
GFR calc non Af Amer: 64 mL/min — ABNORMAL LOW (ref >90)
Sodium: 138 mEq/L (ref 135–145)

## 2011-12-01 LAB — PROTIME-INR: INR: 1.28 (ref 0.00–1.49)

## 2011-12-01 LAB — CBC
MCH: 31.5 pg (ref 26.0–34.0)
MCV: 91 fL (ref 78.0–100.0)
Platelets: 182 10*3/uL (ref 150–400)
RBC: 5.11 MIL/uL (ref 4.22–5.81)
RDW: 13.4 % (ref 11.5–15.5)
WBC: 10 10*3/uL (ref 4.0–10.5)

## 2011-12-01 LAB — APTT: aPTT: 40 seconds — ABNORMAL HIGH (ref 24–37)

## 2011-12-01 LAB — ETHANOL: Alcohol, Ethyl (B): <11 mg/dL (ref 0–11)

## 2011-12-01 LAB — MRSA PCR SCREENING: MRSA by PCR: NEGATIVE

## 2011-12-01 MED ORDER — BIOTENE DRY MOUTH MT LIQD
15.0000 mL | Freq: Two times a day (BID) | OROMUCOSAL | Status: DC
  Administered 2011-12-01 – 2011-12-02 (×3): 15 mL via OROMUCOSAL

## 2011-12-01 MED ORDER — LORAZEPAM 1 MG PO TABS
1.0000 mg | ORAL_TABLET | Freq: Once | ORAL | Status: AC
  Administered 2011-12-01: 1 mg via ORAL

## 2011-12-01 MED ORDER — HEPARIN SODIUM (PORCINE) 5000 UNIT/ML IJ SOLN: 5000.0000 [IU] | Freq: Three times a day (TID) | INTRAMUSCULAR | Status: DC

## 2011-12-01 MED ORDER — HEPARIN SODIUM (PORCINE) 5000 UNIT/ML IJ SOLN
5000.0000 [IU] | Freq: Three times a day (TID) | INTRAMUSCULAR | Status: DC
  Filled 2011-12-01 (×2): qty 1

## 2011-12-01 MED ORDER — ASPIRIN EC 325 MG PO TBEC: 325.0000 mg | DELAYED_RELEASE_TABLET | Freq: Every day | ORAL | Status: DC

## 2011-12-01 MED ORDER — LORAZEPAM 1 MG PO TABS
ORAL_TABLET | ORAL | Status: AC
  Administered 2011-12-01: 1 mg via ORAL
  Filled 2011-12-01: qty 1

## 2011-12-01 MED ORDER — METOPROLOL TARTRATE 25 MG PO TABS
25.0000 mg | ORAL_TABLET | Freq: Three times a day (TID) | ORAL | Status: DC
  Administered 2011-12-01: 25 mg via ORAL
  Filled 2011-12-01 (×5): qty 1

## 2011-12-01 MED ORDER — SODIUM CHLORIDE 0.9 % IV SOLN: INTRAVENOUS | Status: DC

## 2011-12-01 MED ORDER — RIVAROXABAN 20 MG PO TABS
20.0000 mg | ORAL_TABLET | Freq: Every day | ORAL | Status: DC
  Administered 2011-12-01: 20 mg via ORAL
  Filled 2011-12-01: qty 1

## 2011-12-01 MED ORDER — DILTIAZEM HCL 100 MG IV SOLR
5.0000 mg/h | Freq: Once | INTRAVENOUS | Status: AC
  Administered 2011-12-01: 5 mg/h via INTRAVENOUS
  Filled 2011-12-01: qty 100

## 2011-12-01 MED ORDER — SODIUM CHLORIDE 0.9 % IV SOLN
1000.0000 mL | INTRAVENOUS | Status: DC
  Administered 2011-12-01: 1000 mL via INTRAVENOUS

## 2011-12-01 MED ORDER — TRAZODONE HCL 50 MG PO TABS
50.0000 mg | ORAL_TABLET | Freq: Every evening | ORAL | Status: DC | PRN
  Administered 2011-12-01: 50 mg via ORAL
  Filled 2011-12-01 (×2): qty 1

## 2011-12-01 MED ORDER — ASPIRIN EC 325 MG PO TBEC
325.0000 mg | DELAYED_RELEASE_TABLET | Freq: Every day | ORAL | Status: DC
  Filled 2011-12-01: qty 1

## 2011-12-01 NOTE — H&P (Addendum)
[facility] History and Physical  [name] [name]:[id number] DOB: 08-Feb-1946 DOA: 12/01/2011  Referring physician: Silverio Lay, MD PCP: Sheila Oats, MD   Chief Complaint: Afib  HPI: [name] is a 66 y.o. male  Who presented to the ED.  He states he exerted himself pretty heavily yesterday with push-mowing and "walkeed" 10 miles yesterday and noted later that his HR was maybe 180.  Last Pm when he got home he took his medicaitons, started to feel tired, laid down and couldn;t go to sleep despite taking [place] He state he is getting stressed out and has not been able to sleep for the past 2 nights depsite Ambien.  He states he livesd alone and the stress of moving x3 this year has been difficult-He hasn;t been really able to afford his meds until he got his medicare/mediciad- (such as the trazadone for sleep, the Metoprolol for A fib)-Has been getting the trial packs of Xarelto and got 3 trial packs fro [place] last week.  He is almost out of his metoprolol and admits that he has had to stretch the meds a little He states that he had a DCCV Sept 13th by Dr. Gwen Pounds and seemed to stay in rhytmn through yesterday and last night. On the am of 9/22 he "felt horrible" and tried to go to eat breakfast at [employer]-he got more tired and tired and had "chest pressure" and also got Dizzy-He qualifies this as the room spinning around.  Had to sit donwn and stop what he was doing-he didn;t think this was a panic attack but cannot be sure. Cannot really qualify the Chest pain-did not have diaphoresis or nausea with this Moved house back to [place] from [place] this past wek  Review of Systems:  No current CP, No N, V, +SOB, No cough right now, no fever, no chills, no dysuria, no falls recently , noblood in stool or urine,   Past Medical History  Diagnosis Date  . Atrial fibrillation   . Angina   . Hepatitis     hepatitis B  . Hypertension   . 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 03/21/2011 - 2D Echo: EF 25% to 30%. Diffuse hypokinesis.  Marland Kitchen HEMORRHOIDS 01/17/2009  . DEPRESSION 01/17/2009  . ALCOHOL ABUSE 01/18/2009   Chart review  Voluntarily committed 06/18/2007 for Heavy drinking to [facility]  Admission 06/28/08 for Afib-follwed with [facility] at that time  Admit 12/06/2008 with Afib-myoview was read as normal at that admit [inferior thinning but no ischemia]-Ef 61%  Admission 11/04/09 to [facility] for detox  Admission 12/12/09 with MDD and SI, ETOH abuse  Admission 01/03/11 for CP, Afib-CT scan at the time ruled out acute process-Lung nodule noted at that time  Admit 02/11/11 with Afib and RVR-dizzyness--had per note TEE and cardioversion 12/2010 at [facility] in [place], [place]  Admit 12/26 c CP and Afib  Admit 03/17/2011-NOted to be homeless- c Afib  Admit 03/20/11 with Afib i know this patient as of that admission  Past Surgical History  Procedure Date  . Mouth surgery    Social History:  reports that he has been smoking Cigarettes.  He has been smoking about 1 pack per day. He does not have any smokeless tobacco history on file. He reports that he drinks about 10.8 ounces of alcohol per week. He reports that he does not use illicit drugs.   History   Social History  . Marital Status: Divorced    Spouse Name:  N/A    Number of Children: N/A  . Years of Education: N/A   Social History Main Topics  . Smoking status: Current Every Day Smoker -- 1.0 packs/day for 40 years    Types: Cigarettes  . Smokeless tobacco: None  . Alcohol Use: 10.8 oz/week    18 Cans of beer per week     heavy drinker-drank last wednesday drank about 4 beers spaced out over 5 hrs  . Drug Use: No  . Sexually Active: No   Other Topics Concern  . None   Social History Narrative   Works in Human resources officer carsWas homelessHas moved homes x 3 in 2 monthsStates cannot always afford meds-See PI     No Known Allergies  Family History  Problem Relation  Age of Onset  . Heart disease Neg Hx      Prior to Admission medications   Medication Sig Start Date End Date Taking? Authorizing Provider  metoprolol tartrate (LOPRESSOR) 25 MG tablet Take 50 mg by mouth 2 (two) times daily. 11/05/11 11/04/12 Yes Srikar Cherlynn Kaiser, MD  Rivaroxaban (XARELTO) 20 MG TABS Take 1 tablet (20 mg total) by mouth daily with supper. 11/05/11  Yes Srikar Cherlynn Kaiser, MD  zolpidem (AMBIEN) 10 MG tablet Take 10 mg by mouth at bedtime as needed.   Yes Historical Provider, MD   Physical Exam: Filed Vitals:   12/01/11 1630 12/01/11 1700 12/01/11 1730 12/01/11 1732  BP: 112/69 124/72 119/65 119/65  Pulse: 71 61 87 95  Resp: 18 21 26    SpO2: 100% 98% 98%      Alert oriented in NAD-Seems Depressed CTA B S1 S2 irreg irreg-no noted m Abd soft nt nd LE's soft no edema  Labs on Admission:  Basic Metabolic Panel:  Lab 12/01/11 1610  NA 138  K 4.5  CL 99  CO2 24  GLUCOSE 90  BUN 25*  CREATININE 1.16  CALCIUM 9.8  MG --  PHOS --   Liver Function Tests:  Lab 12/01/11 1415  AST 45*  ALT 18  ALKPHOS 112  BILITOT 0.7  PROT 7.3  ALBUMIN 4.2   No results found for this basename: LIPASE:5,AMYLASE:5 in the last 168 hours No results found for this basename: AMMONIA:5 in the last 168 hours CBC:  Lab 12/01/11 1415  WBC 10.0  NEUTROABS --  HGB 16.1  HCT 46.5  MCV 91.0  PLT 182   Cardiac Enzymes: No results found for this basename: CKTOTAL:5,CKMB:5,CKMBINDEX:5,TROPONINI:5 in the last 168 hours  BNP (last 3 results)  Basename 11/03/11 1713 03/21/11 0204  PROBNP 1294.0* 528.2*   CBG: No results found for this basename: GLUCAP:5 in the last 168 hours  Radiological Exams on Admission: Dg Chest Portable 1 View  12/01/2011  *RADIOLOGY REPORT*  Clinical Data: Shortness of breath.  Tachycardia.  PORTABLE CHEST - 1 VIEW  Comparison: 11/03/2011  Findings: Heart size in the upper normal range.  Emphysema noted. The lungs appear otherwise clear.  Minimally blunted  right lateral costophrenic angle observed.  IMPRESSION:  1.  Emphysema. 2.  Minimally blunted right costophrenic angle could conceivably represent a small right pleural effusion.   Original Report Authenticated By: Dellia Cloud, M.D.     EKG: Independently reviewed. 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 Afib-CHadVasc=3.  HASBLED and other bleeding scores equally elevated-Per cardiology to decide ultimate anticoagulant in a patient who doesn't have material resources, and is a risk for bleed given alcoholism that is ongoing.  Admit to SDU and will adjust meds per Cardiology-Has received Xarelto tonight 9/22-Appreciate input. 2. CP-2 Biomarkes have returned negative-EKG non-suggestive of ischemia-Cycle x3 and rpt EKG am-Suspect Anxiety related>Cardiac 3. 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 (RALES)-defer to cardiologist-consider obtiaining notes from [place] to determine what has been done on this patient 4. ETOH abuse-Last drink 6 days ago-Psychiatry has been consulted for help with long-term Detox-he is pre-contemplative at this time 5. Acute kidney injury, Probably 2/2 to poor po intake-EGFR 64-Hydrate  c Saline 50 cc/hhr 6. Hepatitis B-Never treated.  Could follow at [facility] if he can comply  Code Status: FUll Family Communication: NOne at bedside Disposition Plan: Difficult situation-He doesn't have $$ and admit to stretching meds occasionally.  States his Metoprolol costs $3.75 as he now has Medicare-Yet the paradox is he had 5 beers last week. We will need to determine if he has capacity to make medical decisions for himself prior to him being discharged.   Social worker and Case management consult have been activated  Time spent: 27  Mahala Menghini [facility] Pager [phone]  If 7PM-7AM, please contact night-coverage [email or web address] Password [facility] 12/01/2011, 5:39 PM

## 2011-12-01 NOTE — ED Notes (Signed)
To ED by riding bus, has hx of cardioversion on Friday 11/22/11-- at [facility]-- has been cardioverted 4 times total. States was working outside yesterday, felt heart rate go up, went home, took medicine, has been unable to sleep. Today when pt woke up---weak, nausea, lightheaded.   On arrival A/Ox3, w/d,

## 2011-12-01 NOTE — ED Provider Notes (Signed)
History    CSN: [id number] Arrival date & time 12/01/11  1356 First MD Initiated Contact with Patient 12/01/11 1413     Chief Complaint  Patient presents with  . Chest Pain  . Tachycardia   HPI Patient presents to the emergency room with complaints of chest pain and palpitations. Patient has history of atrial fibrillation. He was recently at [facility] and had a cardioversion procedure on September 13. Patient states he has had cardioversions 4 times total and the past. [place] he was working outside and felt his heart rate go up again. He took his medication but symptoms did not improve and became more severe. He does feel weak nauseated and lightheaded. He denies any chest pain he does have some pressure in his chest. Past Medical History  Diagnosis Date  . Atrial fibrillation   . Angina   . Hepatitis     hepatitis B  . Hypertension   . 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 03/21/2011 - 2D Echo: EF 25% to 30%. Diffuse hypokinesis.  Marland Kitchen HEMORRHOIDS 01/17/2009  . DEPRESSION 01/17/2009  . ALCOHOL ABUSE 01/18/2009    Past Surgical History  Procedure Date  . Mouth surgery     Family History  Problem Relation Age of Onset  . Heart disease Neg Hx     History  Substance Use Topics  . Smoking status: Current Every Day Smoker -- 1.0 packs/day    Types: Cigarettes  . Smokeless tobacco: Not on file  . Alcohol Use: 10.8 oz/week    18 Cans of beer per week     heavy drinker      Review of Systems  All other systems reviewed and are negative.    Allergies  Review of patient's allergies indicates no known allergies.  Home Medications   Current Outpatient Rx  Name Route Sig Dispense Refill  . METOPROLOL TARTRATE 25 MG PO TABS Oral Take 50 mg by mouth 2 (two) times daily.    Marland Kitchen RIVAROXABAN 20 MG PO TABS Oral Take 1 tablet (20 mg total) by mouth daily with supper. 10 tablet 0  . ZOLPIDEM TARTRATE 10 MG PO TABS Oral Take 10 mg by mouth at bedtime as  needed.      BP 109/82  Pulse 150  Resp 25  SpO2 99%  Physical Exam  Nursing note and vitals reviewed. Constitutional: He appears well-developed and well-nourished. No distress.  HENT:  Head: Normocephalic and atraumatic.  Right Ear: External ear normal.  Left Ear: External ear normal.  Eyes: Conjunctivae normal are normal. Right eye exhibits no discharge. Left eye exhibits no discharge. No scleral icterus.  Neck: Neck supple. No tracheal deviation present.  Cardiovascular: Intact distal pulses.  An irregularly irregular rhythm present. Tachycardia present.   Pulmonary/Chest: Breath sounds normal. No stridor. Not tachypneic. No respiratory distress. He has no wheezes. He has no rales.  Abdominal: Soft. Bowel sounds are normal. He exhibits no distension. There is no tenderness. There is no rebound and no guarding.  Musculoskeletal: He exhibits no edema and no tenderness.  Neurological: He is alert. He has normal strength. No sensory deficit. Cranial nerve deficit:  no gross defecits noted. He exhibits normal muscle tone. He displays no seizure activity. Coordination normal.  Skin: Skin is warm and dry. No rash noted.  Psychiatric: He has a normal mood and affect.    ED Course  Procedures (including critical care time) EKG Rate 151 Nl Axis Atrial fibrillation with rapid ventricular  response Nonspecific ST and T wave abnormality , probably digitalis effect Abnormal ECG No sig change compared to last tracing  CRITICAL CARE Performed by: Celene Kras Total critical care time: 35 Critical care time was exclusive of separately billable procedures and treating other patients. Critical care was necessary to treat or prevent imminent or life-threatening deterioration. Critical care was time spent personally by me on the following activities: development of treatment plan with patient and/or surrogate as well as nursing, discussions with consultants, evaluation of patient's response to  treatment, examination of patient, obtaining history from patient or surrogate, ordering and performing treatments and interventions, ordering and review of laboratory studies, ordering and review of radiographic studies, pulse oximetry and re-evaluation of patient's condition.   Labs Reviewed  COMPREHENSIVE METABOLIC PANEL - Abnormal; Notable for the following:    BUN 25 (*)     AST 45 (*)     GFR calc non Af Amer 64 (*)     GFR calc Af Amer 75 (*)     All other components within normal limits  CBC  POCT I-STAT TROPONIN I  PROTIME-INR  APTT   Dg Chest Portable 1 View  12/01/2011  *RADIOLOGY REPORT*  Clinical Data: Shortness of breath.  Tachycardia.  PORTABLE CHEST - 1 VIEW  Comparison: 11/03/2011  Findings: Heart size in the upper normal range.  Emphysema noted. The lungs appear otherwise clear.  Minimally blunted right lateral costophrenic angle observed.  IMPRESSION:  1.  Emphysema. 2.  Minimally blunted right costophrenic angle could conceivably represent a small right pleural effusion.   Original Report Authenticated By: Dellia Cloud, M.D.      1. Atrial fibrillation with RVR   2. 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  He is still in a fib although much better rate control.  Pt states in the past he becomes very tachycardic off the cardizem.  His cardiologist in [place] has recommended possible ablation.  Discussed with Dr Elease Hashimoto who will come to see the patient in the ED and make recommendations.        Celene Kras, MD 12/01/11 [phone]

## 2011-12-01 NOTE — ED Notes (Signed)
Unit secretary reports that RN's are not available for report.

## 2011-12-01 NOTE — ED Notes (Signed)
Lab reports not enough blood in light blue tube. Ordered for recollection.

## 2011-12-01 NOTE — Consult Note (Signed)
Cardiology Consult Note   Patient ID: [name] MRN: [id number], DOB/AGE: 1945-10-08   Admit date: 12/01/2011 Date of Consult: 12/01/2011  Primary Physician: Sheila Oats, MD Primary Cardiologist: [place]. Has seen Dr. Jens Som in consultation in the past. He has not followed up in the office for approximately 2 year per patient. There are no office notes in the system.   Reason for consult: evaluation management of a-fib + RVR  HPI: [name] is a 66yo male with PMHx significant persistent a-fib + RVR (on Xarelto), dilated cardiomyopathy (LVEF 25-30%, diffuse HK, mild LVH, mild LA dilation, moderate RV/RA dilatation), alcohol abuse, hepatitis B (normal LFTs today), medication noncompliance, HTN who presents to [facility] ED with complaints of chest pain, shortness of breath and palpitations.   He reports undergoing DCCV about 2-3 weeks ago for a-fib with RVR. Since that time, he has felt well and at baseline until last night. He reports knowing he converted back to a-fib. He states his HR was around 160. He endorsed shortness of breath, chest pain and palpitations. There is no exertional component to his chest pain. He reports it is similar in quality prior episodes of a-fib with RVR and began last night. He denies orthopnea, PND, LE edema, DOE, fevers, chills, extended travel, unillateral leg swelling, redness or tenderness. He denies consuming alcohol for the last 2 weeks. No elicit drug use. He reports medication compliance. He states he has been to multiple hospitals in the area for the same issue, and undergone cardioversions with conversion to NSR. Dr. Jens Som consulted late last month for an almost identical presentation. He was switchted to Lopressor for rate-control and given his underlying dilated cardiomyopathy. The thought was to consider ACEi once BP allows. Alcohol abuse and noncompliance came into question regarding long term and consistent management going forward.   In  the ED, EKG reveals a-fib with RVR, QT prolongation and nonspecific ST/T wave abnormalities. Initial trop-I returned WNL. CXR reveals emphysema type changes and questionable small right pleural effusion. CNET unremarkable aside from a minimally elevated AST. He was started on Cardizem gtt with HR reduced to the high 90s. He is still symptomatic endorsing palpitations, shortness of breath and fatigue.   Problem List: Past Medical History  Diagnosis Date  . Atrial fibrillation   . Angina   . Hepatitis     hepatitis B  . Hypertension   . 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 03/21/2011 - 2D Echo: EF 25% to 30%. Diffuse hypokinesis.  Marland Kitchen HEMORRHOIDS 01/17/2009  . DEPRESSION 01/17/2009  . ALCOHOL ABUSE 01/18/2009    Past Surgical History  Procedure Date  . Mouth surgery      Allergies: No Known Allergies  Home Medications: Prior to Admission medications   Medication Sig Start Date End Date Taking? Authorizing Provider  metoprolol tartrate (LOPRESSOR) 25 MG tablet Take 50 mg by mouth 2 (two) times daily. 11/05/11 11/04/12 Yes Srikar Cherlynn Kaiser, MD  Rivaroxaban (XARELTO) 20 MG TABS Take 1 tablet (20 mg total) by mouth daily with supper. 11/05/11  Yes Srikar Cherlynn Kaiser, MD  zolpidem (AMBIEN) 10 MG tablet Take 10 mg by mouth at bedtime as needed.   Yes Historical Provider, MD    Inpatient Medications:     . diltiazem (CARDIZEM) infusion  5-15 mg/hr Intravenous Once  . LORazepam  1 mg Oral Once    (Not in a hospital admission)  Family History  Problem Relation Age of Onset  . Heart disease Neg  Hx      History   Social History  . Marital Status: Divorced    Spouse Name: N/A    Number of Children: N/A  . Years of Education: N/A   Occupational History  . Not on file.   Social History Main Topics  . Smoking status: Current Every Day Smoker -- 1.0 packs/day    Types: Cigarettes  . Smokeless tobacco: Not on file  . Alcohol Use: 10.8 oz/week    18 Cans of beer per week      heavy drinker  . Drug Use: No  . Sexually Active: No   Other Topics Concern  . Not on file   Social History Narrative  . No narrative on file     Review of Systems: General: negative for chills, fever, night sweats or weight changes.  Cardiovascular: positive for chest pain, shortness of breath, palpitations, negative for dyspnea on exertion, edema, orthopnea, palpitations, paroxysmal nocturnal dyspnea Dermatological: negative for rash Respiratory: positive for chronic, nonproductive cough or wheezing Urologic: negative for hematuria Abdominal: negative for nausea, vomiting, diarrhea, bright red blood per rectum, melena, or hematemesis Neurologic: negative for visual changes, syncope, or dizziness All other systems reviewed and are otherwise negative except as noted above.  Physical Exam: Blood pressure 113/77, pulse 78, resp. rate 24, SpO2 99.00%.    General:  Well developed, well nourished, in no acute distress. Head:  Normocephalic, atraumatic, sclera non-icteric, no xanthomas, nares are without discharge.  Neck:  Negative for carotid bruits. JVD not elevated. Lungs:  Clear bilaterally to auscultation without wheezes, rales, or rhonchi. Breathing is unlabored. Heart: Irregularly irregular with S1 S2. No murmurs, rubs, or gallops appreciated. Abdomen:  Soft, non-tender, non-distended with normoactive bowel sounds. No hepatomegaly. No rebound/guarding. No obvious abdominal masses.  thin Msk:   Strength and tone appears normal for age. Extremities:  No clubbing, cyanosis or edema.  Distal pedal pulses are 2+ and equal bilaterally. Neuro:  Alert and oriented X 3. Moves all extremities spontaneously. Psych:   Responds to questions appropriately with a normal affect.  Labs: Recent Labs  Basename 12/01/11 1415   WBC 10.0   HGB 16.1   HCT 46.5   MCV 91.0   PLT 182   Lab 12/01/11 1415  NA 138  K 4.5  CL 99  CO2 24  BUN 25*  CREATININE 1.16  CALCIUM 9.8  PROT 7.3    BILITOT 0.7  ALKPHOS 112  ALT 18  AST 45*  AMYLASE --  LIPASE --  GLUCOSE 90   Radiology/Studies: Dg Chest Portable 1 View  12/01/2011  *RADIOLOGY REPORT*  Clinical Data: Shortness of breath.  Tachycardia.  PORTABLE CHEST - 1 VIEW  Comparison: 11/03/2011  Findings: Heart size in the upper normal range.  Emphysema noted. The lungs appear otherwise clear.  Minimally blunted right lateral costophrenic angle observed.  IMPRESSION:  1.  Emphysema. 2.  Minimally blunted right costophrenic angle could conceivably represent a small right pleural effusion.   Original Report Authenticated By: Dellia Cloud, M.D.    Dg Chest Portable 1 View  11/03/2011  *RADIOLOGY REPORT*  Clinical Data: Atrial fibrillation, hypotension, shortness of breath  PORTABLE CHEST - 1 VIEW  Comparison: 03/20/2011  Findings: Chronic interstitial markings/emphysematous changes.  No pleural effusion or pneumothorax.  Borderline cardiomegaly.  IMPRESSION: No evidence of acute cardiopulmonary disease.  Chronic interstitial markings/emphysematous changes.  Borderline cardiomegaly.   Original Report Authenticated By: Charline Bills, M.D.     EKG: atrial fibrillation with  RVR, 151 bpm, QTc 507   ASSESSMENT:   1. Atrial fibrillation + RVR 2. Dilated cardiomyopathy 3. Alcohol abuse 4. Medication noncompliance 5. HTN  DISCUSSION/PLAN:  Patient admitted and/or presented to an ED numerous times for the same complaint with underlying atrial fibrillation as the cause. He has a history of medication noncompliance, lack of follow-up and alcohol abuse which have exacerbated a-fib with RVR in the past. He has underlying dilated cardiomyopathy likely as a consequence of his alcohol abuse providing the predisposition to developing arrhythmias. These issues certainly complicate and hinder his long term management going forward. He is currently rate-controlled on Cardizem IV. Hopefully this will improve his symptoms. Will continue  home-dose Lopressor and wean off of Cardizem gtt. Assess for addition of ACEi. Cardioversion may provide more symptomatic benefit, however his period of maintaining NSR was only 2 weeks this time around before converting back to a-fib. This may only be a short, temporizing measure. He will need definitive follow-up and definitive abstinence from alcohol before consideration of an antiarrhythmic for the maintenance of NSR. Cycle cardiac biomarkers. Continue Xarelto. Further recommendations per MD.    Signed, R. Hurman Horn, PA-C 12/01/2011, 4:36 PM   Attending Note:   The patient was seen and examined.  Agree with assessment and plan as noted above.  I have edited the above H&P as indicated.  [name] real issue is that if chronic alcoholism.  He has seen multiple cardiologist in multiple cities for this same issue.  He has never made it to an office visit with [place].  He has an alcoholic cardiomyopathy as well as Atrial fib ( which is also exacerbated by alcohol).  He has had 3 cardioversions ( according to patient) over the past 8 weeks.  He will likely need to be started on an antiarrhythmic to help him stay in NSR.   His inability to make follow up appointments makes antiarrhythmic therapy  dangerous .    I have told him the importance of following up with [place] and that we have a significant lack of real information when he skips from hospital to hospital for the same condition.    See Dr. Ludwig Clarks discussion from consult note 11/04/11.   Vesta Mixer, Montez Hageman., MD, [facility] 12/01/2011, 5:45 PM

## 2011-12-01 NOTE — ED Notes (Signed)
Hospitalist at bedside 

## 2011-12-01 NOTE — ED Notes (Signed)
Cardiologist Nasher plans to start pt on PO metoprolol and wean pt off cardizem drip. Pt awaiting bed assignment.

## 2011-12-01 NOTE — ED Notes (Signed)
20mg  Cardizem bolus administered then IV drip started at 5mg /hr.

## 2011-12-02 ENCOUNTER — Inpatient Hospital Stay (HOSPITAL_COMMUNITY): Payer: Medicare Other

## 2011-12-02 DIAGNOSIS — I4891 Unspecified atrial fibrillation: Principal | ICD-10-CM

## 2011-12-02 LAB — CBC
HCT: 41 % (ref 39.0–52.0)
Hemoglobin: 14.1 g/dL (ref 13.0–17.0)
MCHC: 34.4 g/dL (ref 30.0–36.0)
MCV: 91.1 fL (ref 78.0–100.0)
RDW: 13.8 % (ref 11.5–15.5)
WBC: 7.2 10*3/uL (ref 4.0–10.5)

## 2011-12-02 LAB — GLUCOSE, CAPILLARY

## 2011-12-02 LAB — BASIC METABOLIC PANEL
BUN: 25 mg/dL — ABNORMAL HIGH (ref 6–23)
CO2: 23 mEq/L (ref 19–32)
Chloride: 104 mEq/L (ref 96–112)
Creatinine, Ser: 0.91 mg/dL (ref 0.50–1.35)
Potassium: 4.4 mEq/L (ref 3.5–5.1)

## 2011-12-02 LAB — TROPONIN I: Troponin I: <0.3 ng/mL (ref <0.30)

## 2011-12-02 MED ORDER — METOPROLOL TARTRATE 50 MG PO TABS
50.0000 mg | ORAL_TABLET | Freq: Two times a day (BID) | ORAL | Status: DC
  Administered 2011-12-02 (×2): 50 mg via ORAL
  Filled 2011-12-02 (×4): qty 1

## 2011-12-02 MED ORDER — NICOTINE 14 MG/24HR TD PT24
14.0000 mg | MEDICATED_PATCH | Freq: Every day | TRANSDERMAL | Status: DC
  Administered 2011-12-02 – 2011-12-03 (×2): 14 mg via TRANSDERMAL
  Filled 2011-12-02 (×2): qty 1

## 2011-12-02 MED ORDER — SODIUM CHLORIDE 0.9 % IV BOLUS (SEPSIS)
500.0000 mL | Freq: Once | INTRAVENOUS | Status: AC
  Administered 2011-12-02: 500 mL via INTRAVENOUS

## 2011-12-02 MED ORDER — RIVAROXABAN 20 MG PO TABS
20.0000 mg | ORAL_TABLET | Freq: Every day | ORAL | Status: DC
  Administered 2011-12-02 – 2011-12-03 (×2): 20 mg via ORAL
  Filled 2011-12-02 (×2): qty 1

## 2011-12-02 NOTE — Progress Notes (Signed)
@   Subjective:  Denies CP or dyspnea   Objective:  Filed Vitals:   12/02/11 0548 12/02/11 0600 12/02/11 0700 12/02/11 0730  BP:  111/65 116/74 115/91  Pulse:  60 100 48  Temp:      TempSrc:      Resp:  16 16 19   Height:      Weight: 205 lb 4 oz (93.1 kg)     SpO2:  95% 96% 97%    Intake/Output from previous day:  Intake/Output Summary (Last 24 hours) at 12/02/11 0742 Last data filed at 12/02/11 0600  Gross per 24 hour  Intake 1032.41 ml  Output    500 ml  Net 532.41 ml    Physical Exam: Physical exam: Well-developed well-nourished in no acute distress.  Skin is warm and dry.  HEENT is normal.  Neck is supple. Chest is clear to auscultation with normal expansion.  Cardiovascular exam is tachycardic and irregular Abdominal exam nontender or distended. No masses palpated. Extremities show no edema. neuro grossly intact    Lab Results: Basic Metabolic Panel:  Basename 12/02/11 0231 12/01/11 1415  NA 138 138  K 4.4 4.5  CL 104 99  CO2 23 24  GLUCOSE 96 90  BUN 25* 25*  CREATININE 0.91 1.16  CALCIUM 8.5 9.8  MG -- --  PHOS -- --   CBC:  Basename 12/02/11 0231 12/01/11 1415  WBC 7.2 10.0  NEUTROABS -- --  HGB 14.1 16.1  HCT 41.0 46.5  MCV 91.1 91.0  PLT 153 182   Cardiac Enzymes:  Basename 12/02/11 0231 12/01/11 2148  CKTOTAL -- --  CKMB -- --  CKMBINDEX -- --  TROPONINI <0.30 <0.30     Assessment/Plan:  1) Atrial fibrillation - patient remains in atrial fibrillation. His rate is not adequately controlled. Change metoprolol to 50 mg by mouth twice a day. He states he was recently cardioverted in [place] on September 13. We need to obtain those records. I would resume his xeralto for now given recent cardioversion. I agree that he is not a good long-term candidate for anticoagulation given history of alcohol abuse and noncompliance. However I would be hesitant to discontinue this medication as close to cardioversion with elevated risk of embolic  event. I do not think we should pursue repeat cardioversion at this point. I will not add an antiarrhythmic as he has not demonstrated compliance with followup. 2) alcohol abuse-this remains his predominant issue. I discussed this again in depth with him. He needs rehabilitation admission. 3) cardiomyopathy-most likely related to combination of atrial fibrillation with a rapid ventricular response and alcohol. I will not add an ACE inhibitor at this point as his blood pressure is borderline but we will consider this in the future. Patient's long-term prognosis is poor unless alcohol abuse can be addressed. [name] 12/02/2011, 7:42 AM

## 2011-12-02 NOTE — Progress Notes (Signed)
Called provider on call, Pt hypotensive after administration of Trazodone at 22:56, given order for 500 cc bolus.

## 2011-12-02 NOTE — Progress Notes (Signed)
CARE MANAGEMENT NOTE 12/02/2011  Patient:  [name], [name]   Account Number:  [id number]  Date Initiated:  12/02/2011  Documentation initiated by:  Sharday Michl  Subjective/Objective Assessment:   aptient with a.fib requiring iv cardizem and monitoring to controll     Action/Plan:   homeless but has insurance   Anticipated DC Date:  12/05/2011   Anticipated DC Plan:  HOME/SELF CARE  In-house referral  Clinical Social Worker      DC Associate Professor  CM consult  Medication Assistance      PAC Choice  NA   Choice offered to / List presented to:  NA   DME arranged  NA      DME agency  NA     HH arranged  NA      HH agency  NA   Status of service:  In process, will continue to follow Medicare Important Message given?  NA - LOS <3 / Initial given by admissions (If response is "NO", the following Medicare IM given date fields will be blank) Date Medicare IM given:   Date Additional Medicare IM given:    Discharge Disposition:    Per UR Regulation:  Reviewed for med. necessity/level of care/duration of stay  If discussed at Long Length of Stay Meetings, dates discussed:    Comments:  [id number]/Danie Hannig Earlene Plater, RN, BSN, CCM: CHART REVIEWED AND UPDATED. NO DISCHARGE NEEDS PRESENT AT THIS TIME. CASE MANAGEMENT [phone]

## 2011-12-02 NOTE — Progress Notes (Signed)
PROGRESS NOTE  [name] ZOX:[id number] DOB: 02-05-1946 DOA: 12/01/2011 PCP: Sheila Oats, MD  Brief narrative: 66 year old male admitted 9/22 p.m. with rapid ventricular response in a setting of poorly controlled H. fibrillation secondary to noncompliance with medication-his course has been complicated by the fact that he has chronic alcoholism and has poor tear resources. He also has significant psychiatric comorbidities. His blood alcohol level on admission was less than 11.  Past medical history-As per Problem list Chart review  Voluntarily committed 06/18/2007 for Heavy drinking to [facility]  Admission 06/28/08 for Afib-follwed with [facility] at that time  Admit 12/06/2008 with Afib-myoview was read as normal at that admit [inferior thinning but no ischemia]-Ef 61%  Admission 11/04/09 to [facility] for detox  Admission 12/12/09 with MDD and SI, ETOH abuse  Admission 01/03/11 for CP, Afib-CT scan at the time ruled out acute process-Lung nodule noted at that time  Admit 02/11/11 with Afib and RVR-dizzyness--had per note TEE and cardioversion 12/2010 at [facility] in [place], [place]  Admit 12/26 c CP and Afib  Admit 03/17/2011-NOted to be homeless- c Afib  Admit 03/20/11 with Afib i know this patient as of that admission  Consultants:  Cardiology  Procedures:  Chest x-ray 1 view 12/01/2011 = MPC mouth, minimally blunted right costophrenic angle with potential small right pleural effusion  Antibiotics:  None   Subjective  Doing well. Still states he has chest and abdominal pain. No radiation of this pain. Characterization of pain is that it is at the sternum and xiphisternum. Cannot qualify. Past an uneventful night and slept well. No current nausea vomiting blurred vision double vision or dizziness.   Objective    Interim History: Nursing reports mild hypotension last night which resolved with 500 cc bolus  Telemetry: A fibrillation with transient heart rate in the  80s to 100.  Objective: Filed Vitals:   12/02/11 0419 12/02/11 0500 12/02/11 0548 12/02/11 0600  BP:  95/64  111/65  Pulse:  60  60  Temp: 97.6 F (36.4 C)     TempSrc: Oral     Resp:  18  16  Height:      Weight:   93.1 kg (205 lb 4 oz)   SpO2:  95%  95%    Intake/Output Summary (Last 24 hours) at 12/02/11 0734 Last data filed at 12/02/11 0600  Gross per 24 hour  Intake 1032.41 ml  Output    500 ml  Net 532.41 ml    Exam:  Alert oriented in NAD-Seems Depressed  CTA B , no rails no rhonchi S1 S2 irreg irreg-no noted -telemetry reviewed Abd soft nt nd  LE's soft no edema   Data Reviewed: Basic Metabolic Panel:  Lab 12/02/11 8119 12/01/11 1415  NA 138 138  K 4.4 4.5  CL 104 99  CO2 23 24  GLUCOSE 96 90  BUN 25* 25*  CREATININE 0.91 1.16  CALCIUM 8.5 9.8  MG -- --  PHOS -- --   Liver Function Tests:  Lab 12/01/11 1415  AST 45*  ALT 18  ALKPHOS 112  BILITOT 0.7  PROT 7.3  ALBUMIN 4.2   No results found for this basename: LIPASE:5,AMYLASE:5 in the last 168 hours No results found for this basename: AMMONIA:5 in the last 168 hours CBC:  Lab 12/02/11 0231 12/01/11 1415  WBC 7.2 10.0  NEUTROABS -- --  HGB 14.1 16.1  HCT 41.0 46.5  MCV 91.1 91.0  PLT 153 182   Cardiac Enzymes:  Lab  12/02/11 0231 12/01/11 2148  CKTOTAL -- --  CKMB -- --  CKMBINDEX -- --  TROPONINI <0.30 <0.30   BNP: No components found with this basename: POCBNP:5 CBG: No results found for this basename: GLUCAP:5 in the last 168 hours  Recent Results (from the past 240 hour(s))  MRSA PCR SCREENING     Status: Normal   Collection Time   12/01/11  8:24 PM      Component Value Range Status Comment   MRSA by PCR NEGATIVE  NEGATIVE Final      Studies:              All Imaging reviewed and is as per above notation   Scheduled Meds:   . antiseptic oral rinse  15 mL Mouth Rinse BID  . aspirin EC  325 mg Oral Daily  . diltiazem (CARDIZEM) infusion  5-15 mg/hr Intravenous  Once  . heparin  5,000 Units Subcutaneous Q8H  . LORazepam  1 mg Oral Once  . metoprolol tartrate  25 mg Oral TID  . sodium chloride  500 mL Intravenous Once  . DISCONTD: aspirin EC  325 mg Oral Daily  . DISCONTD: heparin  5,000 Units Subcutaneous Q8H  . DISCONTD: rivaroxaban  20 mg Oral Q supper   Continuous Infusions:   . sodium chloride 1,000 mL (12/01/11 1443)  . sodium chloride 1,000 mL (12/01/11 2116)     Assessment/Plan: 1. Afib-CHadVasc=3. HASBLED and other bleeding scores equally elevated-Per cardiology-Appreciate Dr. Ludwig Clarks input in advance-Would recommend ASA 325>Xarelto. 2. CP-2 Biomarkes have returned negative-EKG non-suggestive of ischemia-Cycledx3-Suspect Anxiety related>Cardiac, as CE's are negative 3. 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 (RALES)-defer to cardiologist-Alerted nursing to obtain old notes from al outside hospitalizations 4. ETOH abuse-Last drink 6 days ago-Psychiatry has been consulted for help with long-term Detox-he is pre-contemplative at this time 5. Acute kidney injury, no CKD-likely 2/2 to poor po intake-EGFR 64-Hydrate c Saline 50 cc/hhr-CXR 2 view this am 9/23 to r/o effusion 6. Hepatitis B-Never treated. Could follow at [facility] if he can comply  Code Status: Full  Family Communication: Non at bedside-He has no social support Disposition Plan: Difficult situation-He doesn't have $$ and admit to stretching meds occasionally. States his Metoprolol costs $3.75 as he now has Medicare-Yet the paradox is he had 5 beers last week. We will need to determine if he has capacity to make medical decisions for himself prior to him being discharged. Social worker and Case management consult have been activated    Pleas Koch, MD  [facility] Pager [phone] 12/02/2011, 7:34 AM    LOS: 1 day

## 2011-12-02 NOTE — Progress Notes (Signed)
Pt had an unremarkable night. Early upon arrival to the unit he commented  1: How he took trazodone for sleep because Ambien no longer let him sleep all night 2: That he was hungry and what he ate at 18:00 in the ED was not enough.   He slept very hard otherwise, however trazodone caused a hypotensive event, pt was given a 500 cc bolus.

## 2011-12-03 DIAGNOSIS — R079 Chest pain, unspecified: Secondary | ICD-10-CM

## 2011-12-03 DIAGNOSIS — F1010 Alcohol abuse, uncomplicated: Secondary | ICD-10-CM

## 2011-12-03 LAB — GLUCOSE, CAPILLARY: Glucose-Capillary: 140 mg/dL — ABNORMAL HIGH (ref 70–99)

## 2011-12-03 MED ORDER — TRAZODONE HCL 50 MG PO TABS: 50.0000 mg | ORAL_TABLET | Freq: Every evening | ORAL | Status: DC | PRN

## 2011-12-03 MED ORDER — ZOLPIDEM TARTRATE 10 MG PO TABS: 10.0000 mg | ORAL_TABLET | Freq: Every evening | ORAL | Status: DC | PRN

## 2011-12-03 MED ORDER — NICOTINE 14 MG/24HR TD PT24: 1.0000 | MEDICATED_PATCH | TRANSDERMAL | Status: DC

## 2011-12-03 MED ORDER — METOPROLOL TARTRATE 25 MG PO TABS: 75.0000 mg | ORAL_TABLET | Freq: Two times a day (BID) | ORAL | Status: DC

## 2011-12-03 MED ORDER — METOPROLOL TARTRATE 50 MG PO TABS
75.0000 mg | ORAL_TABLET | Freq: Three times a day (TID) | ORAL | Status: DC
  Administered 2011-12-03: 75 mg via ORAL
  Filled 2011-12-03 (×3): qty 1

## 2011-12-03 MED ORDER — METOPROLOL TARTRATE 50 MG PO TABS
75.0000 mg | ORAL_TABLET | Freq: Two times a day (BID) | ORAL | Status: DC
  Filled 2011-12-03 (×2): qty 1

## 2011-12-03 MED ORDER — RIVAROXABAN 20 MG PO TABS: 20.0000 mg | ORAL_TABLET | Freq: Every day | ORAL | Status: DC

## 2011-12-03 NOTE — Progress Notes (Signed)
@   Subjective:  Denies CP or dyspnea; didn't sleep well   Objective:  Filed Vitals:   12/03/11 0300 12/03/11 0400 12/03/11 0500 12/03/11 0600  BP: 94/69 107/65 117/76 116/84  Pulse: 55 76 67 79  Temp:  97.5 F (36.4 C)    TempSrc:  Oral    Resp: 17 18 19 18   Height:      Weight:      SpO2: 97% 97% 97% 97%    Intake/Output from previous day:  Intake/Output Summary (Last 24 hours) at 12/03/11 6578 Last data filed at 12/03/11 0600  Gross per 24 hour  Intake   2100 ml  Output   4000 ml  Net  -1900 ml    Physical Exam: Physical exam: Well-developed well-nourished in no acute distress.  Skin is warm and dry.  HEENT is normal.  Neck is supple. Chest is clear to auscultation with normal expansion.  Cardiovascular exam is irregular Abdominal exam nontender or distended. No masses palpated. Extremities show no edema. neuro grossly intact    Lab Results: Basic Metabolic Panel:  Basename 12/02/11 0231 12/01/11 1415  NA 138 138  K 4.4 4.5  CL 104 99  CO2 23 24  GLUCOSE 96 90  BUN 25* 25*  CREATININE 0.91 1.16  CALCIUM 8.5 9.8  MG -- --  PHOS -- --   CBC:  Basename 12/02/11 0231 12/01/11 1415  WBC 7.2 10.0  NEUTROABS -- --  HGB 14.1 16.1  HCT 41.0 46.5  MCV 91.1 91.0  PLT 153 182   Cardiac Enzymes:  Basename 12/02/11 0231 12/01/11 2148  CKTOTAL -- --  CKMB -- --  CKMBINDEX -- --  TROPONINI <0.30 <0.30     Assessment/Plan:  1) Atrial fibrillation - patient remains in atrial fibrillation. His rate is improved. Change metoprolol to 75 mg by mouth twice a day to optimize control. Patient was recently cardioverted in [place] on September 13. I would continue his xeralto for 28 days following cardioversion. I agree that he is not a good long-term candidate for anticoagulation given history of alcohol abuse and noncompliance. However I would be hesitant to discontinue this medication this close to cardioversion with elevated risk of embolic event. After full  28 days of xeralto, would DC and resume aspirin realizing there is higher risk of embolic event (risk of anticoagulation outweighs benefit long term in my opinion). I do not think we should pursue repeat cardioversion at this point unless he can demonstrate compliance with meds and avoiding ETOH. I will not add an antiarrhythmic as he has not demonstrated compliance with followup. 2) alcohol abuse-this remains his predominant issue. I discussed this again in depth with him. He needs rehabilitation admission. 3) cardiomyopathy-most likely related to combination of atrial fibrillation with a rapid ventricular response and alcohol. I will not add an ACE inhibitor at this point as his blood pressure is borderline but we will consider this in the future. Patient's long-term prognosis is poor unless alcohol abuse can be addressed. We will sign off. Please call with questions [name] 12/03/2011, 6:32 AM

## 2011-12-03 NOTE — Discharge Summary (Addendum)
Physician Discharge Summary  [name]:[id number] DOB: 1945/12/25 DOA: 12/01/2011  PCP: Sheila Oats, MD  Admit date: 12/01/2011 Discharge date: 12/03/2011  Recommendations for Outpatient Follow-up:  1. Needs re-consideration of anticoagulation as an out-patient-Would change from Xarelto back to ASA 325 on 12/29/11 2. Needs complete abstinence from ETOH-has been counseled several times 3. Needs BMET, CBC in about 1 week 4. Psychiatry recommendations are pending-appreciate input in advance --Potentially would use Buspirone 10 mg tid for anxiety/depression?  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 male who presented to the ED 12/01/11. He states he exerted himself pretty heavily yesterday with push-mowing and "walked" 10 miles yesterday and noted later that his HR was maybe 180. Last Pm when he got home he took his medications, started to feel tired, laid down and couldn't go to sleep despite taking [place]  He state he is getting stressed out and has not been able to sleep for the past 2 nights despite Ambien. He states he lives alone and the stress of moving x3 this year has been difficult-He hasn't been really able to afford his meds until he got his medicare/mediciad- (such as the trazadone for sleep, the Metoprolol for A fib)-Has been getting the trial packs of Xarelto and got 3 trial packs from [place] last week. He is almost out of his metoprolol and admits that he has had to stretch the meds a little  He states that he had a  DCCV Sept 13th by Dr. Gwen Pounds and seemed to stay in rhytmn through yesterday and last night.  On the am of 9/22 he "felt horrible" and tried to go to eat breakfast at [employer]-he got more tired and tired and had "chest pressure" and also got Dizzy-He qualifies this as the room spinning around. Had to sit down and stop what he was doing-he didn't think this was a panic attack but cannot be sure.   -Patient appears to have seen Dr. Arnoldo Hooker of cardiology at the [facility] clinic and was cardioverted by him in 11/21/2011 as well -I did obtain internal medicine notes from [facility] where he was admitted 10/30/2011-- he was anxious seen in the emergency department the day before and was given diltiazem IV push which seemed to rate control his heart rate-this denotes the patient ran out of medications sometime in mid August as well and that he has not seen a doctor for his A. fibrillation in one year. On that discharge it was noted that patient was discharged on diltiazem 90 mg twice a day, Xarelto q afternoon aspirin 81 mg daily and lorazepam 0.5 mg 1 tab twice a day as well as trazodone 100 mg each bedtime with strict instructions to followup on  Hospital Course:  1. Afib-CHadVasc=3. HASBLED and other bleeding scores equally elevated-Per cardiology-Appreciate Dr. Ludwig Clarks input--it is imperative that the patient continues huis Xarelto per cardiology at least for one month given he just had DC cardioversion as he has slight elevated risk for ischemic and nonischemic CVA. This will have to be balanced carefully  in the outpatient setting by his primary care physician with the fact that he does drink alcohol and has an elevated bleeding score on anticoagulants because of this. He should be transitioned to by mouth aspirin on 12/29/2011 although his risk would still be elevated, this would probably stirke the best balance for him to be protected from stroke and also protected from GI  versus intracranial bleed 2. CP-2 Biomarkes have returned negative-EKG non-suggestive of ischemia-Cycledx3-Suspect Anxiety related>Cardiac, as CE's are negativeProbable ETOH Cardiomyopathy-Beta blocker, Consider Spironolactone? (RALES)-deferred to cardiologist 3. ETOH abuse-Last drink 6 days ago-Psychiatry has been consulted for help with long-term Detox-he is pre-contemplative at this time-psychiatry has been consulted to assess this patient for capacity to make appropriate decisions for care-They subsequently made recommendations and signed off 4. Acute kidney injury, no CKD-likely 2/2 to poor po intake-EGFR 64-Hydrate c Saline 50 cc/hr-CXR 2 view this am 9/23 to r/o effusion-hold ACE inhibitor at this time given borderline low blood pressures and need for Rate control>Htn control at present 5. Hepatitis B-Never treated. Could follow at [facility] if he can comply   Chart review  Voluntarily committed 06/18/2007 for Heavy drinking to [facility]  Admission 06/28/08 for Afib-follwed with [facility] at that time  Admit 12/06/2008 with Afib-myoview was read as normal at that admit [inferior thinning but no ischemia]-Ef 61%  Admission 11/04/09 to [facility] for detox  Admission 12/12/09 with MDD and SI, ETOH abuse  Admission 01/03/11 for CP, Afib-CT scan at the time ruled out acute process-Lung nodule noted at that time  Admit 02/11/11 with Afib and RVR-dizzyness--had per note TEE and cardioversion 12/2010 at [facility] in [place], [place]  Admit 12/26 c CP and Afib  Admit 03/17/2011-NOted to be homeless- c Afib  Admit 03/20/11 with Afib i know this patient as of that admission  Consultants:  Cardiology Procedures:  Chest x-ray 1 view 12/01/2011 = MPC mouth, minimally blunted right costophrenic angle with potential small right pleural effusion Antibiotics:  None  Discharge Exam: Filed Vitals:   12/03/11 0300 12/03/11 0400 12/03/11 0500 12/03/11 0600  BP: 94/69 107/65 117/76 116/84  Pulse: 55 76 67 79    Temp:  97.5 F (36.4 C)    TempSrc:  Oral    Resp: 17 18 19 18   Height:      Weight:      SpO2: 97% 97% 97% 97%   Alert oriented in NAD-Seems Depressed  CTA B , no rails no rhonchi  S1 S2 irreg irreg-no noted -telemetry reviewed  Abd soft nt nd  LE's soft no edema  Discharge Instructions  Discharge Orders    Future Orders Please Complete By Expires   Diet - low sodium heart healthy      Increase activity slowly      Call MD for:  persistant nausea and vomiting      Call MD for:  severe uncontrolled pain      Call MD for:  difficulty breathing, headache or visual disturbances      Call MD for:  persistant dizziness or light-headedness      Call MD for:  extreme fatigue          Medication List     As of 12/03/2011  7:46 AM    TAKE these medications         metoprolol tartrate 25 MG tablet   Commonly known as: LOPRESSOR   Take 3 tablets (75 mg total) by mouth 2 (two) times daily.  nicotine 14 mg/24hr patch   Commonly known as: NICODERM CQ - dosed in mg/24 hours   Place 1 patch onto the skin daily.      Rivaroxaban 20 MG Tabs   Commonly known as: XARELTO   Take 1 tablet (20 mg total) by mouth daily.      traZODone 50 MG tablet   Commonly known as: DESYREL   Take 1 tablet (50 mg total) by mouth at bedtime as needed for sleep (insomnia).      zolpidem 10 MG tablet   Commonly known as: AMBIEN   Take 1 tablet (10 mg total) by mouth at bedtime as needed.           The results of significant diagnostics from this hospitalization (including imaging, microbiology, ancillary and laboratory) are listed below for reference.    Significant Diagnostic Studies: Dg Chest 2 View  12/02/2011  *RADIOLOGY REPORT*  Clinical Data: Shortness of breath.  CHEST - 2 VIEW  Comparison: 12/01/2011  Findings: Stable mild hyperinflation.  Mild peribronchial thickening, stable.  No confluent opacities or effusions.  No acute bony abnormality.  Heart is normal size.  IMPRESSION:  Stable COPD.  No active disease.   Original Report Authenticated By: Cyndie Chime, M.D.    Dg Chest Portable 1 View  12/01/2011  *RADIOLOGY REPORT*  Clinical Data: Shortness of breath.  Tachycardia.  PORTABLE CHEST - 1 VIEW  Comparison: 11/03/2011  Findings: Heart size in the upper normal range.  Emphysema noted. The lungs appear otherwise clear.  Minimally blunted right lateral costophrenic angle observed.  IMPRESSION:  1.  Emphysema. 2.  Minimally blunted right costophrenic angle could conceivably represent a small right pleural effusion.   Original Report Authenticated By: Dellia Cloud, M.D.    Dg Chest Portable 1 View  11/03/2011  *RADIOLOGY REPORT*  Clinical Data: Atrial fibrillation, hypotension, shortness of breath  PORTABLE CHEST - 1 VIEW  Comparison: 03/20/2011  Findings: Chronic interstitial markings/emphysematous changes.  No pleural effusion or pneumothorax.  Borderline cardiomegaly.  IMPRESSION: No evidence of acute cardiopulmonary disease.  Chronic interstitial markings/emphysematous changes.  Borderline cardiomegaly.   Original Report Authenticated By: Charline Bills, M.D.     Microbiology: Recent Results (from the past 240 hour(s))  MRSA PCR SCREENING     Status: Normal   Collection Time   12/01/11  8:24 PM      Component Value Range Status Comment   MRSA by PCR NEGATIVE  NEGATIVE Final      Labs: Basic Metabolic Panel:  Lab 12/02/11 3244 12/01/11 1415  NA 138 138  K 4.4 4.5  CL 104 99  CO2 23 24  GLUCOSE 96 90  BUN 25* 25*  CREATININE 0.91 1.16  CALCIUM 8.5 9.8  MG -- --  PHOS -- --   Liver Function Tests:  Lab 12/01/11 1415  AST 45*  ALT 18  ALKPHOS 112  BILITOT 0.7  PROT 7.3  ALBUMIN 4.2   No results found for this basename: LIPASE:5,AMYLASE:5 in the last 168 hours No results found for this basename: AMMONIA:5 in the last 168 hours CBC:  Lab 12/02/11 0231 12/01/11 1415  WBC 7.2 10.0  NEUTROABS -- --  HGB 14.1 16.1  HCT 41.0 46.5  MCV  91.1 91.0  PLT 153 182   Cardiac Enzymes:  Lab 12/02/11 0231 12/01/11 2148  CKTOTAL -- --  CKMB -- --  CKMBINDEX -- --  TROPONINI <0.30 <0.30   BNP: BNP (last 3 results)  Basename 11/03/11 1713  03/21/11 0204  PROBNP 1294.0* 528.2*   CBG:  Lab 12/02/11 0753  GLUCAP 98    Time coordinating discharge: 45 minutes  Signed:  Rhetta Mura  [facility] 12/03/2011, 7:26 AM

## 2011-12-03 NOTE — Progress Notes (Signed)
Per psych MD, Pt has capacity.  Per psych MD, Pt denied ETOH use.  Per psych MD, no psych CSW needs identified.  Per Unit CSW, no psych CSW needs identified, as Pt denies ETOH abuse.  No further psych needs identified.  Psych CSW to sign off.  Providence Crosby, LCSWA Clinical Social Work [phone]

## 2011-12-03 NOTE — Consult Note (Signed)
Patient Identification:  [name] Date of Evaluation:  12/03/2011 Reason for Consult:  Atrial  Fibrillation, nonadherence to medical treatment, evaluate capacity. Referring Provider: Dr Mahala Menghini History of Present Illness: Patient reports that he had exerted himself. He was extremely fatigued and unable to sleep for 2 days. He noticed he was developing chest pressure and came to the ED. He has had a series of admissions related to chest pain and atrial fibrillation. He also has a history of excessive drinking.  Past Psychiatric History alcohol dependence, admission to [facility] 10/2009: History of depression, tobacco dependence, admission for alcohol and depression 10/11 on 2 subsequent admissions for atrial fibrillation.: Poor adherence to treatment regimen  Past Medical History:     Past Medical History  Diagnosis Date  . Atrial fibrillation     diagnosed 2010  . Angina   . Hepatitis     hepatitis B-never treated-Titers were low  . Hypertension   . 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 03/21/2011 - 2D Echo: EF 25% to 30%. Diffuse hypokinesis.  Marland Kitchen HEMORRHOIDS 01/17/2009  . DEPRESSION 01/17/2009  . ALCOHOL ABUSE 01/18/2009       Past Surgical History  Procedure Date  . Mouth surgery     Allergies: No Known Allergies  Current Medications:  Prior to Admission medications   Medication Sig Start Date End Date Taking? Authorizing Provider  metoprolol tartrate (LOPRESSOR) 25 MG tablet Take 3 tablets (75 mg total) by mouth 2 (two) times daily. 12/03/11   Rhetta Mura, MD  nicotine (NICODERM CQ - DOSED IN MG/24 HOURS) 14 mg/24hr patch Place 1 patch onto the skin daily. 12/03/11   Rhetta Mura, MD  Rivaroxaban (XARELTO) 20 MG TABS Take 1 tablet (20 mg total) by mouth daily. 12/03/11   Rhetta Mura, MD  traZODone (DESYREL) 150 MG tablet Take 1 tablet (150 mg total) by mouth at bedtime as needed for sleep. 03/27/11 04/26/11  Marinda Elk, MD  traZODone  (DESYREL) 50 MG tablet Take 1 tablet (50 mg total) by mouth at bedtime as needed for sleep (insomnia). 12/03/11   Rhetta Mura, MD  zolpidem (AMBIEN) 10 MG tablet Take 1 tablet (10 mg total) by mouth at bedtime as needed. 12/03/11   Rhetta Mura, MD    Social History:    reports that he has been smoking Cigarettes.  He has a 40 pack-year smoking history. He does not have any smokeless tobacco history on file. He reports that he drinks about 10.8 ounces of alcohol per week. He reports that he does not use illicit drugs.   Family History:    Family History  Problem Relation Age of Onset  . Heart disease Neg Hx   . 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 Alcohol, nicotine dependence   AXIS II  Deffered  AXIS III See medical notes.  AXIS IV economic problems, housing problems,  other psychosocial or environmental problems and problems related to social environment  AXIS V 41-50 serious symptoms   Assessment/Plan: Discussed with Dr Mahala Menghini, Psych CSW Patient states he has plans to be designation. Information on outpatient resources as provided by the social worker. He has capacity to make decisions but has poor insight regarding his compulsive drinking and smoking-these effects on his health which is both cardiac and pulmonary compromised.  Patient is a frequent visitor to both the medical services, inpatient and  psychiatric facility without significant change in patterns of behavior. He has poor insight and reports that he has limited financial resources. It may be a significant help and protection for this patient if he is referred to the ACT team. He has the capacity and announces that he has made arrangements to live with acquaintances rather than seek assistance from a nursing home facility. RECOMMENDATION:  1. Patient demonstrates he has capacity to arrange for his housing and has orientation to manage to get from one place to another; noting that he requests a bus pass rather than a taxi voucher. 2.  Patient is going to live with an acquaintance.; He declines outpatient psychiatric care. 3.  Patient is discharged with Ambien for sleep and trazodone 2 regulate mood. 4. No further psychiatric needs identified; M.D. Psychiatrist signs off Jeffrey Fors J. Ferol Luz, MD Psychiatrist  12/03/2011. 11:22 PM

## 2011-12-03 NOTE — Progress Notes (Signed)
Asked by psych MD to provide Pt with outpt tx information for mental health.  Met with Pt to discuss outpt tx.  Pt states that he has no money for his meds and states that he will d/c today and likely be back in the hospital in 2 days.  Pt reports that he recently moved into an apartment and that he spent all of his $ related to the move.  Pt reports concerns regarding his heart and states that he had to walk 20 miles a couple of days ago while his heart was out of rhythm so that he could eat.  Pt states that he only gets $16 per month in food stamps, so he was forced to walk to a family member's house for some money to buy food.  He states that this exacerbated his already out-of-rhythm heart and that this landed him in the hospital.  CSW provided Pt with outpt tx information.  Discussed with Pt using [employer] transportation for his transportation needs.  Pt stated that he understands this services but expressed frustration with having to give them 24hrs notice.  RN and RNCM stating that Pt needs a taxi voucher.  Upon providing Pt with a taxi voucher, Pt requested a bus pass instead.  Pt stated that he'd rather walk and needed to "move around."  CSW discussed with Pt his earlier concerns re: his heart and getting it out of rhythm.  Pt stated that he was unconcerned about this and requested, again, a bus pass.  CSW provided Pt with a bus pass.  CSW thanked Pt for his time.  Providence Crosby, LCSWA Clinical Social Work [phone]

## 2011-12-03 NOTE — Clinical Social Work Psychosocial (Addendum)
Clinical Social Work Department BRIEF PSYCHOSOCIAL ASSESSMENT 12/03/2011  Patient:  [name], [name]     Account Number:  [id number]     Admit date:  12/01/2011  Clinical Social Worker:  Jodelle Red  Date/Time:  12/03/2011 11:20 AM  Referred by:  CSW  Date Referred:  12/03/2011 Referred for  Other - See comment   Other Referral:   Interview type:  Patient Other interview type:   CHART REVIEW, DISCUSSION WITH RN CM AND RN    PSYCHOSOCIAL DATA Living Status:  ALONE Admitted from facility:   Level of care:   Primary support name:  ANN HORNER Primary support relationship to patient:  SIBLING Degree of support available:   LIMITED. PT HAS DAUGHTER, SISTER AND SON THAT HELP SOME. PER PT, THEY ALSO HAVE FINANCIAL ISSUES.    CURRENT CONCERNS Current Concerns  Financial Resources  Adjustment to Illness   Other Concerns:    SOCIAL WORK ASSESSMENT / PLAN CSW met with Pt as psych consult was just for capacity per psych csw. Pt recently moved to an apartment here in [place] and was homeless until a few weeks ago. Pt reports to have issues with transportation, lack of furniture, food and managing his medications. Pt has insurance and medicaid that provides for low copays for his meds. Pt receives $16 a month for food stamps. CSW educated Pt on how to access medicaid transportation for doctor appts.  Pt appears stressed and anxious. Reported trouble sleeping due to all his stressors. Pt declines current concerns with ETOH even though he had high blood ETOH on this admission.   Assessment/plan status:  Referral to [facility] Other assessment/ plan:   CSW made referral to community case management and they plan to see if they can follow.   Information/referral to community resources:   Printmaker for Pharmacist, hospital.   Taxi ride home if needed for d/c.    PATIENT'S/FAMILY'S RESPONSE TO PLAN OF CARE: Pt appreciative with what little  resources could assist with. Pt denies wanting assistance with ETOH. Aware of need to follow through on medications. He may have to choose what is most important to him.  CSW can help with transportation home as needed.    Doreen Salvage, LCSWA ICU/Stepdown Clinical Social Worker [facility] Cell [phone] Hours 8am-1200pm M-F

## 2011-12-12 ENCOUNTER — Encounter: Payer: Self-pay | Admitting: Cardiovascular Disease

## 2012-07-09 HISTORY — PX: COLON SURGERY: SHX602

## 2012-07-28 ENCOUNTER — Ambulatory Visit: Payer: Medicare Other | Attending: Internal Medicine | Admitting: Internal Medicine

## 2012-07-28 ENCOUNTER — Emergency Department (HOSPITAL_COMMUNITY)
Admission: EM | Admit: 2012-07-28 | Discharge: 2012-07-28 | Disposition: A | Discharge status: Home / Self Care | Payer: Medicare Other | Attending: Emergency Medicine | Admitting: Emergency Medicine

## 2012-07-28 ENCOUNTER — Encounter (HOSPITAL_COMMUNITY): Payer: Self-pay | Admitting: Emergency Medicine

## 2012-07-28 VITALS — BP 116/77 | HR 70 | Temp 98.3°F | Resp 17 | Wt 200.0 lb

## 2012-07-28 DIAGNOSIS — F17200 Nicotine dependence, unspecified, uncomplicated: Secondary | ICD-10-CM | POA: Insufficient documentation

## 2012-07-28 DIAGNOSIS — T8189XA Other complications of procedures, not elsewhere classified, initial encounter: Secondary | ICD-10-CM | POA: Insufficient documentation

## 2012-07-28 DIAGNOSIS — I10 Essential (primary) hypertension: Secondary | ICD-10-CM | POA: Insufficient documentation

## 2012-07-28 DIAGNOSIS — Z8679 Personal history of other diseases of the circulatory system: Secondary | ICD-10-CM | POA: Insufficient documentation

## 2012-07-28 DIAGNOSIS — Z9889 Other specified postprocedural states: Secondary | ICD-10-CM | POA: Insufficient documentation

## 2012-07-28 DIAGNOSIS — Z8719 Personal history of other diseases of the digestive system: Secondary | ICD-10-CM | POA: Insufficient documentation

## 2012-07-28 DIAGNOSIS — Z79899 Other long term (current) drug therapy: Secondary | ICD-10-CM | POA: Insufficient documentation

## 2012-07-28 DIAGNOSIS — Y833 Surgical operation with formation of external stoma as the cause of abnormal reaction of the patient, or of later complication, without mention of misadventure at the time of the procedure: Secondary | ICD-10-CM | POA: Insufficient documentation

## 2012-07-28 DIAGNOSIS — C189 Malignant neoplasm of colon, unspecified: Secondary | ICD-10-CM

## 2012-07-28 DIAGNOSIS — Z7982 Long term (current) use of aspirin: Secondary | ICD-10-CM | POA: Insufficient documentation

## 2012-07-28 DIAGNOSIS — Z85038 Personal history of other malignant neoplasm of large intestine: Secondary | ICD-10-CM | POA: Insufficient documentation

## 2012-07-28 DIAGNOSIS — T8130XA Disruption of wound, unspecified, initial encounter: Secondary | ICD-10-CM

## 2012-07-28 DIAGNOSIS — Z7901 Long term (current) use of anticoagulants: Secondary | ICD-10-CM | POA: Insufficient documentation

## 2012-07-28 DIAGNOSIS — Z8619 Personal history of other infectious and parasitic diseases: Secondary | ICD-10-CM | POA: Insufficient documentation

## 2012-07-28 DIAGNOSIS — I4891 Unspecified atrial fibrillation: Secondary | ICD-10-CM | POA: Insufficient documentation

## 2012-07-28 DIAGNOSIS — F329 Major depressive disorder, single episode, unspecified: Secondary | ICD-10-CM | POA: Insufficient documentation

## 2012-07-28 DIAGNOSIS — IMO0002 Reserved for concepts with insufficient information to code with codable children: Secondary | ICD-10-CM | POA: Insufficient documentation

## 2012-07-28 DIAGNOSIS — F3289 Other specified depressive episodes: Secondary | ICD-10-CM | POA: Insufficient documentation

## 2012-07-28 DIAGNOSIS — IMO0001 Reserved for inherently not codable concepts without codable children: Secondary | ICD-10-CM

## 2012-07-28 DIAGNOSIS — I509 Heart failure, unspecified: Secondary | ICD-10-CM | POA: Insufficient documentation

## 2012-07-28 NOTE — ED Provider Notes (Signed)
History     CSN: [id number]  Arrival date & time 07/28/12  1059   First MD Initiated Contact with Patient 07/28/12 1204      Chief Complaint  Patient presents with  . Post-op Problem    (Consider location/radiation/quality/duration/timing/severity/associated sxs/prior treatment) The history is provided by the patient.  pt indicates 2-3 weeks ago at [facility] had partial colectomy re colon ca. Uneventful surgery and postop course. Pt indicates had staples out 1-2 weeks ago. In past week had developed a slightly sore, swollen area over inferior aspect of incision, which has drained a small amt yellowish material. Was seen by pcp today and referred to ed for possible gen surg eval. Pt denies any abdominal pain. Is eating and drinking normally. Having regular bms. Denies feeling sick or ill. No nv. No fever or chills.   Past Medical History  Diagnosis Date  . Atrial fibrillation     diagnosed 2010  . Angina   . Hepatitis     hepatitis B-never treated-Titers were low  . Hypertension   . 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 03/21/2011 - 2D Echo: EF 25% to 30%. Diffuse hypokinesis.  Marland Kitchen HEMORRHOIDS 01/17/2009  . DEPRESSION 01/17/2009  . ALCOHOL ABUSE 01/18/2009  . Cancer     Past Surgical History  Procedure Laterality Date  . Mouth surgery    . Small intestine surgery    . Colon surgery      Family History  Problem Relation Age of Onset  . Heart disease Neg Hx   . Asthma Mother     History  Substance Use Topics  . Smoking status: Current Every Day Smoker -- 1.00 packs/day for 40 years    Types: Cigarettes  . Smokeless tobacco: Not on file  . Alcohol Use: 10.8 oz/week    18 Cans of beer per week     Comment: heavy drinker-drank last wednesday drank about 4 beers spaced out over 5 hrs      Review of Systems  Constitutional: Negative for fever and chills.  Gastrointestinal: Negative for nausea, vomiting and abdominal pain.  Skin: Positive for  wound.    Allergies  Review of patient's allergies indicates no known allergies.  Home Medications   Current Outpatient Rx  Name  Route  Sig  Dispense  Refill  . amiodarone (PACERONE) 200 MG tablet   Oral   Take 200 mg by mouth daily.         Marland Kitchen aspirin 81 MG tablet   Oral   Take 81 mg by mouth daily.         . carvedilol (COREG) 3.125 MG tablet   Oral   Take 3.125 mg by mouth 2 (two) times daily with a meal.         . furosemide (LASIX) 20 MG tablet   Oral   Take 20 mg by mouth daily.         Marland Kitchen LORazepam (ATIVAN) 2 MG tablet   Oral   Take 2 mg by mouth every 6 (six) hours as needed for anxiety.         . metoprolol tartrate (LOPRESSOR) 25 MG tablet   Oral   Take 3 tablets (75 mg total) by mouth 2 (two) times daily.   90 tablet   6   . Multiple Vitamin (MULTIVITAMIN) capsule   Oral   Take 1 capsule by mouth daily.         . nicotine (NICODERM  CQ - DOSED IN MG/24 HOURS) 14 mg/24hr patch   Transdermal   Place 1 patch onto the skin daily.   28 patch   0   . oxyCODONE-acetaminophen (PERCOCET/ROXICET) 5-325 MG per tablet   Oral   Take 1 tablet by mouth every 6 (six) hours as needed for pain.         . polyethylene glycol (MIRALAX / GLYCOLAX) packet   Oral   Take 17 g by mouth daily.         . Rivaroxaban (XARELTO) 20 MG TABS   Oral   Take 1 tablet (20 mg total) by mouth daily.   30 tablet   0   . EXPIRED: traZODone (DESYREL) 150 MG tablet   Oral   Take 1 tablet (150 mg total) by mouth at bedtime as needed for sleep.   30 tablet   0   . traZODone (DESYREL) 50 MG tablet   Oral   Take 1 tablet (50 mg total) by mouth at bedtime as needed for sleep (insomnia).   30 tablet   0   . zolpidem (AMBIEN) 10 MG tablet   Oral   Take 1 tablet (10 mg total) by mouth at bedtime as needed.   30 tablet   0     BP 98/74  Pulse 68  Temp(Src) 97.9 F (36.6 C) (Oral)  Resp 16  SpO2 100%  Physical Exam  Nursing note and vitals  reviewed. Constitutional: He is oriented to person, place, and time. He appears well-developed and well-nourished. No distress.  HENT:  Mouth/Throat: Oropharynx is clear and moist.  Eyes: Conjunctivae are normal. No scleral icterus.  Neck: Neck supple. No tracheal deviation present.  Cardiovascular: Normal rate.   Pulmonary/Chest: Effort normal. No accessory muscle usage. No respiratory distress.  Abdominal: Soft. Bowel sounds are normal. He exhibits no distension and no mass. There is no tenderness. There is no rebound and no guarding.  Healing midline, lower abd surgical incision. Over inferior aspect incision approximately 2-3 cm diameter area fluctuance, mild tenderness, draining small amt yellowish material through pinhole sized opening. No abd wall cellulitis. No incarc hernia.   Musculoskeletal: Normal range of motion.  Neurological: He is alert and oriented to person, place, and time.  Skin: Skin is warm and dry.  Psychiatric: He has a normal mood and affect.    ED Course  Procedures (including critical care time)     MDM  Small amount yellowish drainage via open/pinhole wound over lower aspect of midline incision, overlying 2-3 cm diameter area fluctuance.   Using sterile cotton tip application skin gently opened approximately 1 cm to allow for drainage subcutaneous abscess. Deeper st/incision intact.   gen surg called to facilitate follow up.   gen surg indicates will see in ed, and follow up as outpt.       Suzi Roots, MD 07/28/12 1235

## 2012-07-28 NOTE — Progress Notes (Signed)
Subjective: Ask to see by the ER Physician.  67 y/o who had Cardiac surgery in [place] and was found to have colon cancer.  It sounds like possible increased bleeding, previously on Xarelto for afib.  He underwent some form of partial colectomy about 3 weeks ago, he was unsure of the details.  He was transferred to a SNF for rehab and was discharged to home on 07/24/12.  [place] and then came back to [facility] yesterday, where he is going to reside again with his ex-wife.  He was told he does not need to follow up with Surgery in  [place] (Dr. Charline Bills) and to follow up with a primary care MD once he got back to [facility].  He saw Dr. Isidoro Donning [facility]) and had a small seroma at the base of his incision.  It had already been opened by the ER staff.  I probed it with a sterile applicator, and it was superficial with about a 1 cm length.  No drainage currently, it was clean and pink.  I use a sterile suture removal sicssors to open the wound it's length, 1 cm, and showed him how to pack it wet to dry with NS and an open 2 x 2, and NS which the ER staff was going to give him. He can follow up with our office as a new patient or he can go back to [employer].  Objective: Vital signs in last 24 hours: Temp:  [97.9 F (36.6 C)-98.3 F (36.8 C)] 97.9 F (36.6 C) (05/20 1108) Pulse Rate:  [68-70] 68 (05/20 1108) Resp:  [16-17] 16 (05/20 1108) BP: (98-116)/(74-77) 98/74 mmHg (05/20 1108) SpO2:  [99 %-100 %] 100 % (05/20 1108) Weight:  [90.719 kg (200 lb)] 90.719 kg (200 lb) (05/20 1019)    PMH:  Chest pain with hospitalization 11/2011:  Afib/RVR with poor medicine compliance. 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 Records from [place] are not currently available..   Review of  Systems  Constitutional: Negative.   HENT: Negative.   Eyes: Negative.   Respiratory: Negative.        Still smoking  Cardiovascular: Negative.        No cp or palpations since his CABG surgery last year.  Gastrointestinal: Negative.        Has some hemorrhoids he wants attended to, and I told him he could do this as an outpatient.   Genitourinary: Negative.   Musculoskeletal: Negative.   Skin:       Open area at the base of incision.  He's not sure how long the fluid collection that was drained already was there.  Neurological: Negative.   Psychiatric/Behavioral: Negative.     Family History  Problem Relation Age of Onset  . Heart disease Neg Hx   . Asthma Mother    SH:  Living with ex wife, ETOH:  None currently  Drugs:  None  Tobacco:  Ongoing use. . Prior to Admission medications   Medication Sig Start Date End Date Taking? Authorizing Provider  amiodarone (PACERONE) 200 MG tablet Take 200 mg by mouth daily.   Yes Historical Provider, MD  aspirin 81 MG tablet Take 81 mg by mouth daily.   Yes Historical Provider, MD  carvedilol (COREG) 3.125 MG tablet Take 3.125 mg by mouth 2 (two) times  daily with a meal.   Yes Historical Provider, MD  furosemide (LASIX) 20 MG tablet Take 20 mg by mouth daily.   Yes Historical Provider, MD  LORazepam (ATIVAN) 2 MG tablet Take 2 mg by mouth at bedtime as needed (for sleep).    Yes Historical Provider, MD  Multiple Vitamin (MULTIVITAMIN) capsule Take 1 capsule by mouth daily.   Yes Historical Provider, MD  oxyCODONE-acetaminophen (PERCOCET/ROXICET) 5-325 MG per tablet Take 1 tablet by mouth every 6 (six) hours as needed for pain.   Yes Historical Provider, MD  polyethylene glycol (MIRALAX / GLYCOLAX) packet Take 17 g by mouth daily.   Yes Historical Provider, MD    General appearance: alert, cooperative and no distress Head: Normocephalic, without obvious abnormality, atraumatic Eyes: conjunctivae/corneas clear. PERRL, EOM's intact. Fundi  benign. Resp: clear to auscultation bilaterally Chest wall: no tenderness, will healed stenotomy Cardio: regular rate and rhythm, S1, S2 normal, no murmur, click, rub or gallop GI: soft, non-tender; bowel sounds normal; no masses,  no organomegaly and well healing mid line incision, below the umbilicus.  It is about 7 cm long.  At the base is a 1 cm long opened site, looks and described as a seroma, by ER staff.  It was clean and pink, no fluid when I saw him.  It was supercicial and I opened it the length with sterile suture removal suture scissors.  It did not hurt and required no local anesthesia.. Packed wet to dry with open 2 x 2.  i Extremities: extremities normal, atraumatic, no cyanosis or edema  Assessment/Plan Plan:  I told him how to do wet to dry dressing bid at home using saline and 2x2.  He can follow up with office for this if he needs to or with his surgeon in [place].  He can also follow up as a new patient in our office if her wants his hemrrhoids to have further evaluation.  Primary care and Oncology follow up per Dr. Isidoro Donning.      LOS: 0 days    [name] 07/28/2012

## 2012-07-28 NOTE — ED Notes (Signed)
Pt reports colon resection sx 2 weeks ago at [facility]. Pt reports having staples removed 2 weeks ago. Now with open wound on abdomen sent here from urgent care for further care.

## 2012-07-28 NOTE — Progress Notes (Signed)
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

## 2012-07-28 NOTE — ED Notes (Signed)
Patient said he had surgery two weeks ago for a colon resection.  His staples were taken out a week and a half ago and everything looks like it is healing right except a small area at the base of the scar.  It is red, and has some exudate.  Patient said the scar does not hurt, but having a BM does.  He also has a small traces of blood on his stool which he was told is normal.

## 2012-07-28 NOTE — Progress Notes (Signed)
Patient ID: [name], male   DOB: 11-27-1945, 67 y.o.   MRN: [id number] Patient Demographics  [name], is a 67 y.o. male  YQM:[id number]  BMW:[id number]  DOB - Sep 15, 1945  Chief Complaint  Patient presents with  . Establish Care        Subjective:   [name] is here to establish primary care. Patient has No headache, No chest pain, No abdominal pain - No Nausea, No new weakness tingling or numbness, No Cough - SOB.  Patient reports that he was in [facility] for last 8 months for a job and was diagnosed with colon cancer. He underwent colon surgery 2 weeks ago, surgeon Dr. Bluford Kaufmann (?) At [facility], [place]. He returned to [facility] yesterday and wants to establish care. Patient also noticed that in the area of last 3 incisions above the suprapubic region/lower abdomen, he has about 4 cm indurated area which has been draining yellowish serosanguineous fluid. Patient states that he had noticed similar indurated area on right side of incision which has improved.     Objective:    Filed Vitals:   07/28/12 1019  BP: 116/77  Pulse: 70  Temp: 98.3 F (36.8 C)  Resp: 17  Weight: 200 lb (90.719 kg)  SpO2: 99%     ALLERGIES:  No Known Allergies  PAST MEDICAL HISTORY: Past Medical History  Diagnosis Date  . Atrial fibrillation     diagnosed 2010  . Angina   . Hepatitis     hepatitis B-never treated-Titers were low  . Hypertension   . 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 03/21/2011 - 2D Echo: EF 25% to 30%. Diffuse hypokinesis.  Marland Kitchen HEMORRHOIDS 01/17/2009  . DEPRESSION 01/17/2009  . ALCOHOL ABUSE 01/18/2009    PAST SURGICAL HISTORY: Past Surgical History  Procedure Laterality Date  . Mouth surgery    . Small intestine surgery    . Colon surgery      FAMILY HISTORY: Family History  Problem Relation Age of Onset  . Heart disease Neg Hx   . Asthma Mother     MEDICATIONS AT HOME: Prior to  Admission medications   Medication Sig Start Date End Date Taking? Authorizing Provider  amiodarone (PACERONE) 200 MG tablet Take 200 mg by mouth daily.   Yes Historical Provider, MD  aspirin 81 MG tablet Take 81 mg by mouth daily.   Yes Historical Provider, MD  carvedilol (COREG) 3.125 MG tablet Take 3.125 mg by mouth 2 (two) times daily with a meal.   Yes Historical Provider, MD  furosemide (LASIX) 20 MG tablet Take 20 mg by mouth daily.   Yes Historical Provider, MD  LORazepam (ATIVAN) 2 MG tablet Take 2 mg by mouth every 6 (six) hours as needed for anxiety.   Yes Historical Provider, MD  Multiple Vitamin (MULTIVITAMIN) capsule Take 1 capsule by mouth daily.   Yes Historical Provider, MD  oxyCODONE-acetaminophen (PERCOCET/ROXICET) 5-325 MG per tablet Take 1 tablet by mouth every 6 (six) hours as needed for pain.   Yes Historical Provider, MD  polyethylene glycol (MIRALAX / GLYCOLAX) packet Take 17 g by mouth daily.   Yes Historical Provider, MD  metoprolol tartrate (LOPRESSOR) 25 MG tablet Take 3 tablets (75 mg total) by mouth 2 (two) times daily. 12/03/11   Rhetta Mura, MD  nicotine (NICODERM CQ - DOSED IN MG/24 HOURS) 14 mg/24hr patch Place 1 patch onto the skin daily. 12/03/11   Rhetta Mura,  MD  Rivaroxaban (XARELTO) 20 MG TABS Take 1 tablet (20 mg total) by mouth daily. 12/03/11   Rhetta Mura, MD  traZODone (DESYREL) 150 MG tablet Take 1 tablet (150 mg total) by mouth at bedtime as needed for sleep. 03/27/11 04/26/11  Marinda Elk, MD  traZODone (DESYREL) 50 MG tablet Take 1 tablet (50 mg total) by mouth at bedtime as needed for sleep (insomnia). 12/03/11   Rhetta Mura, MD  zolpidem (AMBIEN) 10 MG tablet Take 1 tablet (10 mg total) by mouth at bedtime as needed. 12/03/11   Rhetta Mura, MD    REVIEW OF SYSTEMS:  Constitutional:   No   Fevers, chills, fatigue.  HEENT:    No headaches, Sore throat,   Cardio-vascular: No chest pain,  Orthopnea,  swelling in lower extremities, anasarca, palpitations  GI:  No abdominal pain, nausea, vomiting, diarrhea  Resp: No shortness of breath,  No coughing up of blood.No cough.No wheezing.  Skin:  See history of present illness  GU:  no dysuria, change in color of urine, no urgency or frequency.  No flank pain.  Musculoskeletal: No joint pain or swelling.  No decreased range of motion.  No back pain.  Psych: No change in mood or affect. No depression or anxiety.  No memory loss.   Exam  General appearance :Awake, alert, NAD, Speech Clear. HEENT: Atraumatic and Normocephalic, PERLA Neck: supple, no JVD. No cervical lymphadenopathy.  Chest: clear to auscultation bilaterally, no wheezing, rales or rhonchi CVS: S1 S2 regular, no murmurs.  Abdomen: soft, NBS,  ND, no gaurding, rigidity or rebound. Incisions on the lower abdomen, indurated erythamatous area about 3-4 cm with a yellowish serosanguineous drainage Extremities: No cyanosis, clubbing, B/L Lower Ext shows no edema,  Neurology: Awake alert, and oriented X 3, CN II-XII intact, Non focal Skin: Please abdominal exam Wounds: N/A    Data Review   Basic Metabolic Panel: No results found for this basename: NA, K, CL, CO2, GLUCOSE, BUN, CREATININE, CALCIUM, MG, PHOS,  in the last 168 hours Liver Function Tests: No results found for this basename: AST, ALT, ALKPHOS, BILITOT, PROT, ALBUMIN,  in the last 168 hours  CBC: No results found for this basename: WBC, NEUTROABS, HGB, HCT, MCV, PLT,  in the last 168 hours ------------------------------------------------------------------------------------------------------------------ No results found for this basename: HGBA1C,  in the last 72 hours ------------------------------------------------------------------------------------------------------------------ No results found for this basename: CHOL, HDL, LDLCALC, TRIG, CHOLHDL, LDLDIRECT,  in the last 72  hours ------------------------------------------------------------------------------------------------------------------ No results found for this basename: TSH, T4TOTAL, FREET3, T3FREE, THYROIDAB,  in the last 72 hours ------------------------------------------------------------------------------------------------------------------ No results found for this basename: VITAMINB12, FOLATE, FERRITIN, TIBC, IRON, RETICCTPCT,  in the last 72 hours  Coagulation profile  No results found for this basename: INR, PROTIME,  in the last 168 hours    Assessment & Plan   Active Problems: 1) colon cancer status post colon surgery, postoperative with Unhealing wound and drainage - will transfer patient to [facility] ED, he may need I&D and possibly a surgeon to look at the wound, antibiotics - He will need all records to be transferred from [facility], oncology referral here at [facility] after the the acute issue with the wound is taken care off.   2) history of A. fib, currently rate controlled: -  continue xarelto, amiodarone, Coreg  3) chronic CHF: Currently stable and compensated -  Continue Lasix, baby aspirin - Patient states that he has all the medication and does not need  refills  4) Tobacco abuse: Patient counseled against smoking  Recommendation: Followup in 1 weeks. Obtain records from [facility].   Latoiya Maradiaga M.D. 07/28/2012, 10:53 AM

## 2012-08-07 ENCOUNTER — Encounter (INDEPENDENT_AMBULATORY_CARE_PROVIDER_SITE_OTHER): Payer: Self-pay | Admitting: Surgery

## 2012-08-07 ENCOUNTER — Ambulatory Visit (INDEPENDENT_AMBULATORY_CARE_PROVIDER_SITE_OTHER): Payer: Medicare Other | Admitting: Surgery

## 2012-08-07 VITALS — BP 118/74 | HR 64 | Temp 98.4°F | Resp 18 | Ht 78.0 in | Wt 198.2 lb

## 2012-08-07 DIAGNOSIS — IMO0001 Reserved for inherently not codable concepts without codable children: Secondary | ICD-10-CM

## 2012-08-07 DIAGNOSIS — K649 Unspecified hemorrhoids: Secondary | ICD-10-CM

## 2012-08-07 DIAGNOSIS — T8149XA Infection following a procedure, other surgical site, initial encounter: Secondary | ICD-10-CM | POA: Insufficient documentation

## 2012-08-07 NOTE — Progress Notes (Signed)
This is a followup for [name] who was seen in the ED. He had a coronary artery bypass surgery down in [place] and after that has been out of atrial fibrillation. He was seen in the ED for some hemorrhoids and a delayted healing incision from a recent colon cancer operation done on May 1.  The incision is actually healed fairly well and there's is a little area that we cauterized with silver nitrate. I think you're having looked overall good with his abdominal incision. I suggested that he make contact with the cardiologist here in [place].    His hemorrhoids are no longer bothering him. He declined to let me examine him since her problem. I told him we'll be happy to follow him along as needed.  Return when necessary

## 2012-08-07 NOTE — Patient Instructions (Signed)
Hemorrhoids Hemorrhoids are swollen veins around the rectum or anus. There are two types of hemorrhoids:   Internal hemorrhoids. These occur in the veins just inside the rectum. They may poke through to the outside and become irritated and painful.  External hemorrhoids. These occur in the veins outside the anus and can be felt as a painful swelling or hard lump near the anus. CAUSES  Pregnancy.   Obesity.   Constipation or diarrhea.   Straining to have a bowel movement.   Sitting for long periods on the toilet.  Heavy lifting or other activity that caused you to strain.  Anal intercourse. SYMPTOMS   Pain.   Anal itching or irritation.   Rectal bleeding.   Fecal leakage.   Anal swelling.   One or more lumps around the anus.  DIAGNOSIS  Your caregiver may be able to diagnose hemorrhoids by visual examination. Other examinations or tests that may be performed include:   Examination of the rectal area with a gloved hand (digital rectal exam).   Examination of anal canal using a small tube (scope).   A blood test if you have lost a significant amount of blood.  A test to look inside the colon (sigmoidoscopy or colonoscopy). TREATMENT Most hemorrhoids can be treated at home. However, if symptoms do not seem to be getting better or if you have a lot of rectal bleeding, your caregiver may perform a procedure to help make the hemorrhoids get smaller or remove them completely. Possible treatments include:   Placing a rubber band at the base of the hemorrhoid to cut off the circulation (rubber band ligation).   Injecting a chemical to shrink the hemorrhoid (sclerotherapy).   Using a tool to burn the hemorrhoid (infrared light therapy).   Surgically removing the hemorrhoid (hemorrhoidectomy).   Stapling the hemorrhoid to block blood flow to the tissue (hemorrhoid stapling).  HOME CARE INSTRUCTIONS   Eat foods with fiber, such as whole grains, beans,  nuts, fruits, and vegetables. Ask your doctor about taking products with added fiber in them (fibersupplements).  Increase fluid intake. Drink enough water and fluids to keep your urine clear or pale yellow.   Exercise regularly.   Go to the bathroom when you have the urge to have a bowel movement. Do not wait.   Avoid straining to have bowel movements.   Keep the anal area dry and clean. Use wet toilet paper or moist towelettes after a bowel movement.   Medicated creams and suppositories may be used or applied as directed.   Only take over-the-counter or prescription medicines as directed by your caregiver.   Take warm sitz baths for 15 20 minutes, 3 4 times a day to ease pain and discomfort.   Place ice packs on the hemorrhoids if they are tender and swollen. Using ice packs between sitz baths may be helpful.   Put ice in a plastic bag.   Place a towel between your skin and the bag.   Leave the ice on for 15 20 minutes, 3 4 times a day.   Do not use a donut-shaped pillow or sit on the toilet for long periods. This increases blood pooling and pain.  SEEK MEDICAL CARE IF:  You have increasing pain and swelling that is not controlled by treatment or medicine.  You have uncontrolled bleeding.  You have difficulty or you are unable to have a bowel movement.  You have pain or inflammation outside the area of the hemorrhoids. MAKE SURE YOU:    Understand these instructions.  Will watch your condition.  Will get help right away if you are not doing well or get worse. Document Released: 02/23/2000 Document Revised: 02/12/2012 Document Reviewed: 12/31/2011 ExitCare Patient Information 2014 [employer].  

## 2012-08-10 ENCOUNTER — Ambulatory Visit: Payer: Medicare Other | Attending: Family Medicine | Admitting: Family Medicine

## 2012-08-10 ENCOUNTER — Encounter: Payer: Self-pay | Admitting: Family Medicine

## 2012-08-10 VITALS — BP 135/88 | HR 74 | Temp 97.6°F | Resp 18 | Ht 76.0 in | Wt 200.0 lb

## 2012-08-10 DIAGNOSIS — N529 Male erectile dysfunction, unspecified: Secondary | ICD-10-CM

## 2012-08-10 DIAGNOSIS — I10 Essential (primary) hypertension: Secondary | ICD-10-CM

## 2012-08-10 DIAGNOSIS — I4891 Unspecified atrial fibrillation: Secondary | ICD-10-CM

## 2012-08-10 MED ORDER — FUROSEMIDE 20 MG PO TABS: 20.0000 mg | ORAL_TABLET | Freq: Every day | ORAL | Status: DC

## 2012-08-10 MED ORDER — CARVEDILOL 3.125 MG PO TABS: 3.1250 mg | ORAL_TABLET | Freq: Two times a day (BID) | ORAL | Status: DC

## 2012-08-10 MED ORDER — TADALAFIL 2.5 MG PO TABS: 1.0000 | ORAL_TABLET | Freq: Every day | ORAL | Status: DC

## 2012-08-10 MED ORDER — AMIODARONE HCL 200 MG PO TABS: 200.0000 mg | ORAL_TABLET | Freq: Every day | ORAL | Status: DC

## 2012-08-10 NOTE — Patient Instructions (Signed)
Tadalafil tablets (Cialis) What is this medicine? TADALAFIL (tah DA la fil) is used to treat erection problems in men. It is also used for enlargement of the prostate gland in men, a condition called benign prostatic hyperplasia or BPH. This medicine improves urine flow and reduces BPH symptoms. This medicine can also treat both erection problems and BPH when they occur together. This medicine may be used for other purposes; ask your health care provider or pharmacist if you have questions. What should I tell my health care provider before I take this medicine? They need to know if you have any of these conditions: -bleeding disorders -eye or vision problems, including a rare inherited eye disease called retinitis pigmentosa -anatomical deformation of the penis, Peyronie's disease, or history of priapism (painful and prolonged erection) -heart disease, angina, a history of heart attack, irregular heart beats, or other heart problems -high or low blood pressure -history of blood diseases, like sickle cell anemia or leukemia -history of stomach bleeding -kidney disease -liver disease -stroke -an unusual or allergic reaction to tadalafil, other medicines, foods, dyes, or preservatives -pregnant or trying to get pregnant -breast-feeding How should I use this medicine? Take this medicine by mouth with a glass of water. Follow the directions on the prescription label. You may take this medicine with or without meals. When this medicine is used for erection problems, your doctor may prescribe it to be taken once daily or as needed. If you are taking the medicine as needed, you may be able to have sexual activity 30 minutes after taking it and for up to 36 hours after taking it. Whether you are taking the medicine as needed or once daily, you should not take more than one dose per day. If you are taking this medicine for symptoms of benign prostatic hyperplasia (BPH) or to treat both BPH and an erection  problem, take the dose once daily at about the same time each day. Do not take your medicine more often than directed. Talk to your pediatrician regarding the use of this medicine in children. Special care may be needed. Overdosage: If you think you have taken too much of this medicine contact a poison control center or emergency room at once. NOTE: This medicine is only for you. Do not share this medicine with others. What if I miss a dose? If you are taking this medicine as needed for erection problems, this does not apply. If you miss a dose while taking this medicine once daily for an erection problem, benign prostatic hyperplasia, or both, take it as soon as you remember, but do not take more than one dose per day. What may interact with this medicine? Do not take this medicine with any of the following medications: -nitrates like amyl nitrite, isosorbide dinitrate, isosorbide mononitrate, nitroglycerin -other medicines for erectile dysfunction like avanafil, sildenafil, vardenafil -other tadalafil products (Adcirca)  This medicine may also interact with the following medications: -certain drugs for high blood pressure -certain drugs for the treatment of HIV infection or AIDS -certain drugs used for fungal or yeast infections, like fluconazole, itraconazole, ketoconazole, and voriconazole -certain drugs used for seizures like carbamazepine, phenytoin, and phenobarbital -grapefruit juice -macrolide antibiotics like clarithromycin, erythromycin, troleandomycin -medicines for prostate problems -rifabutin, rifampin or rifapentine This list may not describe all possible interactions. Give your health care provider a list of all the medicines, herbs, non-prescription drugs, or dietary supplements you use. Also tell them if you smoke, drink alcohol, or use illegal drugs. Some items   may interact with your medicine. What should I watch for while using this medicine? If you notice any changes in  your vision while taking this drug, call your doctor or health care professional as soon as possible. Stop using this medicine and call your health care provider right away if you have a loss of sight in one or both eyes. Contact you doctor or health care professional right away if the erection lasts longer than 4 hours or if it becomes painful. This may be a sign of serious problem and must be treated right away to prevent permanent damage. If you experience symptoms of nausea, dizziness, chest pain or arm pain upon initiation of sexual activity after taking this medicine, you should refrain from further activity and call your doctor or health care professional as soon as possible. Do not drink alcohol to excess (examples, 5 glasses of wine or 5 shots of whiskey) when taking this medicine. When taken in excess, alcohol can increase your chances of getting a headache or getting dizzy, increasing your heart rate or lowering your blood pressure. Using this medicine does not protect you or your partner against HIV infection (the virus that causes AIDS) or other sexually transmitted diseases. What side effects may I notice from receiving this medicine? Side effects that you should report to your doctor or health care professional as soon as possible: -allergic reactions like skin rash, itching or hives, swelling of the face, lips, or tongue -breathing problems -changes in hearing -changes in vision -chest pain -erection lasting more than 4 hours -fast, irregular heartbeat -seizures  Side effects that usually do not require medical attention (report to your doctor or health care professional if they continue or are bothersome): -back pain -dizziness -flushing -headache -indigestion -muscle aches -nausea -stuffy or runny nose This list may not describe all possible side effects. Call your doctor for medical advice about side effects. You may report side effects to FDA at 1-800-FDA-1088. Where  should I keep my medicine? Keep out of the reach of children. Store at room temperature between 15 and 30 degrees C (59 and 86 degrees F). Throw away any unused medicine after the expiration date. NOTE: This sheet is a summary. It may not cover all possible information. If you have questions about this medicine, talk to your doctor, pharmacist, or health care provider.  2012, Elsevier/Gold Standard. (08/21/2010 1:19:37 PM) 

## 2012-08-10 NOTE — Progress Notes (Signed)
Patient states he needs refills for Amiodarone, Carvedilol, and Furosemide.

## 2012-08-10 NOTE — Progress Notes (Signed)
Subjective:     Patient ID: [name], male   DOB: 14-Mar-1945, 67 y.o.   MRN: [id number]  HPI Pt here for refills on "heart meds". He had a triple bypass last fall and is 30 days s/p surgery for colon cancer. He has been doing very well in both regards and will not be requiring chemo or radiation for the cancer. He follows with [facility] cardiology.   Pt also asking for script for cialis daily. He was previously prescribed viagra but it cost $31 per pill and he was unable to afford it. He does not take nitrates.   Review of Systems no chest pain, shob, or palpitations     Objective:   Physical Exam  Nursing note and vitals reviewed. Constitutional: He appears well-developed and well-nourished.  Cardiovascular: Normal rate, regular rhythm and normal heart sounds.   Pulmonary/Chest: Effort normal and breath sounds normal.  Psychiatric: He has a normal mood and affect.       Assessment:     Essential hypertension, benign - Plan: carvedilol (COREG) 3.125 MG tablet, furosemide (LASIX) 20 MG tablet  Atrial fibrillation - Plan: amiodarone (PACERONE) 200 MG tablet  Erectile dysfunction - Plan: Tadalafil (CIALIS) 2.5 MG TABS       Plan:     HTN and afbib - refills provided. He asks if he can stop one or more of his meds. Given his cardiac history, I have advised him to discuss this issue with his cardiologist and NOT stop any meds without cardiology's agreement.   ED - will go ahead and try the cialis. Discussed BP lowering potential and side effects to watch for. If he feels lightheaded or starts dropping his BP he should d/c the cialis. He does not currently take nitrates, nor should he take any during the time he is on cialis. Pt verbalizes understanding.   Will see him back in 2 mos for f/u on chronic med probs, earlier if needed. He should ocnt to f/u with specialists in the meanwhile.

## 2012-08-11 ENCOUNTER — Telehealth: Payer: Self-pay

## 2012-08-11 NOTE — Telephone Encounter (Signed)
1 - afib was the reason he was started on one of his meds. Therefore it is the diagnosis used to code refilling the medication. As we discussed yesterday, he should discuss with cardiology regarding his heart meds.   2 - chantix - Im thrilled he wants to try it for smoking cessation. The three concerns that have to be addressed before prescribing chantix are renal function, psych history, and cardiac health. I will place an order for his lab test to assess renal function at his convenience. Has he ever in the past had any psychiatric illness (bipolar, depression, etc)? Regarding his cardiac health, I will need clearance from his cardiologist. I will accept a phone call from his office or letter. Once these three items have been addressed, if no significant contraindications, we can start chantix.

## 2012-08-11 NOTE — Telephone Encounter (Signed)
Pt says that on pt's discharge papers atrial fibrillation is listed as one of the diagnosis.  Pt says he has not had this for a while and does not understand why this would be listed for yesterday's visit.  Pt would like to speak to Dr. Lucretia Roers about this issue.    Pt asking if he could have script for starter kit for Chantix ([facility] on [address]).  Dr. Lucretia Roers asked if he wanted help with smoking cessation but pt decline, upon thinking about it he thinks he could benefit from this script.

## 2012-08-12 ENCOUNTER — Telehealth: Payer: Self-pay | Admitting: *Deleted

## 2012-08-12 ENCOUNTER — Encounter (INDEPENDENT_AMBULATORY_CARE_PROVIDER_SITE_OTHER): Payer: Self-pay

## 2012-08-12 NOTE — Telephone Encounter (Signed)
08/12/12 Patient walked into clinic today requesting prescription for Chantix patient  Made aware of note written  by Dr. Lucretia Roers today patient stated that he will follow up With cardiologist Dr. Jens Som and will have him to give prescriptions. P.Yahir Tavano,RN BSN [facility]

## 2012-08-13 ENCOUNTER — Telehealth (INDEPENDENT_AMBULATORY_CARE_PROVIDER_SITE_OTHER): Payer: Self-pay | Admitting: *Deleted

## 2012-08-13 NOTE — Telephone Encounter (Signed)
Patient called in regards to a prescription that he said he had spoke to [facility] about getting Dr. Daphine Deutscher to prescribe for him.  This RN spoke to [employer] who stated the prescription was for Chantix however Dr. Daphine Deutscher doesn't prescribe medications such as this so patient would need to follow back up with his PMD.  Patient stated that his PMD needs a letter state that his colon/rectal issues are resolved prior to them being ok with prescribing the Chantix.  Patient requested this letter be sent to his PMD Dr. Lucretia Roers with [facility].  Patient asked for a call once this is completed.

## 2012-08-17 ENCOUNTER — Telehealth: Payer: Self-pay | Admitting: Internal Medicine

## 2012-08-17 NOTE — Telephone Encounter (Signed)
ROI Faxed to [facility] at [phone] 08/17/12/KM

## 2012-08-17 NOTE — Telephone Encounter (Signed)
I have not seen any records from that facility. Could we call and find out if they have been sent  For cardiology clearance, I am happy to take a written note or a phone call - what ever is easiest on their end.

## 2012-08-17 NOTE — Telephone Encounter (Signed)
Pt walked in today and wanted to know if we had received or had access to his records for [facility] in [place].  Pt signed medical release for [facility] and says cardiology is through [facility], so should be in Epic.    As far as clearance from cardiologist, how can patient obtain that?  Does he have cadiologist send over written consent/referral? Pt is confused as to what else he needs for Chantix script.

## 2012-08-18 ENCOUNTER — Telehealth: Payer: Self-pay | Admitting: Internal Medicine

## 2012-08-18 ENCOUNTER — Telehealth: Payer: Self-pay | Admitting: Family Medicine

## 2012-08-18 NOTE — Telephone Encounter (Signed)
Records Rec From [facility] Gave to [facility] 08/18/12/KM

## 2012-08-19 ENCOUNTER — Telehealth: Payer: Self-pay | Admitting: *Deleted

## 2012-08-19 NOTE — Telephone Encounter (Signed)
08/19/12 Attempt to reach patient not available. P.Dereke Neumann<RN

## 2012-09-01 ENCOUNTER — Ambulatory Visit: Payer: Medicare Other | Attending: Family Medicine | Admitting: Internal Medicine

## 2012-09-01 VITALS — BP 115/78 | HR 72 | Temp 98.2°F | Ht 76.0 in | Wt 200.2 lb

## 2012-09-01 DIAGNOSIS — J441 Chronic obstructive pulmonary disease with (acute) exacerbation: Secondary | ICD-10-CM

## 2012-09-01 MED ORDER — ZOLPIDEM TARTRATE 10 MG PO TABS: 10.0000 mg | ORAL_TABLET | Freq: Every evening | ORAL | Status: DC | PRN

## 2012-09-01 MED ORDER — LORAZEPAM 2 MG PO TABS: 2.0000 mg | ORAL_TABLET | Freq: Every evening | ORAL | Status: DC | PRN

## 2012-09-01 NOTE — Patient Instructions (Signed)

## 2012-09-01 NOTE — Progress Notes (Signed)
Patient ID: [name], male   DOB: 01-24-1946, 67 y.o.   MRN: [id number]  CC: Followup  HPI: Patient is 67 year old male with history outlined below who presents to clinic for regular followup. He needs refills on his medicines. He denies chest pain or shortness of breath, no recent sicknesses or hospitalizations, no abdominal or urinary concerns. He explains he is not taking Lasix, amiodarone, Corag anymore. Those medicines were discontinued and he wants to make [place] aware. He follows with cardiologist and those changes were approved.  No Known Allergies Past Medical History  Diagnosis Date  . Atrial fibrillation     diagnosed 2010  . Angina   . Hepatitis     hepatitis B-never treated-Titers were low  . Hypertension   . 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 03/21/2011 - 2D Echo: EF 25% to 30%. Diffuse hypokinesis.  Marland Kitchen HEMORRHOIDS 01/17/2009  . DEPRESSION 01/17/2009  . ALCOHOL ABUSE 01/18/2009  . Heart murmur   . Cancer     Colon   Current Outpatient Prescriptions on File Prior to Visit  Medication Sig Dispense Refill  . aspirin 81 MG tablet Take 81 mg by mouth daily.      . Multiple Vitamin (MULTIVITAMIN) capsule Take 1 capsule by mouth daily.      . Tadalafil (CIALIS) 2.5 MG TABS Take 1 tablet (2.5 mg total) by mouth daily.  30 each  0   No current facility-administered medications on file prior to visit.   Family History  Problem Relation Age of Onset  . Heart disease Neg Hx   . Asthma Mother    History   Social History  . Marital Status: Divorced    Spouse Name: N/A    Number of Children: N/A  . Years of Education: N/A   Occupational History  . Not on file.   Social History Main Topics  . Smoking status: Current Every Day Smoker -- 1.00 packs/day for 40 years    Types: Cigarettes  . Smokeless tobacco: Never Used  . Alcohol Use: No     Comment: Last consumption of alcohol-3 months ago.  . Drug Use: No  . Sexually Active: No   Other Topics Concern   . Not on file   Social History Narrative   Works in Human resources officer cars   Was homeless   Has moved homes x 3 in 2 months   States cannot always afford meds-See PI    Review of Systems  Constitutional: Negative for fever, chills, diaphoresis, activity change, appetite change and fatigue.  HENT: Negative for ear pain, nosebleeds, congestion, facial swelling, rhinorrhea, neck pain, neck stiffness and ear discharge.   Eyes: Negative for pain, discharge, redness, itching and visual disturbance.  Respiratory: Negative for cough, choking, chest tightness, shortness of breath, wheezing and stridor.   Cardiovascular: Negative for chest pain, palpitations and leg swelling.  Gastrointestinal: Negative for abdominal distention.  Genitourinary: Negative for dysuria, urgency, frequency, hematuria, flank pain, decreased urine volume, difficulty urinating and dyspareunia.  Musculoskeletal: Negative for back pain, joint swelling, arthralgias and gait problem.  Neurological: Negative for dizziness, tremors, seizures, syncope, facial asymmetry, speech difficulty, weakness, light-headedness, numbness and headaches.  Hematological: Negative for adenopathy. Does not bruise/bleed easily.  Psychiatric/Behavioral: Negative for hallucinations, behavioral problems, confusion, dysphoric mood, decreased concentration and agitation.    Objective:   Filed Vitals:   09/01/12 1005  BP: 115/78  Pulse: 72  Temp: 98.2 F (36.8 C)    Physical Exam  Constitutional: Appears well-developed and well-nourished. No distress.  CVS: RRR, S1/S2 +, no murmurs, no gallops, no carotid bruit.  Pulmonary: Effort and breath sounds normal, no stridor, rhonchi, wheezes, rales.  Abdominal: Soft. BS +,  no distension, tenderness, rebound or guarding.  Musculoskeletal: Normal range of motion. No edema and no tenderness.    Lab Results  Component Value Date   WBC 7.2 12/02/2011   HGB 14.1 12/02/2011   HCT 41.0  12/02/2011   MCV 91.1 12/02/2011   PLT 153 12/02/2011   Lab Results  Component Value Date   CREATININE 0.91 12/02/2011   BUN 25* 12/02/2011   NA 138 12/02/2011   K 4.4 12/02/2011   CL 104 12/02/2011   CO2 23 12/02/2011    Lab Results  Component Value Date   HGBA1C 5.5 03/17/2011   Lipid Panel     Component Value Date/Time   CHOL 129 01/04/2011 0812   TRIG 86 01/04/2011 0812   HDL 41 01/04/2011 0812   CHOLHDL 3.1 01/04/2011 0812   VLDL 17 01/04/2011 0812   LDLCALC 71 01/04/2011 0812       Assessment and plan:   Patient Active Problem List   Diagnosis Date Noted  . Atrial fibrillation - this is apparently now resolved, patient is off amiodarone per cardiologist. He is in normal sinus rhythm this morning, rate controlled.    . ALCOHOL ABUSE - patient denies alcohol use 01/18/2009  . TOBACCO ABUSE - patient still smoking, we have discussed cessation, patient appreciates support and concern  01/18/2009

## 2012-09-09 ENCOUNTER — Encounter (INDEPENDENT_AMBULATORY_CARE_PROVIDER_SITE_OTHER): Payer: Self-pay | Admitting: Surgery

## 2012-09-09 ENCOUNTER — Ambulatory Visit (INDEPENDENT_AMBULATORY_CARE_PROVIDER_SITE_OTHER): Payer: Medicare Other | Admitting: Surgery

## 2012-09-09 VITALS — BP 120/72 | HR 90 | Resp 14 | Ht 78.0 in | Wt 202.4 lb

## 2012-09-09 DIAGNOSIS — K649 Unspecified hemorrhoids: Secondary | ICD-10-CM

## 2012-09-09 DIAGNOSIS — Z8619 Personal history of other infectious and parasitic diseases: Secondary | ICD-10-CM

## 2012-09-09 NOTE — Progress Notes (Signed)
Chief Complaint:  Prolapsing hemorrhoids  History of Present Illness:  [name] is an 67 y.o. male [place]  Corlis Hove was his PA.  He complains of chronic prolapse that come out when these out in public. His bleeding was improved with banding but the prolapse remains an issue of hygiene and discomfort. He wants to have something done about this. I discussed the pain that ensues after hemorrhoidectomy. He would like to have this done so I recommended an examination under anesthesia with possible banding and/or hemorrhoidectomy. We'll schedule as an outpatient.  I reviewed his problem list and it appears he's probably has a history of prior hepatitis but no active hepatitis. His atrial for ablation was successfully treated and he remains in a sinus rhythm. He is not taking any anticoagulants.  Past Medical History  Diagnosis Date  . Atrial fibrillation     diagnosed 2010  . Angina   . Hepatitis     hepatitis B-never treated-Titers were low  . Hypertension   . 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 03/21/2011 - 2D Echo: EF 25% to 30%. Diffuse hypokinesis.  Marland Kitchen HEMORRHOIDS 01/17/2009  . DEPRESSION 01/17/2009  . ALCOHOL ABUSE 01/18/2009  . Heart murmur   . Cancer     Colon    Past Surgical History  Procedure Laterality Date  . Mouth surgery    . Small intestine surgery    . Colon surgery    . Arterial bypass surgry      Current Outpatient Prescriptions  Medication Sig Dispense Refill  . aspirin 81 MG tablet Take 81 mg by mouth daily.      Marland Kitchen LORazepam (ATIVAN) 2 MG tablet Take 1 tablet (2 mg total) by mouth at bedtime as needed (for sleep).  30 tablet  3  . Multiple Vitamin (MULTIVITAMIN) capsule Take 1 capsule by mouth daily.      . Tadalafil (CIALIS) 2.5 MG TABS Take 1 tablet (2.5 mg total) by mouth daily.  30 each  0  . zolpidem (AMBIEN) 10 MG tablet Take 1 tablet (10 mg total) by mouth at bedtime as needed for sleep.  30 tablet  3    No current facility-administered medications for this visit.   Review of patient's allergies indicates no known allergies. Family History  Problem Relation Age of Onset  . Heart disease Neg Hx   . Asthma Mother    Social History:   reports that he has been smoking Cigarettes.  He has a 40 pack-year smoking history. He has never used smokeless tobacco. He reports that he does not drink alcohol or use illicit drugs.   REVIEW OF SYSTEMS - PERTINENT POSITIVES ONLY: noncontributory  Physical Exam:   Blood pressure 120/72, pulse 90, resp. rate 14, height 6\' 6"  (1.981 m), weight 202 lb 6.4 oz (91.808 kg). Body mass index is 23.39 kg/(m^2).  Gen:  WDWN WM NAD  Neurological: Alert and oriented to person, place, and time. Motor and sensory function is grossly intact  Head: Normocephalic and atraumatic.  Eyes: Conjunctivae are normal. Pupils are equal, round, and reactive to light. No scleral icterus.  Neck: Normal range of motion. Neck supple. No tracheal deviation or thyromegaly present.  Cardiovascular:  SR without murmurs or gallops.  No carotid bruits Respiratory: Effort normal.  No respiratory distress. No chest wall tenderness. Breath sounds normal.  No wheezes, rales or rhonchi.  Abdomen:  Incision is healed nicely.  GU:  Prominent external hemorrhoids on the left side. Musculoskeletal: Normal range of motion. Extremities are nontender. No cyanosis, edema or clubbing noted Lymphadenopathy: No cervical, preauricular, postauricular or axillary adenopathy is present Skin: Skin is warm and dry. No rash noted. No diaphoresis. No erythema. No pallor. Pscyh: Normal mood and affect. Behavior is normal. Judgment and thought content normal.   LABORATORY RESULTS: No results found for this or any previous visit (from the past 48 hour(s)).  RADIOLOGY RESULTS: No results found.  Problem List: Patient Active Problem List   Diagnosis Date Noted  . History of hepatitis B 09/09/2012  . Colon  cancer 07/28/2012  . Thrombocytopenia 11/04/2011  . Chest pain 11/04/2011  . Chronic systolic heart failure 11/03/2011  . 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 ALCOHOL ABUSE 01/18/2009  . TOBACCO ABUSE 01/18/2009  . DEPRESSION 01/17/2009  . HEMORRHOIDS 01/17/2009    Assessment & Plan: Hemorrhoidal prolapse that is symptomatic. Plan exam under anesthesia with hemorrhoidectomy and/or banding.    Matt B. Daphine Deutscher, MD, [facility] [phone] beeper [phone]  09/09/2012 11:00 AM

## 2012-09-09 NOTE — Patient Instructions (Signed)
Hemorrhoidectomy Hemorrhoidectomy is surgery to remove hemorrhoids. Hemorrhoids are veins that have become swollen in the rectum. The rectum is the area from the bottom end of the intestines to the opening where bowel movements leave the body. Hemorrhoids can be uncomfortable. They can cause itching, bleeding and pain if a blood clot forms in them (thrombose). If hemorrhoids are small, surgery may not be needed. But if they cover a larger area, surgery is usually suggested.  LET YOUR CAREGIVER KNOW ABOUT:   Any allergies.  All medications you are taking, including:  Herbs, eyedrops, over-the-counter medications and creams.  Blood thinners (anticoagulants), aspirin or other drugs that could affect blood clotting.  Use of steroids (by mouth or as creams).  Previous problems with anesthetics, including local anesthetics.  Possibility of pregnancy, if this applies.  Any history of blood clots.  Any history of bleeding or other blood problems.  Previous surgery.  Smoking history.  Other health problems. RISKS AND COMPLICATIONS All surgery carries some risk. However, hemorrhoid surgery usually goes smoothly. Possible complications could include:  Urinary retention.  Bleeding.  Infection.  A painful incision.  A reaction to the anesthesia (this is not common). BEFORE THE PROCEDURE   Stop using aspirin and non-steroidal anti-inflammatory drugs (NSAIDs) for pain relief. This includes prescription drugs and over-the-counter drugs such as ibuprofen and naproxen. Also stop taking vitamin E. If possible, do this two weeks before your surgery.  If you take blood-thinners, ask your healthcare provider when you should stop taking them.  You will probably have blood and urine tests done several days before your surgery.  Do not eat or drink for about 8 hours before the surgery.  Arrive at least an hour before the surgery, or whenever your surgeon recommends. This will give you time to  check in and fill out any needed paperwork.  Hemorrhoidectomy is often an outpatient procedure. This means you will be able to go home the same day. Sometimes, though, people stay overnight in the hospital after the procedure. Ask your surgeon what to expect. Either way, make arrangements in advance for someone to drive you home. PROCEDURE   The preparation:  You will change into a hospital gown.  You will be given an IV. A needle will be inserted in your arm. Medication can flow directly into your body through this needle.  You might be given an enema to clear your rectum.  Once in the operating room, you will probably lie on your side or be repositioned later to lying on your stomach.  You will be given anesthesia (medication) so you will not feel anything during the surgery. The surgery often is done with local anesthesia (the area near the hemorrhoids will be numb and you will be drowsy but awake). Sometimes, general anesthesia is used (you will be asleep during the procedure).  The procedure:  There are a few different procedures for hemorrhoids. Be sure to ask you surgeon about the procedure, the risks and benefits.  Be sure to ask about what you need to do to take care of the wound, if there is one. AFTER THE PROCEDURE  You will stay in a recovery area until the anesthesia has worn off. Your blood pressure and pulse will be checked every so often.  You may feel a lot of pain in the area of the rectum.  Take all pain medication prescribed by your surgeon. Ask before taking any over-the-counter pain medicines.  Sometimes sitting in a warm bath can help relieve   your pain.  To make sure you have bowel movements without straining:  You will probably need to take stool softeners (usually a pill) for a few days.  You should drink 8 to 10 glasses of water each day.  Your activity will be restricted for awhile. Ask your caregiver for a list of what you should and should not do  while you recover. Document Released: 12/23/2008 Document Revised: 05/20/2011 Document Reviewed: 12/23/2008 Exit[employer] Patient Information 2014 [employer].  

## 2012-09-30 ENCOUNTER — Ambulatory Visit: Payer: Medicare Other | Attending: Family Medicine | Admitting: Internal Medicine

## 2012-09-30 VITALS — BP 128/81 | HR 72 | Temp 98.2°F | Resp 16 | Ht 76.0 in | Wt 204.0 lb

## 2012-09-30 DIAGNOSIS — G4700 Insomnia, unspecified: Secondary | ICD-10-CM | POA: Insufficient documentation

## 2012-09-30 MED ORDER — ALPRAZOLAM 0.25 MG PO TABS: 0.2500 mg | ORAL_TABLET | Freq: Every evening | ORAL | Status: DC | PRN

## 2012-09-30 NOTE — Progress Notes (Signed)
Patient ID: [name], male   DOB: Sep 10, 1945, 67 y.o.   MRN: [id number]    HPI: 67 y/o male here to get a sleeping pill. Ambien which he was previously taking is now 37 dollars and he can no longer afford it. Trazodone taken in the past but gives him a hangover like effect. He also takes Ativan 2 mg at bedtime but states that it does not help him at all.   No Known Allergies Past Medical History  Diagnosis Date  . Atrial fibrillation     diagnosed 2010  . Angina   . Hepatitis     hepatitis B-never treated-Titers were low  . Hypertension   . 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 03/21/2011 - 2D Echo: EF 25% to 30%. Diffuse hypokinesis.  Marland Kitchen HEMORRHOIDS 01/17/2009  . DEPRESSION 01/17/2009  . ALCOHOL ABUSE 01/18/2009  . Heart murmur   . Cancer     Colon   Prior to Admission medications   Medication Sig Start Date End Date Taking? Authorizing Provider  aspirin 81 MG tablet Take 81 mg by mouth daily.   Yes Historical Provider, MD  LORazepam (ATIVAN) 2 MG tablet Take 1 tablet (2 mg total) by mouth at bedtime as needed (for sleep). 09/01/12  Yes Dorothea Ogle, MD  Multiple Vitamin (MULTIVITAMIN) capsule Take 1 capsule by mouth daily.   Yes Historical Provider, MD  Tadalafil (CIALIS) 2.5 MG TABS Take 1 tablet (2.5 mg total) by mouth daily. 08/10/12  Yes Acey Lav, MD  zolpidem (AMBIEN) 10 MG tablet Take 1 tablet (10 mg total) by mouth at bedtime as needed for sleep. 09/01/12 10/01/12 Yes Iskra Aurther Loft, MD   Family History  Problem Relation Age of Onset  . Heart disease Neg Hx   . Asthma Mother    History   Social History  . Marital Status: Divorced    Spouse Name: N/A    Number of Children: N/A  . Years of Education: N/A   Occupational History  . Not on file.   Social History Main Topics  . Smoking status: Current Every Day Smoker -- 1.00 packs/day for 40 years    Types: Cigarettes  . Smokeless tobacco: Never Used  . Alcohol Use: No     Comment: Last  consumption of alcohol-3 months ago.  . Drug Use: No  . Sexually Active: No   Other Topics Concern  . Not on file   Social History Narrative   Works in Human resources officer cars   Was homeless   Has moved homes x 3 in 2 months   States cannot always afford meds-See PI     Objective:   Filed Vitals:   09/30/12 1538  BP: 128/81  Pulse: 72  Temp: 98.2 F (36.8 C)  Resp: 16    Physical Exam ______ Constitutional: Appears well-developed and well-nourished. No distress. ____ CVS: RRR, S1/S2 +, no murmurs, no gallops, no carotid bruit.  Pulmonary: Effort and breath sounds normal, no stridor, rhonchi, wheezes, rales.  Abdominal: Soft. BS +,  no distension, tenderness, rebound or guarding. ________ Neuro: Alert. Normal reflexes, muscle tone coordination. No cranial nerve deficit. Skin: Skin is warm and dry. No rash noted. Not diaphoretic. No erythema. No pallor.  Psychiatric: Normal mood and affect. Behavior, judgment, thought content normal. __  Lab Results  Component Value Date   WBC 7.2 12/02/2011   HGB 14.1 12/02/2011   HCT 41.0 12/02/2011   MCV 91.1 12/02/2011  PLT 153 12/02/2011   Lab Results  Component Value Date   CREATININE 0.91 12/02/2011   BUN 25* 12/02/2011   NA 138 12/02/2011   K 4.4 12/02/2011   CL 104 12/02/2011   CO2 23 12/02/2011    Lab Results  Component Value Date   HGBA1C 5.5 03/17/2011   Lipid Panel     Component Value Date/Time   CHOL 129 01/04/2011 0812   TRIG 86 01/04/2011 0812   HDL 41 01/04/2011 0812   CHOLHDL 3.1 01/04/2011 0812   VLDL 17 01/04/2011 0812   LDLCALC 71 01/04/2011 0812       Assessment and plan:   Patient Active Problem List   Diagnosis Date Noted  . History of hepatitis B 09/09/2012  . Colon cancer 07/28/2012  . Thrombocytopenia 11/04/2011  . Chest pain 11/04/2011  . Chronic systolic heart failure 11/03/2011  . 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 ALCOHOL ABUSE 01/18/2009  . TOBACCO ABUSE  01/18/2009  . DEPRESSION 01/17/2009  . HEMORRHOIDS 01/17/2009    Insomnia: recommended to take Melatonin 5 to 10 mg at bedtime. If it does not work, try Benadryl 25-50 mg at bedtime. If neither of these are effective he should return back here.

## 2012-09-30 NOTE — Progress Notes (Signed)
Patient presents asking for refill of Remus Loffler, but states his Medicare plan will not cover any more; states still having problems sleeping.

## 2012-09-30 NOTE — Patient Instructions (Addendum)
Insomnia:  recommended to take Melatonin 5-10 mg at bedtime.  If it does not work, try Benadryl 25-50 mg at bedtime.  Advised to Stop Ativan if it is ineffective.

## 2012-10-06 NOTE — Progress Notes (Signed)
Need orders in EPIC.  Surgery scheduled for 10/21/12.  Thank You.   

## 2012-10-09 ENCOUNTER — Encounter (HOSPITAL_COMMUNITY): Payer: Self-pay | Admitting: Pharmacy Technician

## 2012-10-09 NOTE — Progress Notes (Signed)
Need orders in EPIC.  Surgery scheduled for 10/21/12.  Preop on 10/15/12 at 1130am.  Thank You.

## 2012-10-14 ENCOUNTER — Telehealth (INDEPENDENT_AMBULATORY_CARE_PROVIDER_SITE_OTHER): Payer: Self-pay | Admitting: Surgery

## 2012-10-14 NOTE — Telephone Encounter (Signed)
Pt cancelled sx (10/21/12) has too much to do at this time. Will call for OV possibly fall 2014

## 2012-10-15 ENCOUNTER — Inpatient Hospital Stay (HOSPITAL_COMMUNITY): Admission: RE | Admit: 2012-10-15 | Payer: Medicare Other | Source: Ambulatory Visit

## 2012-10-21 ENCOUNTER — Ambulatory Visit: Admit: 2012-10-21 | Payer: Medicare Other | Admitting: Surgery

## 2012-10-21 SURGERY — EXAM UNDER ANESTHESIA WITH HEMORRHOIDECTOMY
Anesthesia: General | Site: Anus

## 2012-11-05 ENCOUNTER — Encounter (INDEPENDENT_AMBULATORY_CARE_PROVIDER_SITE_OTHER): Payer: Medicare Other | Admitting: Surgery

## 2012-12-21 ENCOUNTER — Encounter: Payer: Self-pay | Admitting: Internal Medicine

## 2012-12-21 ENCOUNTER — Ambulatory Visit: Payer: Medicare Other | Attending: Internal Medicine | Admitting: Internal Medicine

## 2012-12-21 VITALS — BP 129/83 | HR 76 | Temp 98.0°F | Resp 16 | Ht 76.0 in | Wt 205.0 lb

## 2012-12-21 DIAGNOSIS — K5900 Constipation, unspecified: Secondary | ICD-10-CM | POA: Insufficient documentation

## 2012-12-21 MED ORDER — BISACODYL 10 MG RE SUPP: 10.0000 mg | Freq: Every day | RECTAL | Status: DC | PRN

## 2012-12-21 MED ORDER — POLYETHYLENE GLYCOL 3350 17 GM/SCOOP PO POWD: 17.0000 g | Freq: Four times a day (QID) | ORAL | Status: DC

## 2012-12-21 MED ORDER — DOCUSATE SODIUM 100 MG PO CAPS: 100.0000 mg | ORAL_CAPSULE | Freq: Two times a day (BID) | ORAL | Status: DC

## 2012-12-21 NOTE — Progress Notes (Signed)
Pt is here for a F/U visit. Pt reports having constipation, stomach pains and cramping

## 2012-12-21 NOTE — Progress Notes (Signed)
CC: constipation  HPI: 67 year old male with past medical history of gastric cancer, hepatitis, atrial fibrillation but not on anticoagulation secondary to history of alcohol abuse and noncompliance were presented to clinic for complaints of constipation. Patient reports having abdominal pains as a result of constipation but abdominal pain improves with bowel movement. Patient takes over-the-counter colon cleanser which did not help with constipation. He takes Colace only as needed. When patient does have a bowel movement he reports is normal consistency and no blood in it.  No Known Allergies Past Medical History  Diagnosis Date  . Atrial fibrillation     diagnosed 2010  . Angina   . Hepatitis     hepatitis B-never treated-Titers were low  . Hypertension   . 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 03/21/2011 - 2D Echo: EF 25% to 30%. Diffuse hypokinesis.  Marland Kitchen HEMORRHOIDS 01/17/2009  . DEPRESSION 01/17/2009  . ALCOHOL ABUSE 01/18/2009  . Heart murmur   . Cancer     Colon  . Insomnia    Current Outpatient Prescriptions on File Prior to Visit  Medication Sig Dispense Refill  . aspirin 81 MG tablet Take 81 mg by mouth every morning.       . diphenhydrAMINE (BENADRYL) 25 MG tablet Take 50 mg by mouth at bedtime.      . docusate sodium (COLACE) 100 MG capsule Take 100 mg by mouth daily.      Marland Kitchen LORazepam (ATIVAN) 2 MG tablet Take 2 mg by mouth at bedtime.      . Multiple Vitamin (MULTIVITAMIN) capsule Take 1 capsule by mouth daily.      Marland Kitchen zinc gluconate 50 MG tablet Take 50 mg by mouth daily.       No current facility-administered medications on file prior to visit.   Family History  Problem Relation Age of Onset  . Heart disease Neg Hx   . Asthma Mother    History   Social History  . Marital Status: Divorced    Spouse Name: N/A    Number of Children: N/A  . Years of Education: N/A   Occupational History  . Not on file.   Social History Main Topics  . Smoking  status: Current Every Day Smoker -- 1.00 packs/day for 40 years    Types: Cigarettes  . Smokeless tobacco: Never Used  . Alcohol Use: No     Comment: Last consumption of alcohol-3 months ago.  . Drug Use: No  . Sexual Activity: No   Other Topics Concern  . Not on file   Social History Narrative   Works in Human resources officer cars   Was homeless   Has moved homes x 3 in 2 months   States cannot always afford meds-See PI    Review of Systems  Constitutional: Negative for fever, chills, diaphoresis, activity change, appetite change and fatigue.  HENT: Negative for ear pain, nosebleeds, congestion, facial swelling, rhinorrhea, neck pain, neck stiffness and ear discharge.   Eyes: Negative for pain, discharge, redness, itching and visual disturbance.  Respiratory: Negative for cough, choking, chest tightness, shortness of breath, wheezing and stridor.   Cardiovascular: Negative for chest pain, palpitations and leg swelling.  Gastrointestinal: Negative for abdominal distention.  Genitourinary: Negative for dysuria, urgency, frequency, hematuria, flank pain, decreased urine volume, difficulty urinating and dyspareunia.  Musculoskeletal: Negative for back pain, joint swelling, arthralgias and gait problem.  Neurological: Negative for dizziness, tremors, seizures, syncope, facial asymmetry, speech difficulty, weakness, light-headedness, numbness and  headaches.  Hematological: Negative for adenopathy. Does not bruise/bleed easily.  Psychiatric/Behavioral: Negative for hallucinations, behavioral problems, confusion, dysphoric mood, decreased concentration and agitation.    Objective:  There were no vitals filed for this visit.  Physical Exam  Constitutional: Appears well-developed and well-nourished. No distress.  HENT: Normocephalic. External right and left ear normal. Oropharynx is clear and moist.  Eyes: Conjunctivae and EOM are normal. PERRLA, no scleral icterus.  Neck: Normal  ROM. Neck supple. No JVD. No tracheal deviation. No thyromegaly.  CVS: RRR, S1/S2 +, no murmurs, no gallops, no carotid bruit.  Pulmonary: Effort and breath sounds normal, no stridor, rhonchi, wheezes, rales.  Abdominal: Soft. BS +,  no distension, tenderness, rebound or guarding.  Musculoskeletal: Normal range of motion. No edema and no tenderness.  Lymphadenopathy: No lymphadenopathy noted, cervical, inguinal. Neuro: Alert. Normal reflexes, muscle tone coordination. No cranial nerve deficit. Skin: Skin is warm and dry. No rash noted. Not diaphoretic. No erythema. No pallor.  Psychiatric: Normal mood and affect. Behavior, judgment, thought content normal.   Lab Results  Component Value Date   WBC 7.2 12/02/2011   HGB 14.1 12/02/2011   HCT 41.0 12/02/2011   MCV 91.1 12/02/2011   PLT 153 12/02/2011   Lab Results  Component Value Date   CREATININE 0.91 12/02/2011   BUN 25* 12/02/2011   NA 138 12/02/2011   K 4.4 12/02/2011   CL 104 12/02/2011   CO2 23 12/02/2011    Lab Results  Component Value Date   HGBA1C 5.5 03/17/2011   Lipid Panel     Component Value Date/Time   CHOL 129 01/04/2011 0812   TRIG 86 01/04/2011 0812   HDL 41 01/04/2011 0812   CHOLHDL 3.1 01/04/2011 0812   VLDL 17 01/04/2011 0812   LDLCALC 71 01/04/2011 0812       Assessment and plan:   Patient Active Problem List   Diagnosis Date Noted  . Constipation - Colace twice daily 100 mg, MiraLAX 4 times daily and bisacodyl daily as needed. Patient will return in 2 weeks to see if symptoms improve  - Referral to gastroenterology provided  09/30/2012  . Preventive health - declined flu and PNA shot today 09/09/2012

## 2012-12-21 NOTE — Patient Instructions (Signed)

## 2012-12-24 ENCOUNTER — Telehealth: Payer: Self-pay | Admitting: Emergency Medicine

## 2012-12-24 ENCOUNTER — Emergency Department (HOSPITAL_COMMUNITY): Payer: Medicare Other

## 2012-12-24 ENCOUNTER — Emergency Department (HOSPITAL_COMMUNITY)
Admission: EM | Admit: 2012-12-24 | Discharge: 2012-12-24 | Disposition: A | Discharge status: Home / Self Care | Payer: Medicare Other | Attending: Emergency Medicine | Admitting: Emergency Medicine

## 2012-12-24 ENCOUNTER — Telehealth (INDEPENDENT_AMBULATORY_CARE_PROVIDER_SITE_OTHER): Payer: Self-pay

## 2012-12-24 ENCOUNTER — Encounter (HOSPITAL_COMMUNITY): Payer: Self-pay | Admitting: Emergency Medicine

## 2012-12-24 DIAGNOSIS — Z85038 Personal history of other malignant neoplasm of large intestine: Secondary | ICD-10-CM | POA: Insufficient documentation

## 2012-12-24 DIAGNOSIS — R011 Cardiac murmur, unspecified: Secondary | ICD-10-CM | POA: Insufficient documentation

## 2012-12-24 DIAGNOSIS — Z79899 Other long term (current) drug therapy: Secondary | ICD-10-CM | POA: Insufficient documentation

## 2012-12-24 DIAGNOSIS — K769 Liver disease, unspecified: Secondary | ICD-10-CM

## 2012-12-24 DIAGNOSIS — K922 Gastrointestinal hemorrhage, unspecified: Secondary | ICD-10-CM

## 2012-12-24 DIAGNOSIS — K648 Other hemorrhoids: Secondary | ICD-10-CM

## 2012-12-24 DIAGNOSIS — K7689 Other specified diseases of liver: Secondary | ICD-10-CM | POA: Insufficient documentation

## 2012-12-24 DIAGNOSIS — I10 Essential (primary) hypertension: Secondary | ICD-10-CM | POA: Insufficient documentation

## 2012-12-24 DIAGNOSIS — R109 Unspecified abdominal pain: Secondary | ICD-10-CM

## 2012-12-24 DIAGNOSIS — K5900 Constipation, unspecified: Secondary | ICD-10-CM | POA: Insufficient documentation

## 2012-12-24 DIAGNOSIS — F3289 Other specified depressive episodes: Secondary | ICD-10-CM | POA: Insufficient documentation

## 2012-12-24 DIAGNOSIS — F329 Major depressive disorder, single episode, unspecified: Secondary | ICD-10-CM | POA: Insufficient documentation

## 2012-12-24 DIAGNOSIS — Z8619 Personal history of other infectious and parasitic diseases: Secondary | ICD-10-CM | POA: Insufficient documentation

## 2012-12-24 DIAGNOSIS — F17200 Nicotine dependence, unspecified, uncomplicated: Secondary | ICD-10-CM | POA: Insufficient documentation

## 2012-12-24 LAB — COMPREHENSIVE METABOLIC PANEL
ALT: 27 U/L (ref 0–53)
AST: 37 U/L (ref 0–37)
Albumin: 4.3 g/dL (ref 3.5–5.2)
Alkaline Phosphatase: 104 U/L (ref 39–117)
Calcium: 9.2 mg/dL (ref 8.4–10.5)
GFR calc Af Amer: >90 mL/min (ref >90)
Glucose, Bld: 79 mg/dL (ref 70–99)
Potassium: 4.2 mEq/L (ref 3.5–5.1)
Sodium: 133 mEq/L — ABNORMAL LOW (ref 135–145)
Total Protein: 7.8 g/dL (ref 6.0–8.3)

## 2012-12-24 LAB — URINALYSIS, ROUTINE W REFLEX MICROSCOPIC
Bilirubin Urine: NEGATIVE
Glucose, UA: NEGATIVE mg/dL
Ketones, ur: NEGATIVE mg/dL
Leukocytes, UA: NEGATIVE
Nitrite: NEGATIVE
Protein, ur: NEGATIVE mg/dL
Specific Gravity, Urine: 1.01 (ref 1.005–1.030)
Urobilinogen, UA: 0.2 mg/dL (ref 0.0–1.0)
pH: 5.5 (ref 5.0–8.0)

## 2012-12-24 LAB — COMPREHENSIVE METABOLIC PANEL WITH GFR
BUN: 14 mg/dL (ref 6–23)
CO2: 20 meq/L (ref 19–32)
Chloride: 97 meq/L (ref 96–112)
Creatinine, Ser: 0.82 mg/dL (ref 0.50–1.35)
GFR calc non Af Amer: >90 mL/min (ref >90)
Total Bilirubin: 0.6 mg/dL (ref 0.3–1.2)

## 2012-12-24 LAB — URINE MICROSCOPIC-ADD ON

## 2012-12-24 LAB — PROTIME-INR
INR: 1.08 (ref 0.00–1.49)
Prothrombin Time: 13.8 seconds (ref 11.6–15.2)

## 2012-12-24 LAB — LIPASE, BLOOD: Lipase: 25 U/L (ref 11–59)

## 2012-12-24 LAB — CBC
HCT: 43.8 % (ref 39.0–52.0)
Hemoglobin: 16.1 g/dL (ref 13.0–17.0)
MCH: 31.6 pg (ref 26.0–34.0)
MCHC: 36.8 g/dL — ABNORMAL HIGH (ref 30.0–36.0)
MCV: 86.1 fL (ref 78.0–100.0)
Platelets: 149 10*3/uL — ABNORMAL LOW (ref 150–400)
RBC: 5.09 MIL/uL (ref 4.22–5.81)
RDW: 15.8 % — ABNORMAL HIGH (ref 11.5–15.5)
WBC: 8 K/uL (ref 4.0–10.5)

## 2012-12-24 LAB — OCCULT BLOOD, POC DEVICE: Fecal Occult Bld: POSITIVE — AB

## 2012-12-24 LAB — TYPE AND SCREEN
ABO/RH(D): O POS
Antibody Screen: NEGATIVE

## 2012-12-24 LAB — APTT: aPTT: 34 seconds (ref 24–37)

## 2012-12-24 LAB — ABO/RH: ABO/RH(D): O POS

## 2012-12-24 MED ORDER — SODIUM CHLORIDE 0.9 % IV SOLN
Freq: Once | INTRAVENOUS | Status: AC
  Administered 2012-12-24: 12:00:00 via INTRAVENOUS

## 2012-12-24 MED ORDER — IOHEXOL 300 MG/ML  SOLN
25.0000 mL | INTRAMUSCULAR | Status: AC
  Administered 2012-12-24: 25 mL via ORAL

## 2012-12-24 MED ORDER — FENTANYL CITRATE 0.05 MG/ML IJ SOLN
50.0000 ug | Freq: Once | INTRAMUSCULAR | Status: AC
  Administered 2012-12-24: 50 ug via INTRAVENOUS
  Filled 2012-12-24: qty 2

## 2012-12-24 MED ORDER — IOHEXOL 300 MG/ML  SOLN
100.0000 mL | Freq: Once | INTRAMUSCULAR | Status: AC | PRN
  Administered 2012-12-24: 100 mL via INTRAVENOUS

## 2012-12-24 NOTE — Consult Note (Signed)
[name] [birth year]  [id number].   Primary Care NW:[facility] care Requesting MD: Dr. Oletta Lamas Chief Complaint/Reason for Consult: rectal bleeding HPI: This is a 67 yo male who was diagnosed with colon cancer in May of this year and have a sigmoid colectomy at [facility].  He returned to [facility] and had some wound issues and followed up with Dr. Daphine Deutscher.  He was then noted to have internal hemorrhoids and was set up for hemorrhoidectomy, but canceled as his symptoms resolved.  The last 2 weeks he has had constipation and have been straining and taking stool softeners to no avail.  He then began using Miralax and suppositories.  Today, he began having BRBPR with each BM.  He saw his urgent care, who then sent him to the ED for further evaluation.  Here, he had a CT scan which revealed some cecal thickening, likely related to under distention and a small liver lesion, possibly cyst vs met given his history.  A repeat CT scan is recommended in 6 months to follow this up.  We have been asked to see him for further evaluation of his rectal bleeding.  ROS: please see HPI, otherwise all other systems are negative  Family History  Problem Relation Age of Onset  . Heart disease Neg Hx   . Asthma Mother     Past Medical History  Diagnosis Date  . Atrial fibrillation     diagnosed 2010  . Angina   . Hepatitis     hepatitis B-never treated-Titers were low  . Hypertension   . 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 03/21/2011 - 2D Echo: EF 25% to 30%. Diffuse hypokinesis.  Marland Kitchen HEMORRHOIDS 01/17/2009  . DEPRESSION 01/17/2009  . ALCOHOL ABUSE 01/18/2009  . Heart murmur   . Insomnia   . Cancer     Colon    Past Surgical History  Procedure Laterality Date  . Mouth surgery    . Small intestine surgery    . Colon surgery    . Arterial bypass surgry      Social History:  reports that he has been smoking Cigarettes.  He has a 40 pack-year smoking history. He has never used  smokeless tobacco. He reports that he does not drink alcohol or use illicit drugs.  Allergies: No Known Allergies   (Not in a hospital admission)  Blood pressure 113/97, pulse 68, temperature 97.5 F (36.4 C), temperature source Oral, resp. rate 16, height 6\' 4"  (1.93 m), weight 204 lb 8 oz (92.761 kg), SpO2 99.00%. Physical Exam: General: WD, WN WM who is laying in bed in NAD Abd: soft, NT, ND, +BS, no masses, hernias, organomegaly Rectal: external exam is normal.  DRE reveals 2 internal hemorrhoids palpable at 3 and 7 oclock.  There is blood stain around his anus, but not blood on the glove after his DRE. Psych: A&O x3, with an appropriate affect   Results for orders placed during the hospital encounter of 12/24/12 (from the past 48 hour(s))  CBC     Status: Abnormal   Collection Time    12/24/12 10:55 AM      Result Value Range   WBC 8.0  4.0 - 10.5 K/uL   RBC 5.09  4.22 - 5.81 MIL/uL   Hemoglobin 16.1  13.0 - 17.0 g/dL   HCT 30.8  65.7 - 84.6 %   MCV 86.1  78.0 - 100.0 fL   MCH 31.6  26.0 - 34.0 pg  MCHC 36.8 (*) 30.0 - 36.0 g/dL   RDW 40.9 (*) 81.1 - 91.4 %   Platelets 149 (*) 150 - 400 K/uL  COMPREHENSIVE METABOLIC PANEL     Status: Abnormal   Collection Time    12/24/12 10:55 AM      Result Value Range   Sodium 133 (*) 135 - 145 mEq/L   Potassium 4.2  3.5 - 5.1 mEq/L   Chloride 97  96 - 112 mEq/L   CO2 20  19 - 32 mEq/L   Glucose, Bld 79  70 - 99 mg/dL   BUN 14  6 - 23 mg/dL   Creatinine, Ser 7.82  0.50 - 1.35 mg/dL   Calcium 9.2  8.4 - 95.6 mg/dL   Total Protein 7.8  6.0 - 8.3 g/dL   Albumin 4.3  3.5 - 5.2 g/dL   AST 37  0 - 37 U/L   ALT 27  0 - 53 U/L   Alkaline Phosphatase 104  39 - 117 U/L   Total Bilirubin 0.6  0.3 - 1.2 mg/dL   GFR calc non Af Amer >90  >90 mL/min   GFR calc Af Amer >90  >90 mL/min   Comment: (NOTE)     The eGFR has been calculated using the CKD EPI equation.     This calculation has not been validated in all clinical situations.      eGFR's persistently <90 mL/min signify possible Chronic Kidney     Disease.  LIPASE, BLOOD     Status: None   Collection Time    12/24/12 10:55 AM      Result Value Range   Lipase 25  11 - 59 U/L  APTT     Status: None   Collection Time    12/24/12 10:55 AM      Result Value Range   aPTT 34  24 - 37 seconds  PROTIME-INR     Status: None   Collection Time    12/24/12 10:55 AM      Result Value Range   Prothrombin Time 13.8  11.6 - 15.2 seconds   INR 1.08  0.00 - 1.49  OCCULT BLOOD, POC DEVICE     Status: Abnormal   Collection Time    12/24/12 11:03 AM      Result Value Range   Fecal Occult Bld POSITIVE (*) NEGATIVE  TYPE AND SCREEN     Status: None   Collection Time    12/24/12 12:15 PM      Result Value Range   ABO/RH(D) O POS     Antibody Screen NEG     Sample Expiration 12/27/2012    ABO/RH     Status: None   Collection Time    12/24/12 12:15 PM      Result Value Range   ABO/RH(D) O POS    URINALYSIS, ROUTINE W REFLEX MICROSCOPIC     Status: Abnormal   Collection Time    12/24/12  1:09 PM      Result Value Range   Color, Urine YELLOW  YELLOW   APPearance CLEAR  CLEAR   Specific Gravity, Urine 1.010  1.005 - 1.030   pH 5.5  5.0 - 8.0   Glucose, UA NEGATIVE  NEGATIVE mg/dL   Hgb urine dipstick TRACE (*) NEGATIVE   Bilirubin Urine NEGATIVE  NEGATIVE   Ketones, ur NEGATIVE  NEGATIVE mg/dL   Protein, ur NEGATIVE  NEGATIVE mg/dL   Urobilinogen, UA 0.2  0.0 - 1.0 mg/dL  Nitrite NEGATIVE  NEGATIVE   Leukocytes, UA NEGATIVE  NEGATIVE  URINE MICROSCOPIC-ADD ON     Status: None   Collection Time    12/24/12  1:09 PM      Result Value Range   WBC, UA 0-2  <3 WBC/hpf   RBC / HPF 0-2  <3 RBC/hpf   Ct Abdomen Pelvis W Contrast  12/24/2012   CLINICAL DATA:  Right lower quadrant pain and rectal bleeding. Constipation. History of colon cancer with prior surgery. 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 Hepatitis. Hypertension.  EXAM: CT ABDOMEN AND PELVIS WITH  CONTRAST  TECHNIQUE: Multidetector CT imaging of the abdomen and pelvis was performed using the standard protocol following bolus administration of intravenous contrast.  CONTRAST:  OMNIPAQUE IOHEXOL 300 MG/ML  SOLN  COMPARISON:  None.  FINDINGS: Colonic surgery with anastomosis rectosigmoid region. Within the pelvis are prominent vessels and small nodes but without adenopathy.  Ascending colon with circumferential thickening. It is possible this is related to under distension although underlying mass or colitis not excluded. No extra luminal bowel inflammatory process, free fluid or free air. There may be a tiny appendicolith but no inflammation surrounding the appendix.  Tiny low-density lesions within the right lobe liver may be a cysts although given the patient's history of colon cancer and lack of prior exams, followup CT in 6 months recommended to confirm stability.  No worrisome splenic, pancreatic, adrenal or renal lesion. Small right renal cyst noted.  Coronary artery calcifications. Atherosclerotic type changes of the aorta, iliac arteries and femoral arteries. No evidence of abdominal aortic aneurysm. Narrowing of the celiac artery origin.  No calcified gallstones.  Small retroperitoneal nodes without adenopathy.  Motion degraded imaging of the lung bases with subsegmental atelectatic changes.  Underdistended non contrast filled imaging of the urinary bladder unremarkable.  Mild prominence of the seminal vesicles felt to be incidental finding.  Degenerative changes lumbar spine without bony destructive lesion.  IMPRESSION: Colonic surgery with anastomosis rectosigmoid region.  Ascending colon with circumferential thickening. It is possible this is related to under distension although underlying mass or colitis not excluded. No extra luminal bowel inflammatory process, free fluid or free air. There may be a tiny appendicolith but no inflammation surrounding the appendix.  Tiny low-density lesions  within the right lobe liver may be a cysts although given the patient's history of colon cancer and lack of prior exams, followup CT in 6 months recommended to confirm stability.  Atherosclerotic type changes as noted above.   Electronically Signed   By: Bridgett Larsson M.D.   On: 12/24/2012 15:01       Assessment/Plan 1. BRBPR secondary to internal hemorrhoids 2. H/o sigmoid colon cancer 3. Small liver lesion, unknown etiology Patient Active Problem List   Diagnosis Date Noted  . Insomnia 09/30/2012  . History of hepatitis B 09/09/2012  . Colon cancer 07/28/2012  . Thrombocytopenia 11/04/2011  . Chest pain 11/04/2011  . Chronic systolic heart failure 11/03/2011  . 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 ALCOHOL ABUSE 01/18/2009  . TOBACCO ABUSE 01/18/2009  . DEPRESSION 01/17/2009  . HEMORRHOIDS 01/17/2009   Plan: 1. The patient will need to follow up with Dr. Daphine Deutscher in the office for further evaluation of his internal hemorrhoids, whether that be banding or hemorrhoidectomy.   2. He will also need further follow up with repeat CT scan of abdomen and pelvis in 6 months to further evaluate the liver.  Unsure whether patient ever had oncology  follow up.  Will defer this to outpatient workup with Dr. Daphine Deutscher. 3. Recommend continued stool softeners to help avoid constipation.  Yordy Matton E 12/24/2012, 4:30 PM Pager: [phone]

## 2012-12-24 NOTE — Telephone Encounter (Signed)
Pt called stating he is having constipation symptoms. Pt states he was seen this week by his PCP for constipation and on treatment for it. Pt states hemorrhoids are not his concern at this time. Pt advised to call his PCP and have them do a more formal work up or even referral to GI MD since pt has had a dx in the past for colon cancer. Pt state he understands and will call his PCP this morning. Pt advised if there is a surgical need we will be happy to see the pt here.

## 2012-12-24 NOTE — ED Provider Notes (Addendum)
CSN: [id number]     Arrival date & time 12/24/12  4696 History   First MD Initiated Contact with Patient 12/24/12 1055     Chief Complaint  Patient presents with  . Rectal Bleeding  . Abdominal Pain   (Consider location/radiation/quality/duration/timing/severity/associated sxs/prior Treatment) HPI Comments: Pt has had constipation for about 1-2 weeks, taking stool softeners and "colon cleanse" without much relief.  He knows he has 2 small hemorrhoids that he was going to have surgery, but they improved and cancelled.  He went to PMD at [facility] and was given colace,   Patient is a 67 y.o. male presenting with hematochezia and abdominal pain. The history is provided by the patient and medical records.  Rectal Bleeding Quality:  Bright red Amount:  Moderate Timing:  Sporadic Progression:  Unchanged Chronicity:  New Context: constipation, defecation and hemorrhoids   Similar prior episodes: no   Relieved by:  Nothing Worsened by:  Nothing tried Associated symptoms: abdominal pain   Associated symptoms: no dizziness, no fever, no light-headedness and no vomiting   Risk factors: hx of colorectal cancer and hx of colorectal surgery   Risk factors: no anticoagulant use and no NSAID use   Abdominal Pain Associated symptoms: constipation and hematochezia   Associated symptoms: no chills, no fever, no nausea, no shortness of breath and no vomiting     Past Medical History  Diagnosis Date  . Atrial fibrillation     diagnosed 2010  . Angina   . Hepatitis     hepatitis B-never treated-Titers were low  . Hypertension   . 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 03/21/2011 - 2D Echo: EF 25% to 30%. Diffuse hypokinesis.  Marland Kitchen HEMORRHOIDS 01/17/2009  . DEPRESSION 01/17/2009  . ALCOHOL ABUSE 01/18/2009  . Heart murmur   . Insomnia   . Cancer     Colon   Past Surgical History  Procedure Laterality Date  . Mouth surgery    . Small intestine surgery    . Colon surgery     . Arterial bypass surgry     Family History  Problem Relation Age of Onset  . Heart disease Neg Hx   . Asthma Mother    History  Substance Use Topics  . Smoking status: Current Every Day Smoker -- 1.00 packs/day for 40 years    Types: Cigarettes  . Smokeless tobacco: Never Used  . Alcohol Use: No     Comment: Last consumption of alcohol-3 months ago.    Review of Systems  Constitutional: Negative for fever and chills.  Respiratory: Negative for shortness of breath.   Gastrointestinal: Positive for abdominal pain, constipation, blood in stool, hematochezia and anal bleeding. Negative for nausea and vomiting.  Musculoskeletal: Negative for back pain.  Neurological: Negative for dizziness, weakness and light-headedness.  All other systems reviewed and are negative.    Allergies  Review of patient's allergies indicates no known allergies.  Home Medications   Current Outpatient Rx  Name  Route  Sig  Dispense  Refill  . aspirin 81 MG tablet   Oral   Take 81 mg by mouth every morning.          . bisacodyl (DULCOLAX) 10 MG suppository   Rectal   Place 1 suppository (10 mg total) rectally daily as needed for constipation.   30 suppository   0   . diphenhydrAMINE (BENADRYL) 25 MG tablet   Oral   Take 50 mg by mouth at bedtime.         Marland Kitchen  docusate sodium (COLACE) 100 MG capsule   Oral   Take 1 capsule (100 mg total) by mouth 2 (two) times daily.   60 capsule   3   . LORazepam (ATIVAN) 2 MG tablet   Oral   Take 2 mg by mouth at bedtime.         . Multiple Vitamin (MULTIVITAMIN) capsule   Oral   Take 1 capsule by mouth daily.         . polyethylene glycol powder (GLYCOLAX/MIRALAX) powder   Oral   Take 17 g by mouth 4 (four) times daily.   3350 g   3    BP 113/97  Pulse 68  Temp(Src) 97.5 F (36.4 C) (Oral)  Resp 16  Ht 6\' 4"  (1.93 m)  Wt 204 lb 8 oz (92.761 kg)  BMI 24.9 kg/m2  SpO2 99% Physical Exam  Nursing note and vitals  reviewed. Constitutional: He is oriented to person, place, and time. He appears well-developed and well-nourished. No distress.  HENT:  Head: Normocephalic and atraumatic.  Mouth/Throat: Oropharynx is clear and moist.  Eyes: Conjunctivae and EOM are normal. No scleral icterus.  Neck: Normal range of motion. Neck supple.  Cardiovascular: Normal rate, regular rhythm and intact distal pulses.   Pulmonary/Chest: Effort normal. No respiratory distress. He has no wheezes.  Abdominal: Soft. He exhibits no distension. There is no tenderness. There is no rebound.  Genitourinary: Rectal exam shows external hemorrhoid and tenderness. Rectal exam shows anal tone normal. Guaiac positive stool.  Musculoskeletal: He exhibits no edema and no tenderness.  Neurological: He is alert and oriented to person, place, and time. Coordination normal.  Skin: Skin is warm and dry. No rash noted. He is not diaphoretic.  Psychiatric: He has a normal mood and affect.    ED Course  Procedures (including critical care time) Labs Review Labs Reviewed  CBC - Abnormal; Notable for the following:    MCHC 36.8 (*)    RDW 15.8 (*)    Platelets 149 (*)    All other components within normal limits  COMPREHENSIVE METABOLIC PANEL - Abnormal; Notable for the following:    Sodium 133 (*)    All other components within normal limits  URINALYSIS, ROUTINE W REFLEX MICROSCOPIC - Abnormal; Notable for the following:    Hgb urine dipstick TRACE (*)    All other components within normal limits  OCCULT BLOOD, POC DEVICE - Abnormal; Notable for the following:    Fecal Occult Bld POSITIVE (*)    All other components within normal limits  LIPASE, BLOOD  APTT  PROTIME-INR  URINE MICROSCOPIC-ADD ON  TYPE AND SCREEN  ABO/RH   Imaging Review Ct Abdomen Pelvis W Contrast  12/24/2012   CLINICAL DATA:  Right lower quadrant pain and rectal bleeding. Constipation. History of colon cancer with prior surgery. Dilated cardiomyopathy  secondary to alcohol. Hepatitis. Hypertension.  EXAM: CT ABDOMEN AND PELVIS WITH CONTRAST  TECHNIQUE: Multidetector CT imaging of the abdomen and pelvis was performed using the standard protocol following bolus administration of intravenous contrast.  CONTRAST:  OMNIPAQUE IOHEXOL 300 MG/ML  SOLN  COMPARISON:  None.  FINDINGS: Colonic surgery with anastomosis rectosigmoid region. Within the pelvis are prominent vessels and small nodes but without adenopathy.  Ascending colon with circumferential thickening. It is possible this is related to under distension although underlying mass or colitis not excluded. No extra luminal bowel inflammatory process, free fluid or free air. There may be a tiny appendicolith but no inflammation  surrounding the appendix.  Tiny low-density lesions within the right lobe liver may be a cysts although given the patient's history of colon cancer and lack of prior exams, followup CT in 6 months recommended to confirm stability.  No worrisome splenic, pancreatic, adrenal or renal lesion. Small right renal cyst noted.  Coronary artery calcifications. Atherosclerotic type changes of the aorta, iliac arteries and femoral arteries. No evidence of abdominal aortic aneurysm. Narrowing of the celiac artery origin.  No calcified gallstones.  Small retroperitoneal nodes without adenopathy.  Motion degraded imaging of the lung bases with subsegmental atelectatic changes.  Underdistended non contrast filled imaging of the urinary bladder unremarkable.  Mild prominence of the seminal vesicles felt to be incidental finding.  Degenerative changes lumbar spine without bony destructive lesion.  IMPRESSION: Colonic surgery with anastomosis rectosigmoid region.  Ascending colon with circumferential thickening. It is possible this is related to under distension although underlying mass or colitis not excluded. No extra luminal bowel inflammatory process, free fluid or free air. There may be a tiny  appendicolith but no inflammation surrounding the appendix.  Tiny low-density lesions within the right lobe liver may be a cysts although given the patient's history of colon cancer and lack of prior exams, followup CT in 6 months recommended to confirm stability.  Atherosclerotic type changes as noted above.   Electronically Signed   By: Bridgett Larsson M.D.   On: 12/24/2012 15:01    EKG Interpretation   None      ra sat is 100% and I interpret to be normal   3:40 PM CT shows liver lesions, non specific in ascending colon, but no inflammation around appendic, no diverticulitis.  Pt had another small BM with blood, will consult unassigned medicine to admit pt for monitoring and further eval for lower GI bleed.        4:03 PM Spoke to [facility] Dr. Bosie Clos to see pt.  She spoke to general surgery Dr. Daphine Deutscher and they will see pt in the ED to see if pt just needs outpt follow up for hemorrhoid bleeding versus admission for undifferentiated lower GI bleed.  Dr. Bosie Clos was going to wait upon word from surgery.    4:29 PM Seen by surgery, ok to go home, pt would prefer to go home.  Pt needs to call general surgery clinic for follow up as soon as possible.   MDM   1. Lower GI bleed   2. Abdominal pain   3. Hepatic lesion   4. Internal hemorrhoids      Pt with abd pain, possibly related to constipation, will need CT to r./o diverticulitis and possibly new mass.  No hemorrhage from hemorrhoids that is obvious on exam.  Hgb is stable, pt is not hypotensive.       Gavin Pound. Oletta Lamas, MD 12/24/12 1541  Gavin Pound. Oletta Lamas, MD 12/24/12 1603  Gavin Pound. Talyssa Gibas, MD 12/24/12 1610

## 2012-12-24 NOTE — Discharge Instructions (Signed)
 Hemorrhoids Hemorrhoids are swollen veins around the rectum or anus. There are two types of hemorrhoids:   Internal hemorrhoids. These occur in the veins just inside the rectum. They may poke through to the outside and become irritated and painful.  External hemorrhoids. These occur in the veins outside the anus and can be felt as a painful swelling or hard lump near the anus. CAUSES  Pregnancy.   Obesity.   Constipation or diarrhea.   Straining to have a bowel movement.   Sitting for long periods on the toilet.  Heavy lifting or other activity that caused you to strain.  Anal intercourse. SYMPTOMS   Pain.   Anal itching or irritation.   Rectal bleeding.   Fecal leakage.   Anal swelling.   One or more lumps around the anus.  DIAGNOSIS  Your caregiver may be able to diagnose hemorrhoids by visual examination. Other examinations or tests that may be performed include:   Examination of the rectal area with a gloved hand (digital rectal exam).   Examination of anal canal using a small tube (scope).   A blood test if you have lost a significant amount of blood.  A test to look inside the colon (sigmoidoscopy or colonoscopy). TREATMENT Most hemorrhoids can be treated at home. However, if symptoms do not seem to be getting better or if you have a lot of rectal bleeding, your caregiver may perform a procedure to help make the hemorrhoids get smaller or remove them completely. Possible treatments include:   Placing a rubber band at the base of the hemorrhoid to cut off the circulation (rubber band ligation).   Injecting a chemical to shrink the hemorrhoid (sclerotherapy).   Using a tool to burn the hemorrhoid (infrared light therapy).   Surgically removing the hemorrhoid (hemorrhoidectomy).   Stapling the hemorrhoid to block blood flow to the tissue (hemorrhoid stapling).  HOME CARE INSTRUCTIONS   Eat foods with fiber, such as whole grains, beans,  nuts, fruits, and vegetables. Ask your doctor about taking products with added fiber in them (fibersupplements).  Increase fluid intake. Drink enough water and fluids to keep your urine clear or pale yellow.   Exercise regularly.   Go to the bathroom when you have the urge to have a bowel movement. Do not wait.   Avoid straining to have bowel movements.   Keep the anal area dry and clean. Use wet toilet paper or moist towelettes after a bowel movement.   Medicated creams and suppositories may be used or applied as directed.   Only take over-the-counter or prescription medicines as directed by your caregiver.   Take warm sitz baths for 15 20 minutes, 3 4 times a day to ease pain and discomfort.   Place ice packs on the hemorrhoids if they are tender and swollen. Using ice packs between sitz baths may be helpful.   Put ice in a plastic bag.   Place a towel between your skin and the bag.   Leave the ice on for 15 20 minutes, 3 4 times a day.   Do not use a donut-shaped pillow or sit on the toilet for long periods. This increases blood pooling and pain.  SEEK MEDICAL CARE IF:  You have increasing pain and swelling that is not controlled by treatment or medicine.  You have uncontrolled bleeding.  You have difficulty or you are unable to have a bowel movement.  You have pain or inflammation outside the area of the hemorrhoids. MAKE SURE YOU:  Understand these instructions.  Will watch your condition.  Will get help right away if you are not doing well or get worse. Document Released: 02/23/2000 Document Revised: 02/12/2012 Document Reviewed: 12/31/2011 [facility] Patient Information 2014 [place], [place].    Abdominal Pain (Nonspecific) Your exam might not show the exact reason you have abdominal pain. Since there are many different causes of abdominal pain, another checkup and more tests may be needed. It is very important to follow up for lasting (persistent)  or worsening symptoms. A possible cause of abdominal pain in any person who still has his or her appendix is acute appendicitis. Appendicitis is often hard to diagnose. Normal blood tests, urine tests, ultrasound, and CT scans do not completely rule out early appendicitis or other causes of abdominal pain. Sometimes, only the changes that happen over time will allow appendicitis and other causes of abdominal pain to be determined. Other potential problems that may require surgery may also take time to become more apparent. Because of this, it is important that you follow all of the instructions below. HOME CARE INSTRUCTIONS   Rest as much as possible.  Do not eat solid food until your pain is gone.  While adults or children have pain: A diet of water, weak decaffeinated tea, broth or bouillon, gelatin, oral rehydration solutions (ORS), frozen ice pops, or ice chips may be helpful.  When pain is gone in adults or children: Start a light diet (dry toast, crackers, applesauce, or white rice). Increase the diet slowly as long as it does not bother you. Eat no dairy products (including cheese and eggs) and no spicy, fatty, fried, or high-fiber foods.  Use no alcohol, caffeine, or cigarettes.  Take your regular medicines unless your caregiver told you not to.  Take any prescribed medicine as directed.  Only take over-the-counter or prescription medicines for pain, discomfort, or fever as directed by your caregiver. Do not give aspirin  to children. If your caregiver has given you a follow-up appointment, it is very important to keep that appointment. Not keeping the appointment could result in a permanent injury and/or lasting (chronic) pain and/or disability. If there is any problem keeping the appointment, you must call to reschedule.  SEEK IMMEDIATE MEDICAL CARE IF:   Your pain is not gone in 24 hours.  Your pain becomes worse, changes location, or feels different.  You or your child has an oral  temperature above 102 F (38.9 C), not controlled by medicine.  Your baby is older than 3 months with a rectal temperature of 102 F (38.9 C) or higher.  Your baby is 32 months old or younger with a rectal temperature of 100.4 F (38 C) or higher.  You have shaking chills.  You keep throwing up (vomiting) or cannot drink liquids.  There is blood in your vomit or you see blood in your bowel movements.  Your bowel movements become dark or black.  You have frequent bowel movements.  Your bowel movements stop (become blocked) or you cannot pass gas.  You have bloody, frequent, or painful urination.  You have yellow discoloration in the skin or whites of the eyes.  Your stomach becomes bloated or bigger.  You have dizziness or fainting.  You have chest or back pain. MAKE SURE YOU:   Understand these instructions.  Will watch your condition.  Will get help right away if you are not doing well or get worse. Document Released: 02/25/2005 Document Revised: 05/20/2011 Document Reviewed: 01/23/2009 [employer] Patient Information 2014  [employer].

## 2012-12-24 NOTE — ED Notes (Signed)
Called CT to notify that pt still needs 2nd cup of contrast.

## 2012-12-24 NOTE — Telephone Encounter (Signed)
Pt here in person today stating he's experiencing sudden bright red rectal bleeding this am.pt called Dr. Daphine Deutscher this am and was told to come here. C/o weakness but denies dizziness. Pt instructed to go to Er stat. Will call er nurse. Pt was seen by Dr. Elisabeth Pigeon 12/21/12 for 2 weeks hx constipation and prescribed Miralax with effective results.

## 2012-12-24 NOTE — Telephone Encounter (Signed)
Pt chose POV to the ER.pt is in stable condition. Report given to [facility]

## 2012-12-24 NOTE — ED Notes (Signed)
Pt with hx of colon ca was sent here from Dr's office for rectal bleeding.  Was being tx for constipation x 2 days.  This morning he noticed dark clots along with bright red blood. Hx of colon surgery in May to remove colon ca. Denies nausea, but c/o weakness and RLQ abdominal pain.

## 2012-12-24 NOTE — ED Notes (Signed)
Pt had another BM, upon evaluation Pt had moderate amount of bright red blood in the toilet. No clots noted. MD notified

## 2012-12-25 NOTE — Consult Note (Signed)
I have seen and examined the pt and agree with PA-Osborne's progress note. F/u in clinic

## 2012-12-31 ENCOUNTER — Ambulatory Visit (INDEPENDENT_AMBULATORY_CARE_PROVIDER_SITE_OTHER): Payer: Medicare Other | Admitting: Surgery

## 2012-12-31 ENCOUNTER — Encounter: Payer: Self-pay | Admitting: Gastroenterology

## 2012-12-31 ENCOUNTER — Other Ambulatory Visit (INDEPENDENT_AMBULATORY_CARE_PROVIDER_SITE_OTHER): Payer: Self-pay

## 2012-12-31 ENCOUNTER — Encounter (INDEPENDENT_AMBULATORY_CARE_PROVIDER_SITE_OTHER): Payer: Self-pay | Admitting: Surgery

## 2012-12-31 VITALS — BP 130/84 | HR 72 | Temp 97.6°F | Resp 14 | Ht 76.0 in | Wt 207.4 lb

## 2012-12-31 DIAGNOSIS — K649 Unspecified hemorrhoids: Secondary | ICD-10-CM

## 2012-12-31 DIAGNOSIS — K625 Hemorrhage of anus and rectum: Secondary | ICD-10-CM

## 2012-12-31 NOTE — Progress Notes (Signed)
[name] 67 y.o.  Body mass index is 25.26 kg/(m^2).  Patient Active Problem List   Diagnosis Date Noted  . Insomnia 09/30/2012  . History of hepatitis B 09/09/2012  . Colon cancer 07/28/2012  . Thrombocytopenia 11/04/2011  . Chest pain 11/04/2011  . Chronic systolic heart failure 11/03/2011  . 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 ALCOHOL ABUSE 01/18/2009  . TOBACCO ABUSE 01/18/2009  . DEPRESSION 01/17/2009  . HEMORRHOIDS 01/17/2009    No Known Allergies  Past Surgical History  Procedure Laterality Date  . Mouth surgery    . Small intestine surgery    . Colon surgery    . Arterial bypass surgry     Manson Passey, MD No diagnosis found.  Mr. Burrous comes in today in followup after a recent bout with significant bleeding from present hemorrhoids. He is about 6 months out from a sigmoid colectomy from a polyp that was removed. The worrisome history about this recent episode was that when he finally was able to open up he passed some dark clots with stool. For just setting him up for a hemorrhoid resection of think it made a good idea to make sure he he has a clean colon and no evidence of any tumor proximal bleeding in the right colon that could have contributed to bleeding. We'll refer him to the lower GI consideration of a colonoscopy to clear the right colon and rule out other sources of bleeding that would be more significant than hemorrhoid disease. If this is normal then I would proceed with hemorrhoidectomy.  Plan refer to the lower GI for colonoscopy. Followup after that to consider hemorrhoidal intervention under general anesthesia. Matt B. Daphine Deutscher, MD, [facility] [phone] beeper [phone]  12/31/2012 12:04 PM

## 2013-01-04 ENCOUNTER — Telehealth: Payer: Self-pay | Admitting: Emergency Medicine

## 2013-01-04 NOTE — Telephone Encounter (Signed)
Pt requesting script refill Ativan. States he will be running out in 3 dys

## 2013-01-05 ENCOUNTER — Encounter (HOSPITAL_COMMUNITY): Payer: Self-pay | Admitting: Emergency Medicine

## 2013-01-05 ENCOUNTER — Emergency Department (HOSPITAL_COMMUNITY)
Admission: EM | Admit: 2013-01-05 | Discharge: 2013-01-05 | Discharge status: Against Medical Advice | Payer: Medicare Other | Attending: Emergency Medicine | Admitting: Emergency Medicine

## 2013-01-05 DIAGNOSIS — I2510 Atherosclerotic heart disease of native coronary artery without angina pectoris: Secondary | ICD-10-CM | POA: Insufficient documentation

## 2013-01-05 DIAGNOSIS — Z7982 Long term (current) use of aspirin: Secondary | ICD-10-CM | POA: Insufficient documentation

## 2013-01-05 DIAGNOSIS — I4891 Unspecified atrial fibrillation: Secondary | ICD-10-CM | POA: Insufficient documentation

## 2013-01-05 DIAGNOSIS — Z8619 Personal history of other infectious and parasitic diseases: Secondary | ICD-10-CM | POA: Insufficient documentation

## 2013-01-05 DIAGNOSIS — R011 Cardiac murmur, unspecified: Secondary | ICD-10-CM | POA: Insufficient documentation

## 2013-01-05 DIAGNOSIS — F3289 Other specified depressive episodes: Secondary | ICD-10-CM | POA: Insufficient documentation

## 2013-01-05 DIAGNOSIS — Z79899 Other long term (current) drug therapy: Secondary | ICD-10-CM | POA: Insufficient documentation

## 2013-01-05 DIAGNOSIS — I10 Essential (primary) hypertension: Secondary | ICD-10-CM | POA: Insufficient documentation

## 2013-01-05 DIAGNOSIS — F17200 Nicotine dependence, unspecified, uncomplicated: Secondary | ICD-10-CM | POA: Insufficient documentation

## 2013-01-05 DIAGNOSIS — Z85038 Personal history of other malignant neoplasm of large intestine: Secondary | ICD-10-CM | POA: Insufficient documentation

## 2013-01-05 DIAGNOSIS — F329 Major depressive disorder, single episode, unspecified: Secondary | ICD-10-CM | POA: Insufficient documentation

## 2013-01-05 DIAGNOSIS — Z951 Presence of aortocoronary bypass graft: Secondary | ICD-10-CM | POA: Insufficient documentation

## 2013-01-05 LAB — BASIC METABOLIC PANEL
BUN: 13 mg/dL (ref 6–23)
CO2: 23 mEq/L (ref 19–32)
Calcium: 9.9 mg/dL (ref 8.4–10.5)
Chloride: 102 mEq/L (ref 96–112)
Creatinine, Ser: 0.89 mg/dL (ref 0.50–1.35)
GFR calc Af Amer: >90 mL/min (ref >90)
GFR calc non Af Amer: 87 mL/min — ABNORMAL LOW (ref >90)
Glucose, Bld: 90 mg/dL (ref 70–99)
Potassium: 4.3 mEq/L (ref 3.5–5.1)
Sodium: 135 mEq/L (ref 135–145)

## 2013-01-05 LAB — CBC WITH DIFFERENTIAL/PLATELET
Basophils Absolute: 0 10*3/uL (ref 0.0–0.1)
Basophils Relative: 0 % (ref 0–1)
Eosinophils Absolute: 0.2 10*3/uL (ref 0.0–0.7)
Eosinophils Relative: 2 % (ref 0–5)
HCT: 44.8 % (ref 39.0–52.0)
Hemoglobin: 15.6 g/dL (ref 13.0–17.0)
Lymphocytes Relative: 33 % (ref 12–46)
Lymphs Abs: 3.7 10*3/uL (ref 0.7–4.0)
MCH: 30.1 pg (ref 26.0–34.0)
MCHC: 34.8 g/dL (ref 30.0–36.0)
MCV: 86.5 fL (ref 78.0–100.0)
Monocytes Absolute: 1 10*3/uL (ref 0.1–1.0)
Monocytes Relative: 9 % (ref 3–12)
Neutro Abs: 6.5 10*3/uL (ref 1.7–7.7)
Neutrophils Relative %: 57 % (ref 43–77)
Platelets: 194 10*3/uL (ref 150–400)
RBC: 5.18 MIL/uL (ref 4.22–5.81)
RDW: 15.7 % — ABNORMAL HIGH (ref 11.5–15.5)
WBC: 11.4 10*3/uL — ABNORMAL HIGH (ref 4.0–10.5)

## 2013-01-05 LAB — TROPONIN I: Troponin I: <0.3 ng/mL (ref <0.30)

## 2013-01-05 LAB — MAGNESIUM: Magnesium: 1.9 mg/dL (ref 1.5–2.5)

## 2013-01-05 MED ORDER — DILTIAZEM HCL ER COATED BEADS 120 MG PO CP24: 120.0000 mg | ORAL_CAPSULE | Freq: Every day | ORAL | Status: DC

## 2013-01-05 MED ORDER — DILTIAZEM HCL 100 MG IV SOLR
5.0000 mg/h | Freq: Once | INTRAVENOUS | Status: AC
  Administered 2013-01-05: 15 mg/h via INTRAVENOUS
  Filled 2013-01-05: qty 100

## 2013-01-05 MED ORDER — SODIUM CHLORIDE 0.9 % IV SOLN
Freq: Once | INTRAVENOUS | Status: AC
  Administered 2013-01-05: 12:00:00 via INTRAVENOUS

## 2013-01-05 NOTE — ED Provider Notes (Signed)
CSN: [id number]     Arrival date & time 01/05/13  1059 History   First MD Initiated Contact with Patient 01/05/13 1115     Chief Complaint  Patient presents with  . Dizziness   (Consider location/radiation/quality/duration/timing/severity/associated sxs/prior Treatment) HPI  67 year old male with dizziness. Onset around 6:00 today. Noticed when he first woke up. Persistent since then. Patient has history of atrial fibrillation. States that current symptoms feel similar to when he's been afebrile in the past. As you known CAD status post CABG within the past year. Shortness of breath with exertion. Denies any chest pain. No unusual leg pain or swelling. Is a smoker. Past history of alcohol abuse, but states that he has been sober for the past months. Takes 81 mg of aspirin daily. Is not currently on any rate controlling medications.  Past Medical History  Diagnosis Date  . Atrial fibrillation     diagnosed 2010  . Angina   . Hepatitis     hepatitis B-never treated-Titers were low  . Hypertension   . 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 03/21/2011 - 2D Echo: EF 25% to 30%. Diffuse hypokinesis.  Marland Kitchen HEMORRHOIDS 01/17/2009  . DEPRESSION 01/17/2009  . ALCOHOL ABUSE 01/18/2009  . Heart murmur   . Insomnia   . Cancer     Colon   Past Surgical History  Procedure Laterality Date  . Mouth surgery    . Small intestine surgery    . Colon surgery    . Arterial bypass surgry     Family History  Problem Relation Age of Onset  . Heart disease Neg Hx   . Asthma Mother    History  Substance Use Topics  . Smoking status: Current Every Day Smoker -- 1.00 packs/day for 40 years    Types: Cigarettes  . Smokeless tobacco: Never Used  . Alcohol Use: No     Comment: Last consumption of alcohol-3 months ago.    Review of Systems  All systems reviewed and negative, other than as noted in HPI.   Allergies  Review of patient's allergies indicates no known allergies.  Home  Medications   Current Outpatient Rx  Name  Route  Sig  Dispense  Refill  . aspirin 81 MG tablet   Oral   Take 81 mg by mouth every morning.          . bisacodyl (DULCOLAX) 10 MG suppository   Rectal   Place 1 suppository (10 mg total) rectally daily as needed for constipation.   30 suppository   0   . docusate sodium (COLACE) 100 MG capsule   Oral   Take 1 capsule (100 mg total) by mouth 2 (two) times daily.   60 capsule   3   . LORazepam (ATIVAN) 2 MG tablet   Oral   Take 2 mg by mouth at bedtime.         . Multiple Vitamin (MULTIVITAMIN) capsule   Oral   Take 1 capsule by mouth daily.         . polyethylene glycol powder (GLYCOLAX/MIRALAX) powder   Oral   Take 17 g by mouth 4 (four) times daily.   3350 g   3   . Zinc Sulfate (ZINC 15 PO)   Oral   Take 15 mg by mouth every other day.          BP 108/77  Pulse 118  Resp 18  SpO2 92% Physical Exam  Nursing note and  vitals reviewed. Constitutional: He appears well-developed and well-nourished. No distress.  HENT:  Head: Normocephalic and atraumatic.  Eyes: Conjunctivae are normal. Right eye exhibits no discharge. Left eye exhibits no discharge.  Neck: Neck supple.  Cardiovascular: Regular rhythm and normal heart sounds.  Exam reveals no gallop and no friction rub.   No murmur heard. Tachycardic. Regularly irregular rhythm.   Pulmonary/Chest: Effort normal and breath sounds normal. No respiratory distress.  Abdominal: Soft. He exhibits no distension. There is no tenderness.  Musculoskeletal: He exhibits no edema and no tenderness.  Lower extremities symmetric as compared to each other. No calf tenderness. Negative Homan's. No palpable cords.   Neurological: He is alert.  Skin: Skin is warm and dry.  Psychiatric: He has a normal mood and affect. His behavior is normal. Thought content normal.    ED Course  Procedures (including critical care time) Labs Review Labs Reviewed  CBC WITH DIFFERENTIAL  - Abnormal; Notable for the following:    WBC 11.4 (*)    RDW 15.7 (*)    All other components within normal limits  BASIC METABOLIC PANEL - Abnormal; Notable for the following:    GFR calc non Af Amer 87 (*)    All other components within normal limits  MAGNESIUM  TROPONIN I   Imaging Review No results found.  EKG Interpretation     Ventricular Rate:  135 PR Interval:    QRS Duration: 97 QT Interval:  318 QTC Calculation: 477 R Axis:   -52 Text Interpretation:  Atrial fibrillation Ventricular premature complex Inferior infarct, old ED PHYSICIAN INTERPRETATION AVAILABLE IN [facility]            MDM   1. Atrial fibrillation with RVR    67yM with afib with RVR. Interactions with pt difficult. Was not hostile, but very stubborn. Unhappy with previous cardiology care an unwilling to be hospitalized or have someone see him in consult in the ED. Pt seems to have previously been on vrious rate controlling medications (diltiazem, metoprolol) as well as xarelto for his hx of afib. Pt gives various explanations as to why he doesn't take them anymore. Hx of ETOH abuse and medication noncompliance. Pt now living with ex-wife and has been sober for several months. Discussed that may potentially be a candidate for anticoagulation again. Pt not interested in this. Discussed risks of stroke, permanent disability, etc. He has medical decision making capability. Pt instructed to increase aspirin to 325mg  daily. Will place on low dose cardizem. Given contact information for various cardiologists and encouraged to follow-up with someone he thinks he may be more comfortable with as soon as possible.     Raeford Razor, MD 01/13/13 [phone]

## 2013-01-05 NOTE — ED Notes (Signed)
Pt states he woke up around 0600 today with dizziness. Hx of afib, has not been in afib since triple bypass surgery last year. Reports dyspnea with exertion, is a current every day smoker

## 2013-01-07 ENCOUNTER — Telehealth: Payer: Self-pay | Admitting: Internal Medicine

## 2013-01-07 ENCOUNTER — Other Ambulatory Visit: Payer: Self-pay | Admitting: Internal Medicine

## 2013-01-07 MED ORDER — LORAZEPAM 2 MG PO TABS: 2.0000 mg | ORAL_TABLET | Freq: Every day | ORAL | Status: DC

## 2013-01-21 ENCOUNTER — Encounter: Payer: Self-pay | Admitting: Gastroenterology

## 2013-01-22 ENCOUNTER — Ambulatory Visit (INDEPENDENT_AMBULATORY_CARE_PROVIDER_SITE_OTHER): Payer: Medicare Other | Admitting: Physician Assistant

## 2013-01-22 ENCOUNTER — Telehealth: Payer: Self-pay | Admitting: Cardiology

## 2013-01-22 ENCOUNTER — Encounter: Payer: Self-pay | Admitting: Physician Assistant

## 2013-01-22 VITALS — BP 120/74 | HR 64 | Ht 76.0 in | Wt 210.0 lb

## 2013-01-22 DIAGNOSIS — K649 Unspecified hemorrhoids: Secondary | ICD-10-CM

## 2013-01-22 DIAGNOSIS — K625 Hemorrhage of anus and rectum: Secondary | ICD-10-CM

## 2013-01-22 DIAGNOSIS — C189 Malignant neoplasm of colon, unspecified: Secondary | ICD-10-CM

## 2013-01-22 DIAGNOSIS — Z1211 Encounter for screening for malignant neoplasm of colon: Secondary | ICD-10-CM

## 2013-01-22 DIAGNOSIS — Z85038 Personal history of other malignant neoplasm of large intestine: Secondary | ICD-10-CM

## 2013-01-22 DIAGNOSIS — Z951 Presence of aortocoronary bypass graft: Secondary | ICD-10-CM

## 2013-01-22 MED ORDER — MOVIPREP 100 G PO SOLR: 1.0000 | Freq: Once | ORAL | Status: DC

## 2013-01-22 NOTE — Patient Instructions (Addendum)
Take Miralax 17 grams in a glass of juice or water twice daily. You have been scheduled for a colonoscopy with propofol. Please follow written instructions given to you at your visit today.  Please pick up your prep kit at the pharmacy within the next 1-3 days. The location will be [facility]. Go to registration inside the front door of the hospital.

## 2013-01-22 NOTE — Progress Notes (Signed)
Subjective:    Patient ID: [name], male    DOB: 10/24/1945, 67 y.o.   MRN: [id number]  HPI  [name] is a 67-year-old male referred today per Dr. Matt Martin for evaluation of rectal bleeding and colonoscopy. Patient has history of hemorrhoids and and had any ER visit in October of 2014 due to rectal bleeding. He was seen by surgery at that time and felt to be bleeding from internal hemorrhoids. He then saw Dr. Martin in the office and was to be set up for a hemorrhoidectomy however patient had undergone a sigmoid colectomy in May of 2014 in [place] for a sigmoid colon cancer and had colonoscopy in April of 2014 at which time the diagnosis was made. Dr. Martin is requesting followup colonoscopy to clear his colon prior to scheduling surgery. We have obtained some of his records from [place] and he had colonoscopy on 06/30/2012 as well as an endoscopy. He had mild gastritis on EGD and on colonoscopy had a 1.5 cm friable sessile polyp in the sigmoid colon at 25 cm. 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 He then had a rectosigmoid resection and then hemorrhoid banding done at that same time of the left lateral hemorrhoidal complex. Patient states that he has had some intermittent bleeding since his surgery but bled more actively in October. He is now taking MiraLax 4 times daily in addition to taking Colace and says that he has not had any rectal pain or bleeding in the past week or so. He is having very frequent bowel movements however. He says he has to go to the bathroom every time he eats. He has no complaints of abdominal pain. Hemoglobin at last check was 15.8. Patient does have history of COPD. prior history of EtOH abuse which he says is not active  prior hepatitis B and a dilated cardiomyopathy with previous EF at 25-30% when documented by echo in January 2013. Patient states that he had  atrial fibrillation as well. He had further evaluation after moving to [place] last year and had a CABG in October of 2013. He says he has felt much better since that time and has not had any cardiac issues. He did have a brief episode of atrial for about 2 weeks ago was placed on Cardizem which  has controlled his rate. I do not have his cardiac records from [place] available today for review.    Review of Systems  Constitutional: Negative.   HENT: Negative.   Eyes: Negative.   Respiratory: Negative.   Cardiovascular: Negative.   Gastrointestinal: Positive for anal bleeding.  Endocrine: Negative.   Genitourinary: Negative.   Musculoskeletal: Negative.   Allergic/Immunologic: Negative.   Neurological: Negative.   Hematological: Negative.   Psychiatric/Behavioral: Negative.    Outpatient Prescriptions Prior to Visit  Medication Sig Dispense Refill  . diltiazem (CARDIZEM CD) 120 MG 24 hr capsule Take 1 capsule (120 mg total) by mouth daily.  30 capsule  0  . docusate sodium (COLACE) 100 MG capsule Take 1 capsule (100 mg total) by mouth 2 (two) times daily.  60 capsule  3  . LORazepam (ATIVAN) 2 MG tablet Take 1 tablet (2 mg total) by mouth at bedtime.  30 tablet  0  . Multiple Vitamin (MULTIVITAMIN) capsule Take 1 capsule by mouth daily.      . polyethylene glycol powder (GLYCOLAX/MIRALAX) powder Take 17 g by mouth 4 (four) times daily.    3350 g  3  . Zinc Sulfate (ZINC 15 PO) Take 15 mg by mouth every other day.      . aspirin 81 MG tablet Take 81 mg by mouth every morning.       . bisacodyl (DULCOLAX) 10 MG suppository Place 1 suppository (10 mg total) rectally daily as needed for constipation.  30 suppository  0   No facility-administered medications prior to visit.   No Known Allergies Patient Active Problem List   Diagnosis Date Noted  . Hx of CABG 01/22/2013  . Insomnia 09/30/2012  . History of hepatitis B 09/09/2012  . Colon cancer 07/28/2012  . Thrombocytopenia  11/04/2011  . Chest pain 11/04/2011  . Chronic systolic heart failure 11/03/2011  . 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 ALCOHOL ABUSE 01/18/2009  . TOBACCO ABUSE 01/18/2009  . DEPRESSION 01/17/2009  . HEMORRHOIDS 01/17/2009   History  Substance Use Topics  . Smoking status: Current Every Day Smoker -- 1.00 packs/day for 40 years    Types: Cigarettes  . Smokeless tobacco: Never Used  . Alcohol Use: No     Comment: Last consumption of alcohol-3 months ago.   family history includes Asthma in his mother. There is no history of Heart disease.   Objective:   Physical Exam   WD older WM in no acute distress. Blood pressure 120/74 pulse 64 height 6 foot 4 inches weight 210. HEENT; nontraumatic normocephalic EOMI PERRLA sclera anicteric, Supple; no JVD, Cardiovascular; regular rate and rhythm with S1-S2 no murmur or gallop he does have a sternal incisional scar, Pulmonary; clear bilaterally, Abdomen; soft nontender nondistended bowel sounds are active no palpable mass or hepatosplenomegaly, Rectal; not done he was examined in the ER with internal hemorrhoids noted., Extremities; no clubbing cyanosis or edema, Psych ;mood and affect normal and appropriate.      Assessment & Plan:    #1  67-year-old male with intermittent rectal bleeding presumed secondary to internal hemorrhoids. Patient has history of hemorrhoidal bleeding and did have a banding of internal hemorrhoids done at the time of his colon surgery May 2014. 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 Jacobs. Procedure  discussed in detail with patient who is agreeable to proceed Decrease MiraLax to 17 g twice daily Further plans pending findings at colonoscopy   

## 2013-01-22 NOTE — Telephone Encounter (Signed)
Rec'd from [facility] forward 9 pages to [facility]

## 2013-01-25 NOTE — Progress Notes (Signed)
i agree with the plan noted above 

## 2013-02-01 ENCOUNTER — Other Ambulatory Visit: Payer: Self-pay | Admitting: Emergency Medicine

## 2013-02-01 ENCOUNTER — Ambulatory Visit: Payer: Medicare Other | Attending: Internal Medicine

## 2013-02-01 MED ORDER — LORAZEPAM 2 MG PO TABS: 2.0000 mg | ORAL_TABLET | Freq: Every day | ORAL | Status: DC

## 2013-02-01 MED ORDER — DILTIAZEM HCL ER COATED BEADS 120 MG PO CP24: 120.0000 mg | ORAL_CAPSULE | Freq: Every day | ORAL | Status: DC

## 2013-02-08 ENCOUNTER — Telehealth: Payer: Self-pay | Admitting: Gastroenterology

## 2013-02-08 NOTE — Telephone Encounter (Signed)
Pt had a question about a bill he received for medical records, he was transferred to the medical records dept

## 2013-02-09 ENCOUNTER — Telehealth: Payer: Self-pay | Admitting: Gastroenterology

## 2013-02-09 NOTE — Telephone Encounter (Signed)
The patient called wanting to be assured that we received records from [facility].  He complained that he had received a bill from [employer] for copies from [place] to [facility] for $10.00.  After looking at his chart I determined the release form was filled out without [place]'s information on the line for where we were requesting copies from.  I did however find a fax cover sheet from [facility] that was scanned by the scan center on 02/06/13.  I talked to Patty in GI who talked to [facility] to confirm that records were received from them and sent to scanning.  I called the scan center who said they could only find the fax cover sheet.  We looked through the material we have here to prep for the scan center and couldn't locate the copies.  The scan center said they would continue to look through the documents they receive today.    We do have records from [facility] ([facility]) which were scanned in June 2014.  I called the patient and asked him if he had returned to [facility] since June and he said he had not.  I explained that we had received records in June and in November but at this time couldn't locate the copies sent in November.  I informed Mr. Volker that I had asked [employer] to waive the charges and apologized for the error.

## 2013-02-11 ENCOUNTER — Ambulatory Visit (HOSPITAL_COMMUNITY)
Admission: RE | Admit: 2013-02-11 | Discharge: 2013-02-11 | Disposition: A | Discharge status: Home / Self Care | Payer: Medicare Other | Source: Ambulatory Visit | Attending: Gastroenterology | Admitting: Gastroenterology

## 2013-02-11 ENCOUNTER — Encounter (HOSPITAL_COMMUNITY)
Admission: RE | Disposition: A | Discharge status: Home / Self Care | Payer: Self-pay | Source: Ambulatory Visit | Attending: Gastroenterology

## 2013-02-11 ENCOUNTER — Encounter (HOSPITAL_COMMUNITY): Payer: Self-pay | Admitting: Gastroenterology

## 2013-02-11 DIAGNOSIS — K625 Hemorrhage of anus and rectum: Secondary | ICD-10-CM

## 2013-02-11 DIAGNOSIS — I428 Other cardiomyopathies: Secondary | ICD-10-CM | POA: Insufficient documentation

## 2013-02-11 DIAGNOSIS — Z1211 Encounter for screening for malignant neoplasm of colon: Secondary | ICD-10-CM

## 2013-02-11 DIAGNOSIS — Z79899 Other long term (current) drug therapy: Secondary | ICD-10-CM | POA: Insufficient documentation

## 2013-02-11 DIAGNOSIS — J449 Chronic obstructive pulmonary disease, unspecified: Secondary | ICD-10-CM | POA: Insufficient documentation

## 2013-02-11 DIAGNOSIS — Z85038 Personal history of other malignant neoplasm of large intestine: Secondary | ICD-10-CM

## 2013-02-11 DIAGNOSIS — Z980 Intestinal bypass and anastomosis status: Secondary | ICD-10-CM | POA: Insufficient documentation

## 2013-02-11 DIAGNOSIS — Z9049 Acquired absence of other specified parts of digestive tract: Secondary | ICD-10-CM | POA: Insufficient documentation

## 2013-02-11 DIAGNOSIS — C187 Malignant neoplasm of sigmoid colon: Secondary | ICD-10-CM | POA: Insufficient documentation

## 2013-02-11 DIAGNOSIS — K644 Residual hemorrhoidal skin tags: Secondary | ICD-10-CM | POA: Insufficient documentation

## 2013-02-11 DIAGNOSIS — Z7982 Long term (current) use of aspirin: Secondary | ICD-10-CM | POA: Insufficient documentation

## 2013-02-11 DIAGNOSIS — Z951 Presence of aortocoronary bypass graft: Secondary | ICD-10-CM | POA: Insufficient documentation

## 2013-02-11 DIAGNOSIS — I4891 Unspecified atrial fibrillation: Secondary | ICD-10-CM | POA: Insufficient documentation

## 2013-02-11 DIAGNOSIS — C189 Malignant neoplasm of colon, unspecified: Secondary | ICD-10-CM

## 2013-02-11 DIAGNOSIS — F1011 Alcohol abuse, in remission: Secondary | ICD-10-CM | POA: Insufficient documentation

## 2013-02-11 DIAGNOSIS — J4489 Other specified chronic obstructive pulmonary disease: Secondary | ICD-10-CM | POA: Insufficient documentation

## 2013-02-11 DIAGNOSIS — I2510 Atherosclerotic heart disease of native coronary artery without angina pectoris: Secondary | ICD-10-CM | POA: Insufficient documentation

## 2013-02-11 DIAGNOSIS — F17200 Nicotine dependence, unspecified, uncomplicated: Secondary | ICD-10-CM | POA: Insufficient documentation

## 2013-02-11 HISTORY — PX: COLONOSCOPY: SHX5424

## 2013-02-11 SURGERY — COLONOSCOPY
Anesthesia: Moderate Sedation

## 2013-02-11 MED ORDER — MIDAZOLAM HCL 5 MG/5ML IJ SOLN: INTRAMUSCULAR | Status: DC | PRN

## 2013-02-11 MED ORDER — SODIUM CHLORIDE 0.9 % IV SOLN
INTRAVENOUS | Status: DC
  Administered 2013-02-11: 500 mL via INTRAVENOUS

## 2013-02-11 MED ORDER — DIPHENHYDRAMINE HCL 50 MG/ML IJ SOLN
INTRAMUSCULAR | Status: AC
  Filled 2013-02-11: qty 1

## 2013-02-11 MED ORDER — FENTANYL CITRATE 0.05 MG/ML IJ SOLN
INTRAMUSCULAR | Status: DC | PRN
  Administered 2013-02-11 (×2): 25 ug via INTRAVENOUS

## 2013-02-11 MED ORDER — MIDAZOLAM HCL 10 MG/2ML IJ SOLN
INTRAMUSCULAR | Status: DC | PRN
  Administered 2013-02-11: 1 mg via INTRAVENOUS
  Administered 2013-02-11 (×2): 2 mg via INTRAVENOUS
  Administered 2013-02-11: 1 mg via INTRAVENOUS

## 2013-02-11 MED ORDER — MIDAZOLAM HCL 10 MG/2ML IJ SOLN
INTRAMUSCULAR | Status: AC
  Filled 2013-02-11: qty 2

## 2013-02-11 MED ORDER — FENTANYL CITRATE 0.05 MG/ML IJ SOLN
INTRAMUSCULAR | Status: AC
  Filled 2013-02-11: qty 2

## 2013-02-11 NOTE — Op Note (Signed)
[facility] [address] [place], [address]   COLONOSCOPY PROCEDURE REPORT  PATIENT: [name], [name]  MR#: [id number] BIRTHDATE: 1945/03/29 , 67  yrs. old GENDER: Male ENDOSCOPIST: Rachael Fee, MD REFERRED ON:GEXBMWU Daphine Deutscher, M.D. PROCEDURE DATE:  02/11/2013 PROCEDURE:   Colonoscopy, screening First Screening Colonoscopy - Avg.  risk and is 50 yrs.  old or older - No.  Prior Negative Screening - Now for repeat screening. N/A  History of Adenoma - Now for follow-up colonoscopy & has been > or = to 3 yrs.  N/A  Polyps Removed Today? No.  Recommend repeat exam, <10 yrs? Yes.  High risk (family or personal hx). ASA CLASS:   Class IV INDICATIONS:Stage 1 colon cancer, diagnosed and treated (surgery only) elsewhere in May 2014.  Intermittent rectal bleeding.Marland Kitchen MEDICATIONS: Fentanyl 50 mcg IV and Versed 6 mg IV  DESCRIPTION OF PROCEDURE:   After the risks benefits and alternatives of the procedure were thoroughly explained, informed consent was obtained.  A digital rectal exam revealed no abnormalities of the rectum.   The Pentax Colonoscope [id number] endoscope was introduced through the anus and advanced to the cecum, which was identified by both the appendix and ileocecal valve. No adverse events experienced.   The quality of the prep was adequate.  The instrument was then slowly withdrawn as the colon was fully examined.  COLON FINDINGS: There was a normal colo-colonic anastomosis at 15cm from anus.  The examination was otherwise normal except for medium sized internal, external hemorrhoids.  Retroflexed views revealed no abnormalities. The time to cecum=3 minutes 00 seconds. Withdrawal time=10 minutes 00 seconds.  The scope was withdrawn and the procedure completed. COMPLICATIONS: There were no complications.  ENDOSCOPIC IMPRESSION: There was a normal colo-colonic anastomosis at 15cm from anus.  The examination was otherwise normal except for medium sized  internal, external hemorrhoids.  RECOMMENDATIONS: Given your personal history of adenomatous (pre-cancerous) polyps, you will need a repeat colonoscopy in 3 years.   eSigned:  Rachael Fee, MD 02/11/2013 11:06 AM

## 2013-02-11 NOTE — H&P (View-Only) (Signed)
Subjective:    Patient ID: [name], male    DOB: 06-Mar-1946, 67 y.o.   MRN: [id number]  HPI  [name] is a 67 year old male referred today per Dr. Wenda Low for evaluation of rectal bleeding and colonoscopy. Patient has history of hemorrhoids and and had any ER visit in October of 2014 due to rectal bleeding. He was seen by surgery at that time and felt to be bleeding from internal hemorrhoids. He then saw Dr. Daphine Deutscher in the office and was to be set up for a hemorrhoidectomy however patient had undergone a sigmoid colectomy in May of 2014 in [place] for a sigmoid colon cancer and had colonoscopy in April of 2014 at which time the diagnosis was made. Dr. Daphine Deutscher is requesting followup colonoscopy to clear his colon prior to scheduling surgery. We have obtained some of his records from [place] and he had colonoscopy on 06/30/2012 as well as an endoscopy. He had mild gastritis on EGD and on colonoscopy had a 1.5 cm friable sessile polyp in the sigmoid colon at 25 cm. 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 He then had a rectosigmoid resection and then hemorrhoid banding done at that same time of the left lateral hemorrhoidal complex. Patient states that he has had some intermittent bleeding since his surgery but bled more actively in October. He is now taking MiraLax 4 times daily in addition to taking Colace and says that he has not had any rectal pain or bleeding in the past week or so. He is having very frequent bowel movements however. He says he has to go to the bathroom every time he eats. He has no complaints of abdominal pain. Hemoglobin at last check was 15.8. Patient does have history of COPD. prior history of EtOH abuse which he says is not active  prior hepatitis B and a dilated cardiomyopathy with previous EF at 25-30% when documented by echo in January 2013. Patient states that he had  atrial fibrillation as well. He had further evaluation after moving to [place] last year and had a CABG in October of 2013. He says he has felt much better since that time and has not had any cardiac issues. He did have a brief episode of atrial for about 2 weeks ago was placed on Cardizem which  has controlled his rate. I do not have his cardiac records from [facility] available today for review.    Review of Systems  Constitutional: Negative.   HENT: Negative.   Eyes: Negative.   Respiratory: Negative.   Cardiovascular: Negative.   Gastrointestinal: Positive for anal bleeding.  Endocrine: Negative.   Genitourinary: Negative.   Musculoskeletal: Negative.   Allergic/Immunologic: Negative.   Neurological: Negative.   Hematological: Negative.   Psychiatric/Behavioral: Negative.    Outpatient Prescriptions Prior to Visit  Medication Sig Dispense Refill  . diltiazem (CARDIZEM CD) 120 MG 24 hr capsule Take 1 capsule (120 mg total) by mouth daily.  30 capsule  0  . docusate sodium (COLACE) 100 MG capsule Take 1 capsule (100 mg total) by mouth 2 (two) times daily.  60 capsule  3  . LORazepam (ATIVAN) 2 MG tablet Take 1 tablet (2 mg total) by mouth at bedtime.  30 tablet  0  . Multiple Vitamin (MULTIVITAMIN) capsule Take 1 capsule by mouth daily.      . polyethylene glycol powder (GLYCOLAX/MIRALAX) powder Take 17 g by mouth 4 (four) times daily.  3350 g  3  . Zinc Sulfate (ZINC 15 PO) Take 15 mg by mouth every other day.      Marland Kitchen aspirin 81 MG tablet Take 81 mg by mouth every morning.       . bisacodyl (DULCOLAX) 10 MG suppository Place 1 suppository (10 mg total) rectally daily as needed for constipation.  30 suppository  0   No facility-administered medications prior to visit.   No Known Allergies Patient Active Problem List   Diagnosis Date Noted  . Hx of CABG 01/22/2013  . Insomnia 09/30/2012  . History of hepatitis B 09/09/2012  . Colon cancer 07/28/2012  . Thrombocytopenia  11/04/2011  . Chest pain 11/04/2011  . Chronic systolic heart failure 11/03/2011  . 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 ALCOHOL ABUSE 01/18/2009  . TOBACCO ABUSE 01/18/2009  . DEPRESSION 01/17/2009  . HEMORRHOIDS 01/17/2009   History  Substance Use Topics  . Smoking status: Current Every Day Smoker -- 1.00 packs/day for 40 years    Types: Cigarettes  . Smokeless tobacco: Never Used  . Alcohol Use: No     Comment: Last consumption of alcohol-3 months ago.   family history includes Asthma in his mother. There is no history of Heart disease.   Objective:   Physical Exam   WD older WM in no acute distress. Blood pressure 120/74 pulse 64 height 6 foot 4 inches weight 210. HEENT; nontraumatic normocephalic EOMI PERRLA sclera anicteric, Supple; no JVD, Cardiovascular; regular rate and rhythm with S1-S2 no murmur or gallop he does have a sternal incisional scar, Pulmonary; clear bilaterally, Abdomen; soft nontender nondistended bowel sounds are active no palpable mass or hepatosplenomegaly, Rectal; not done he was examined in the ER with internal hemorrhoids noted., Extremities; no clubbing cyanosis or edema, Psych ;mood and affect normal and appropriate.      Assessment & Plan:    #5  67 year old male with intermittent rectal bleeding presumed secondary to internal hemorrhoids. Patient has history of hemorrhoidal bleeding and did have a banding of internal hemorrhoids done at the time of his colon surgery May 2014. 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 Christella Hartigan. Procedure  discussed in detail with patient who is agreeable to proceed Decrease MiraLax to 17 g twice daily Further plans pending findings at colonoscopy

## 2013-02-11 NOTE — Interval H&P Note (Signed)
History and Physical Interval Note:  02/11/2013 9:43 AM  [name]  has presented today for surgery, with the diagnosis of Personal history of colon cancer V10.05 Colon cancer screening V76.51  The various methods of treatment have been discussed with the patient and family. After consideration of risks, benefits and other options for treatment, the patient has consented to  Procedure(s): COLONOSCOPY (N/A) as a surgical intervention .  The patient's history has been reviewed, patient examined, no change in status, stable for surgery.  I have reviewed the patient's chart and labs.  Questions were answered to the patient's satisfaction.     Rachael Fee

## 2013-02-12 ENCOUNTER — Encounter (HOSPITAL_COMMUNITY): Payer: Self-pay | Admitting: Gastroenterology

## 2013-02-15 ENCOUNTER — Encounter: Payer: Medicare Other | Admitting: Gastroenterology

## 2013-02-16 ENCOUNTER — Telehealth (INDEPENDENT_AMBULATORY_CARE_PROVIDER_SITE_OTHER): Payer: Self-pay | Admitting: General Surgery

## 2013-02-16 NOTE — Telephone Encounter (Signed)
Pt called to report he has had his colonoscopy (Dr. Christella Hartigan) and nothing untoward was found.  Wants to proceed with surgery on his hems.  Please let him know if he needs another appt with Dr. Daphine Deutscher or if MM can send orders to schedulers.

## 2013-02-23 ENCOUNTER — Encounter: Payer: Medicare Other | Admitting: Gastroenterology

## 2013-02-26 ENCOUNTER — Encounter (INDEPENDENT_AMBULATORY_CARE_PROVIDER_SITE_OTHER): Payer: Self-pay | Admitting: Surgery

## 2013-02-26 ENCOUNTER — Ambulatory Visit (INDEPENDENT_AMBULATORY_CARE_PROVIDER_SITE_OTHER): Payer: Medicare Other | Admitting: Surgery

## 2013-02-26 VITALS — BP 134/88 | HR 76 | Temp 97.4°F | Resp 16 | Ht 76.0 in | Wt 209.2 lb

## 2013-02-26 DIAGNOSIS — K625 Hemorrhage of anus and rectum: Secondary | ICD-10-CM

## 2013-02-26 NOTE — Patient Instructions (Signed)
Hemorrhoidectomy Hemorrhoidectomy is surgery to remove hemorrhoids. Hemorrhoids are veins that have become swollen in the rectum. The rectum is the area from the bottom end of the intestines to the opening where bowel movements leave the body. Hemorrhoids can be uncomfortable. They can cause itching, bleeding and pain if a blood clot forms in them (thrombose). If hemorrhoids are small, surgery may not be needed. But if they cover a larger area, surgery is usually suggested.  LET YOUR CAREGIVER KNOW ABOUT:   Any allergies.  All medications you are taking, including:  Herbs, eyedrops, over-the-counter medications and creams.  Blood thinners (anticoagulants), aspirin or other drugs that could affect blood clotting.  Use of steroids (by mouth or as creams).  Previous problems with anesthetics, including local anesthetics.  Possibility of pregnancy, if this applies.  Any history of blood clots.  Any history of bleeding or other blood problems.  Previous surgery.  Smoking history.  Other health problems. RISKS AND COMPLICATIONS All surgery carries some risk. However, hemorrhoid surgery usually goes smoothly. Possible complications could include:  Urinary retention.  Bleeding.  Infection.  A painful incision.  A reaction to the anesthesia (this is not common). BEFORE THE PROCEDURE   Stop using aspirin and non-steroidal anti-inflammatory drugs (NSAIDs) for pain relief. This includes prescription drugs and over-the-counter drugs such as ibuprofen and naproxen. Also stop taking vitamin E. If possible, do this two weeks before your surgery.  If you take blood-thinners, ask your healthcare provider when you should stop taking them.  You will probably have blood and urine tests done several days before your surgery.  Do not eat or drink for about 8 hours before the surgery.  Arrive at least an hour before the surgery, or whenever your surgeon recommends. This will give you time to  check in and fill out any needed paperwork.  Hemorrhoidectomy is often an outpatient procedure. This means you will be able to go home the same day. Sometimes, though, people stay overnight in the hospital after the procedure. Ask your surgeon what to expect. Either way, make arrangements in advance for someone to drive you home. PROCEDURE   The preparation:  You will change into a hospital gown.  You will be given an IV. A needle will be inserted in your arm. Medication can flow directly into your body through this needle.  You might be given an enema to clear your rectum.  Once in the operating room, you will probably lie on your side or be repositioned later to lying on your stomach.  You will be given anesthesia (medication) so you will not feel anything during the surgery. The surgery often is done with local anesthesia (the area near the hemorrhoids will be numb and you will be drowsy but awake). Sometimes, general anesthesia is used (you will be asleep during the procedure).  The procedure:  There are a few different procedures for hemorrhoids. Be sure to ask you surgeon about the procedure, the risks and benefits.  Be sure to ask about what you need to do to take care of the wound, if there is one. AFTER THE PROCEDURE  You will stay in a recovery area until the anesthesia has worn off. Your blood pressure and pulse will be checked every so often.  You may feel a lot of pain in the area of the rectum.  Take all pain medication prescribed by your surgeon. Ask before taking any over-the-counter pain medicines.  Sometimes sitting in a warm bath can help relieve   your pain.  To make sure you have bowel movements without straining:  You will probably need to take stool softeners (usually a pill) for a few days.  You should drink 8 to 10 glasses of water each day.  Your activity will be restricted for awhile. Ask your caregiver for a list of what you should and should not do  while you recover. Document Released: 12/23/2008 Document Revised: 05/20/2011 Document Reviewed: 12/23/2008 Exit[employer] Patient Information 2014 [employer].  

## 2013-02-26 NOTE — Progress Notes (Signed)
Dr. Daphine Deutscher - Please enter preop orders in epic for Mr. Fiumara.   Thanks

## 2013-02-26 NOTE — Progress Notes (Signed)
Chief Complaint:  Bleeding hemorrhoids, recent colonoscopy  History of Present Illness:  [name] is an 67 y.o. male with him we've spoken about hemorrhoidectomy on a number of occasions in the past. He's had a recent colonoscopy which did not show any other cause for bleeding. His main problem is been his hemorrhoids come out bleed. He's been thinking about having them removed for sometime. I discussed exam under anesthesia with possible banding and/or excision of hemorrhoids. He was to go and get this scheduled so we will proceed.    Past Medical History  Diagnosis Date  . Atrial fibrillation     diagnosed 2010  . Angina   . Hepatitis     hepatitis B-never treated-Titers were low  . Hypertension   . 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 03/21/2011 - 2D Echo: EF 25% to 30%. Diffuse hypokinesis.  . HEMORRHOIDS 01/17/2009  . DEPRESSION 01/17/2009  . ALCOHOL ABUSE 01/18/2009  . Heart murmur   . Insomnia   . Colon cancer   . Coronary disease     Past Surgical History  Procedure Laterality Date  . Mouth surgery    . Small intestine surgery    . Colon surgery    . Arterial bypass surgry    . Coronary artery bypass graft      triple  bypass 2013  . Cardiac catheterization    . Colonoscopy N/A 02/11/2013    Procedure: COLONOSCOPY;  Surgeon: Daniel P Jacobs, MD;  Location: [facility];  Service: Endoscopy;  Laterality: N/A;    Current Outpatient Prescriptions  Medication Sig Dispense Refill  . aspirin 325 MG tablet Take 325 mg by mouth daily.      . diltiazem (CARDIZEM CD) 120 MG 24 hr capsule Take 1 capsule (120 mg total) by mouth daily.  30 capsule  2  . docusate sodium (COLACE) 100 MG capsule Take 1 capsule (100 mg total) by mouth 2 (two) times daily.  60 capsule  3  . LORazepam (ATIVAN) 2 MG tablet Take 1 tablet (2 mg total) by mouth at bedtime.  30 tablet  0  . Multiple Vitamin (MULTIVITAMIN) capsule Take 1 capsule by mouth daily.      . polyethylene glycol  powder (GLYCOLAX/MIRALAX) powder Take 17 g by mouth 4 (four) times daily.  3350 g  3  . Zinc Sulfate (ZINC 15 PO) Take 15 mg by mouth every other day.       No current facility-administered medications for this visit.   Review of patient's allergies indicates no known allergies. Family History  Problem Relation Age of Onset  . Heart disease Neg Hx   . Asthma Mother    Social History:   reports that he has been smoking Cigarettes.  He has a 40 pack-year smoking history. He has never used smokeless tobacco. He reports that he does not drink alcohol or use illicit drugs.   REVIEW OF SYSTEMS - PERTINENT POSITIVES ONLY: See old chart  Physical Exam:   Blood pressure 134/88, pulse 76, temperature 97.4 F (36.3 C), temperature source Temporal, resp. rate 16, height 6' 4" (1.93 m), weight 209 lb 3.2 oz (94.892 kg). Body mass index is 25.48 kg/(m^2).  Gen:  WDWN WM NAD  Neurological: Alert and oriented to person, place, and time. Motor and sensory function is grossly intact  Head: Normocephalic and atraumatic.  Eyes: Conjunctivae are normal. Pupils are equal, round, and reactive to light. No scleral icterus.  Neck: Normal range   of motion. Neck supple. No tracheal deviation or thyromegaly present.  Cardiovascular:  SR without murmurs or gallops.  No carotid bruits Respiratory: Effort normal.  No respiratory distress. No chest wall tenderness. Breath sounds normal.  No wheezes, rales or rhonchi.  Abdomen:  nontender GU: Musculoskeletal: Normal range of motion. Extremities are nontender. No cyanosis, edema or clubbing noted Lymphadenopathy: No cervical, preauricular, postauricular or axillary adenopathy is present Skin: Skin is warm and dry. No rash noted. No diaphoresis. No erythema. No pallor. Pscyh: Normal mood and affect. Behavior is normal. Judgment and thought content normal.   LABORATORY RESULTS: No results found for this or any previous visit (from the past 48 hour(s)).  RADIOLOGY  RESULTS: No results found.  Problem List: Patient Active Problem List   Diagnosis Date Noted  . Rectal bleeding 02/11/2013  . Hx of CABG 01/22/2013  . Insomnia 09/30/2012  . History of hepatitis B 09/09/2012  . Colon cancer 07/28/2012  . Thrombocytopenia 11/04/2011  . Chest pain 11/04/2011  . Chronic systolic heart failure 11/03/2011  . 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 ALCOHOL ABUSE 01/18/2009  . TOBACCO ABUSE 01/18/2009  . DEPRESSION 01/17/2009  . HEMORRHOIDS 01/17/2009    Assessment & Plan: Symptomatic hemorrhoids  EUA and hemorrhoidectomy    Matt B. Kveon Casanas, MD, FACS  [facility] [phone] beeper [phone]  02/26/2013 4:12 PM     

## 2013-03-01 ENCOUNTER — Telehealth (INDEPENDENT_AMBULATORY_CARE_PROVIDER_SITE_OTHER): Payer: Self-pay

## 2013-03-01 ENCOUNTER — Encounter (HOSPITAL_COMMUNITY): Payer: Self-pay | Admitting: Pharmacy Technician

## 2013-03-01 NOTE — Telephone Encounter (Signed)
Pt called in wanting to clarify what medications he is to stop prior to his surgery. Advised he needs to stop his asa. He said he was told not to take vitamin E. i agreed and told him not to take it.He will stop these medication as of 12/25 which is 5 days prior to his surgery.

## 2013-03-02 ENCOUNTER — Encounter (HOSPITAL_COMMUNITY): Payer: Self-pay

## 2013-03-02 ENCOUNTER — Encounter (HOSPITAL_COMMUNITY)
Admission: RE | Admit: 2013-03-02 | Discharge: 2013-03-02 | Disposition: A | Discharge status: Home / Self Care | Payer: Medicare Other | Source: Ambulatory Visit | Attending: Surgery | Admitting: Surgery

## 2013-03-02 ENCOUNTER — Ambulatory Visit (HOSPITAL_COMMUNITY)
Admission: RE | Admit: 2013-03-02 | Discharge: 2013-03-02 | Disposition: A | Discharge status: Home / Self Care | Payer: Medicare Other | Source: Ambulatory Visit | Attending: Surgery | Admitting: Surgery

## 2013-03-02 DIAGNOSIS — K644 Residual hemorrhoidal skin tags: Secondary | ICD-10-CM | POA: Insufficient documentation

## 2013-03-02 DIAGNOSIS — Z01812 Encounter for preprocedural laboratory examination: Secondary | ICD-10-CM | POA: Insufficient documentation

## 2013-03-02 DIAGNOSIS — F17200 Nicotine dependence, unspecified, uncomplicated: Secondary | ICD-10-CM | POA: Insufficient documentation

## 2013-03-02 DIAGNOSIS — I4891 Unspecified atrial fibrillation: Secondary | ICD-10-CM | POA: Insufficient documentation

## 2013-03-02 DIAGNOSIS — K648 Other hemorrhoids: Secondary | ICD-10-CM | POA: Insufficient documentation

## 2013-03-02 DIAGNOSIS — Z01818 Encounter for other preprocedural examination: Secondary | ICD-10-CM | POA: Insufficient documentation

## 2013-03-02 HISTORY — DX: Atherosclerotic heart disease of native coronary artery without angina pectoris: I25.10

## 2013-03-02 LAB — CBC
HCT: 43.3 % (ref 39.0–52.0)
Hemoglobin: 14.9 g/dL (ref 13.0–17.0)
RBC: 4.82 MIL/uL (ref 4.22–5.81)
RDW: 14.4 % (ref 11.5–15.5)
WBC: 6.4 10*3/uL (ref 4.0–10.5)

## 2013-03-02 LAB — BASIC METABOLIC PANEL
BUN: 15 mg/dL (ref 6–23)
Chloride: 99 mEq/L (ref 96–112)
GFR calc Af Amer: >90 mL/min (ref >90)
Glucose, Bld: 97 mg/dL (ref 70–99)
Potassium: 4.8 mEq/L (ref 3.5–5.1)

## 2013-03-02 NOTE — Progress Notes (Signed)
EKG / ECHO / office notes from [facility] on chart

## 2013-03-02 NOTE — Patient Instructions (Addendum)
[name]  03/02/2013                           YOUR PROCEDURE IS SCHEDULED ON: 03/09/13               PLEASE REPORT TO SHORT STAY CENTER AT :  1:15PM               CALL THIS NUMBER IF ANY PROBLEMS THE DAY OF SURGERY :               [phone]                      REMEMBER:   Do not eat food or drink liquids AFTER MIDNIGHT  May have clear liquids UNTIL 6 HOURS BEFORE SURGERY (9:45 AM)  Clear liquids include soda, tea, black coffee, apple or grape juice, broth.  Take these medicines the morning of surgery with A SIP OF WATER: CARDIZEM   Do not wear jewelry, make-up   Do not wear lotions, powders, or perfumes.   Do not shave legs or underarms 12 hrs. before surgery (men may shave face)  Do not bring valuables to the hospital.  Contacts, dentures or bridgework may not be worn into surgery.  Leave suitcase in the car. After surgery it may be brought to your room.  For patients admitted to the hospital more than one night, checkout time is 11:00                          The day of discharge.   Patients discharged the day of surgery will not be allowed to drive home                             If going home same day of surgery, must have someone stay with you first                           24 hrs at home and arrange for some one to drive you home from hospital.    Special Instructions:   Please read over the following fact sheets that you were given:                                 1. [facility] PREPARING FOR SURGERY SHEET                                                X_____________________________________________________________________        Failure to follow these instructions may result in cancellation of your surgery

## 2013-03-03 ENCOUNTER — Other Ambulatory Visit (INDEPENDENT_AMBULATORY_CARE_PROVIDER_SITE_OTHER): Payer: Self-pay | Admitting: Surgery

## 2013-03-08 ENCOUNTER — Telehealth (INDEPENDENT_AMBULATORY_CARE_PROVIDER_SITE_OTHER): Payer: Self-pay | Admitting: *Deleted

## 2013-03-08 NOTE — Telephone Encounter (Signed)
Patient called to ask about bowel prep prior to his surgery tomorrow.  Spoke to Dr. Daphine Deutscher who stated to have patient use Miralax bowel prep today then one enema tonight and another one tomorrow morning prior to going to hospital.  Patient given detailed instructions and states understanding at this time.  Patient has no further questions at this time.

## 2013-03-09 ENCOUNTER — Encounter (HOSPITAL_COMMUNITY): Payer: Self-pay | Admitting: *Deleted

## 2013-03-09 ENCOUNTER — Observation Stay (HOSPITAL_COMMUNITY)
Admission: RE | Admit: 2013-03-09 | Discharge: 2013-03-10 | Disposition: A | Discharge status: Home / Self Care | Payer: Medicare Other | Source: Ambulatory Visit | Attending: Surgery | Admitting: Surgery

## 2013-03-09 ENCOUNTER — Ambulatory Visit (HOSPITAL_COMMUNITY): Payer: Medicare Other | Admitting: *Deleted

## 2013-03-09 ENCOUNTER — Encounter (HOSPITAL_COMMUNITY): Payer: Medicare Other | Admitting: *Deleted

## 2013-03-09 ENCOUNTER — Encounter (HOSPITAL_COMMUNITY)
Admission: RE | Disposition: A | Discharge status: Home / Self Care | Payer: Self-pay | Source: Ambulatory Visit | Attending: Surgery

## 2013-03-09 DIAGNOSIS — I10 Essential (primary) hypertension: Secondary | ICD-10-CM | POA: Insufficient documentation

## 2013-03-09 DIAGNOSIS — Z8719 Personal history of other diseases of the digestive system: Secondary | ICD-10-CM

## 2013-03-09 DIAGNOSIS — K649 Unspecified hemorrhoids: Secondary | ICD-10-CM | POA: Diagnosis present

## 2013-03-09 DIAGNOSIS — Z951 Presence of aortocoronary bypass graft: Secondary | ICD-10-CM | POA: Insufficient documentation

## 2013-03-09 DIAGNOSIS — I2510 Atherosclerotic heart disease of native coronary artery without angina pectoris: Secondary | ICD-10-CM | POA: Insufficient documentation

## 2013-03-09 DIAGNOSIS — I5022 Chronic systolic (congestive) heart failure: Secondary | ICD-10-CM | POA: Insufficient documentation

## 2013-03-09 DIAGNOSIS — K648 Other hemorrhoids: Principal | ICD-10-CM | POA: Insufficient documentation

## 2013-03-09 DIAGNOSIS — Z85038 Personal history of other malignant neoplasm of large intestine: Secondary | ICD-10-CM | POA: Insufficient documentation

## 2013-03-09 DIAGNOSIS — Z7982 Long term (current) use of aspirin: Secondary | ICD-10-CM | POA: Insufficient documentation

## 2013-03-09 DIAGNOSIS — F17200 Nicotine dependence, unspecified, uncomplicated: Secondary | ICD-10-CM | POA: Insufficient documentation

## 2013-03-09 DIAGNOSIS — I4891 Unspecified atrial fibrillation: Secondary | ICD-10-CM | POA: Insufficient documentation

## 2013-03-09 DIAGNOSIS — Z79899 Other long term (current) drug therapy: Secondary | ICD-10-CM | POA: Insufficient documentation

## 2013-03-09 HISTORY — PX: HEMORRHOID SURGERY: SHX153

## 2013-03-09 LAB — CBC
HCT: 41.2 % (ref 39.0–52.0)
Hemoglobin: 13.8 g/dL (ref 13.0–17.0)
MCH: 30.1 pg (ref 26.0–34.0)
MCHC: 33.5 g/dL (ref 30.0–36.0)
Platelets: 154 10*3/uL (ref 150–400)
RBC: 4.59 MIL/uL (ref 4.22–5.81)
RDW: 14.1 % (ref 11.5–15.5)

## 2013-03-09 LAB — CREATININE, SERUM: GFR calc non Af Amer: 87 mL/min — ABNORMAL LOW (ref >90)

## 2013-03-09 SURGERY — HEMORRHOIDECTOMY
Anesthesia: General | Site: Anus

## 2013-03-09 MED ORDER — ONDANSETRON HCL 4 MG/2ML IJ SOLN: 4.0000 mg | Freq: Four times a day (QID) | INTRAMUSCULAR | Status: DC | PRN

## 2013-03-09 MED ORDER — LIDOCAINE HCL (CARDIAC) 20 MG/ML IV SOLN
INTRAVENOUS | Status: AC
  Filled 2013-03-09: qty 5

## 2013-03-09 MED ORDER — DILTIAZEM HCL ER COATED BEADS 120 MG PO CP24
120.0000 mg | ORAL_CAPSULE | Freq: Every day | ORAL | Status: DC
  Filled 2013-03-09: qty 1

## 2013-03-09 MED ORDER — BUPIVACAINE LIPOSOME 1.3 % IJ SUSP
20.0000 mL | Freq: Once | INTRAMUSCULAR | Status: AC
  Administered 2013-03-09: 20 mL
  Filled 2013-03-09: qty 20

## 2013-03-09 MED ORDER — PROPOFOL 10 MG/ML IV BOLUS
INTRAVENOUS | Status: AC
  Filled 2013-03-09: qty 20

## 2013-03-09 MED ORDER — ROCURONIUM BROMIDE 100 MG/10ML IV SOLN
INTRAVENOUS | Status: AC
  Filled 2013-03-09: qty 1

## 2013-03-09 MED ORDER — ONDANSETRON HCL 4 MG PO TABS: 4.0000 mg | ORAL_TABLET | Freq: Four times a day (QID) | ORAL | Status: DC | PRN

## 2013-03-09 MED ORDER — LIDOCAINE HCL (CARDIAC) 20 MG/ML IV SOLN
INTRAVENOUS | Status: DC | PRN
  Administered 2013-03-09: 100 mg via INTRAVENOUS

## 2013-03-09 MED ORDER — SACCHAROMYCES BOULARDII 250 MG PO CAPS
250.0000 mg | ORAL_CAPSULE | Freq: Two times a day (BID) | ORAL | Status: DC
  Administered 2013-03-09: 250 mg via ORAL
  Filled 2013-03-09 (×3): qty 1

## 2013-03-09 MED ORDER — PROMETHAZINE HCL 25 MG/ML IJ SOLN: 6.2500 mg | INTRAMUSCULAR | Status: DC | PRN

## 2013-03-09 MED ORDER — POVIDONE-IODINE 10 % EX OINT
TOPICAL_OINTMENT | CUTANEOUS | Status: AC
  Filled 2013-03-09: qty 28.35

## 2013-03-09 MED ORDER — DEXAMETHASONE SODIUM PHOSPHATE 10 MG/ML IJ SOLN
INTRAMUSCULAR | Status: AC
  Filled 2013-03-09: qty 1

## 2013-03-09 MED ORDER — LORAZEPAM 1 MG PO TABS
2.0000 mg | ORAL_TABLET | Freq: Every day | ORAL | Status: DC
  Administered 2013-03-09: 2 mg via ORAL
  Filled 2013-03-09: qty 2

## 2013-03-09 MED ORDER — POVIDONE-IODINE 10 % EX OINT
TOPICAL_OINTMENT | CUTANEOUS | Status: DC | PRN
  Administered 2013-03-09: 1 via TOPICAL

## 2013-03-09 MED ORDER — SODIUM CHLORIDE 0.9 % IJ SOLN
INTRAMUSCULAR | Status: AC
  Filled 2013-03-09: qty 50

## 2013-03-09 MED ORDER — HYDROMORPHONE HCL PF 1 MG/ML IJ SOLN
0.2500 mg | INTRAMUSCULAR | Status: DC | PRN
  Administered 2013-03-09: 0.25 mg via INTRAVENOUS

## 2013-03-09 MED ORDER — PROPOFOL 10 MG/ML IV BOLUS
INTRAVENOUS | Status: DC | PRN
  Administered 2013-03-09: 200 mg via INTRAVENOUS

## 2013-03-09 MED ORDER — HEPARIN SODIUM (PORCINE) 5000 UNIT/ML IJ SOLN
5000.0000 [IU] | Freq: Three times a day (TID) | INTRAMUSCULAR | Status: DC
  Administered 2013-03-09 – 2013-03-10 (×2): 5000 [IU] via SUBCUTANEOUS
  Filled 2013-03-09 (×5): qty 1

## 2013-03-09 MED ORDER — MORPHINE SULFATE 2 MG/ML IJ SOLN
1.0000 mg | INTRAMUSCULAR | Status: DC | PRN
  Administered 2013-03-09 – 2013-03-10 (×2): 1 mg via INTRAVENOUS
  Filled 2013-03-09 (×2): qty 1

## 2013-03-09 MED ORDER — DEXAMETHASONE SODIUM PHOSPHATE 10 MG/ML IJ SOLN
INTRAMUSCULAR | Status: DC | PRN
  Administered 2013-03-09: 10 mg via INTRAVENOUS

## 2013-03-09 MED ORDER — DIBUCAINE 1 % RE OINT
TOPICAL_OINTMENT | RECTAL | Status: AC
  Filled 2013-03-09: qty 28

## 2013-03-09 MED ORDER — DOCUSATE SODIUM 100 MG PO CAPS
100.0000 mg | ORAL_CAPSULE | Freq: Every day | ORAL | Status: DC
  Administered 2013-03-09: 100 mg via ORAL
  Filled 2013-03-09 (×2): qty 1

## 2013-03-09 MED ORDER — LACTATED RINGERS IV SOLN
INTRAVENOUS | Status: DC
  Administered 2013-03-09: 1000 mL via INTRAVENOUS

## 2013-03-09 MED ORDER — LACTATED RINGERS IV SOLN: INTRAVENOUS | Status: DC

## 2013-03-09 MED ORDER — FENTANYL CITRATE 0.05 MG/ML IJ SOLN
INTRAMUSCULAR | Status: AC
  Filled 2013-03-09: qty 5

## 2013-03-09 MED ORDER — HEPARIN SODIUM (PORCINE) 5000 UNIT/ML IJ SOLN
5000.0000 [IU] | Freq: Once | INTRAMUSCULAR | Status: AC
  Administered 2013-03-09: 5000 [IU] via SUBCUTANEOUS
  Filled 2013-03-09: qty 1

## 2013-03-09 MED ORDER — HYDROMORPHONE HCL PF 1 MG/ML IJ SOLN
INTRAMUSCULAR | Status: AC
  Filled 2013-03-09: qty 1

## 2013-03-09 MED ORDER — ONDANSETRON HCL 4 MG/2ML IJ SOLN
INTRAMUSCULAR | Status: DC | PRN
  Administered 2013-03-09: 4 mg via INTRAVENOUS

## 2013-03-09 MED ORDER — FLEET ENEMA 7-19 GM/118ML RE ENEM: 1.0000 | ENEMA | Freq: Once | RECTAL | Status: DC

## 2013-03-09 MED ORDER — CHLORHEXIDINE GLUCONATE 4 % EX LIQD: 1.0000 "application " | Freq: Once | CUTANEOUS | Status: DC

## 2013-03-09 MED ORDER — ONDANSETRON HCL 4 MG/2ML IJ SOLN
INTRAMUSCULAR | Status: AC
  Filled 2013-03-09: qty 2

## 2013-03-09 MED ORDER — KCL IN DEXTROSE-NACL 20-5-0.45 MEQ/L-%-% IV SOLN
INTRAVENOUS | Status: DC
  Administered 2013-03-09: via INTRAVENOUS
  Filled 2013-03-09 (×2): qty 1000

## 2013-03-09 MED ORDER — FENTANYL CITRATE 0.05 MG/ML IJ SOLN
INTRAMUSCULAR | Status: DC | PRN
  Administered 2013-03-09 (×2): 50 ug via INTRAVENOUS

## 2013-03-09 SURGICAL SUPPLY — 32 items
BLADE HEX COATED 2.75 (ELECTRODE) ×2 IMPLANT
BLADE SURG 15 STRL LF DISP TIS (BLADE) ×1 IMPLANT
BLADE SURG 15 STRL SS (BLADE) ×1
BRIEF STRETCH FOR OB PAD LRG (UNDERPADS AND DIAPERS) ×2 IMPLANT
CANISTER SUCTION 2500CC (MISCELLANEOUS) ×2 IMPLANT
DECANTER SPIKE VIAL GLASS SM (MISCELLANEOUS) ×2 IMPLANT
ELECT REM PT RETURN 9FT ADLT (ELECTROSURGICAL) ×2
ELECTRODE REM PT RTRN 9FT ADLT (ELECTROSURGICAL) ×1 IMPLANT
GAUZE SPONGE 4X4 16PLY XRAY LF (GAUZE/BANDAGES/DRESSINGS) ×2 IMPLANT
GLOVE BIOGEL M 8.0 STRL (GLOVE) ×2 IMPLANT
GLOVE BIOGEL PI IND STRL 7.0 (GLOVE) ×1 IMPLANT
GLOVE BIOGEL PI INDICATOR 7.0 (GLOVE) ×1
GOWN PREVENTION PLUS LG XLONG (DISPOSABLE) IMPLANT
GOWN STRL REIN XL XLG (GOWN DISPOSABLE) ×6 IMPLANT
KIT BASIN OR (CUSTOM PROCEDURE TRAY) ×2 IMPLANT
LUBRICANT JELLY K Y 4OZ (MISCELLANEOUS) ×2 IMPLANT
NDL SAFETY ECLIPSE 18X1.5 (NEEDLE) IMPLANT
NEEDLE HYPO 18GX1.5 SHARP (NEEDLE)
NEEDLE HYPO 25X1 1.5 SAFETY (NEEDLE) ×2 IMPLANT
NS IRRIG 1000ML POUR BTL (IV SOLUTION) ×2 IMPLANT
PACK LITHOTOMY IV (CUSTOM PROCEDURE TRAY) ×2 IMPLANT
PAD ABD 8X10 STRL (GAUZE/BANDAGES/DRESSINGS) IMPLANT
PENCIL BUTTON HOLSTER BLD 10FT (ELECTRODE) ×2 IMPLANT
SHEARS HARMONIC 9CM CVD (BLADE) ×2 IMPLANT
SPONGE GAUZE 4X4 12PLY (GAUZE/BANDAGES/DRESSINGS) ×2 IMPLANT
SPONGE SURGIFOAM ABS GEL 100 (HEMOSTASIS) IMPLANT
SPONGE SURGIFOAM ABS GEL 12-7 (HEMOSTASIS) IMPLANT
SUT CHROMIC 2 0 SH (SUTURE) ×6 IMPLANT
SUT CHROMIC 3 0 SH 27 (SUTURE) ×2 IMPLANT
SYR CONTROL 10ML LL (SYRINGE) ×2 IMPLANT
UNDERPAD 30X30 INCONTINENT (UNDERPADS AND DIAPERS) ×2 IMPLANT
YANKAUER SUCT BULB TIP 10FT TU (MISCELLANEOUS) ×2 IMPLANT

## 2013-03-09 NOTE — H&P (View-Only) (Signed)
Chief Complaint:  Bleeding hemorrhoids, recent colonoscopy  History of Present Illness:  [name] is an 67 y.o. male with him we've spoken about hemorrhoidectomy on a number of occasions in the past. He's had a recent colonoscopy which did not show any other cause for bleeding. His main problem is been his hemorrhoids come out bleed. He's been thinking about having them removed for sometime. I discussed exam under anesthesia with possible banding and/or excision of hemorrhoids. He was to go and get this scheduled so we will proceed.    Past Medical History  Diagnosis Date  . Atrial fibrillation     diagnosed 2010  . Angina   . Hepatitis     hepatitis B-never treated-Titers were low  . Hypertension   . 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 03/21/2011 - 2D Echo: EF 25% to 30%. Diffuse hypokinesis.  Marland Kitchen HEMORRHOIDS 01/17/2009  . DEPRESSION 01/17/2009  . ALCOHOL ABUSE 01/18/2009  . Heart murmur   . Insomnia   . Colon cancer   . Coronary disease     Past Surgical History  Procedure Laterality Date  . Mouth surgery    . Small intestine surgery    . Colon surgery    . Arterial bypass surgry    . Coronary artery bypass graft      triple  bypass 2013  . Cardiac catheterization    . Colonoscopy N/A 02/11/2013    Procedure: COLONOSCOPY;  Surgeon: Rachael Fee, MD;  Location: [facility];  Service: Endoscopy;  Laterality: N/A;    Current Outpatient Prescriptions  Medication Sig Dispense Refill  . aspirin 325 MG tablet Take 325 mg by mouth daily.      Marland Kitchen diltiazem (CARDIZEM CD) 120 MG 24 hr capsule Take 1 capsule (120 mg total) by mouth daily.  30 capsule  2  . docusate sodium (COLACE) 100 MG capsule Take 1 capsule (100 mg total) by mouth 2 (two) times daily.  60 capsule  3  . LORazepam (ATIVAN) 2 MG tablet Take 1 tablet (2 mg total) by mouth at bedtime.  30 tablet  0  . Multiple Vitamin (MULTIVITAMIN) capsule Take 1 capsule by mouth daily.      . polyethylene glycol  powder (GLYCOLAX/MIRALAX) powder Take 17 g by mouth 4 (four) times daily.  3350 g  3  . Zinc Sulfate (ZINC 15 PO) Take 15 mg by mouth every other day.       No current facility-administered medications for this visit.   Review of patient's allergies indicates no known allergies. Family History  Problem Relation Age of Onset  . Heart disease Neg Hx   . Asthma Mother    Social History:   reports that he has been smoking Cigarettes.  He has a 40 pack-year smoking history. He has never used smokeless tobacco. He reports that he does not drink alcohol or use illicit drugs.   REVIEW OF SYSTEMS - PERTINENT POSITIVES ONLY: See old chart  Physical Exam:   Blood pressure 134/88, pulse 76, temperature 97.4 F (36.3 C), temperature source Temporal, resp. rate 16, height 6\' 4"  (1.93 m), weight 209 lb 3.2 oz (94.892 kg). Body mass index is 25.48 kg/(m^2).  Gen:  WDWN WM NAD  Neurological: Alert and oriented to person, place, and time. Motor and sensory function is grossly intact  Head: Normocephalic and atraumatic.  Eyes: Conjunctivae are normal. Pupils are equal, round, and reactive to light. No scleral icterus.  Neck: Normal range  of motion. Neck supple. No tracheal deviation or thyromegaly present.  Cardiovascular:  SR without murmurs or gallops.  No carotid bruits Respiratory: Effort normal.  No respiratory distress. No chest wall tenderness. Breath sounds normal.  No wheezes, rales or rhonchi.  Abdomen:  nontender GU: Musculoskeletal: Normal range of motion. Extremities are nontender. No cyanosis, edema or clubbing noted Lymphadenopathy: No cervical, preauricular, postauricular or axillary adenopathy is present Skin: Skin is warm and dry. No rash noted. No diaphoresis. No erythema. No pallor. Pscyh: Normal mood and affect. Behavior is normal. Judgment and thought content normal.   LABORATORY RESULTS: No results found for this or any previous visit (from the past 48 hour(s)).  RADIOLOGY  RESULTS: No results found.  Problem List: Patient Active Problem List   Diagnosis Date Noted  . Rectal bleeding 02/11/2013  . Hx of CABG 01/22/2013  . Insomnia 09/30/2012  . History of hepatitis B 09/09/2012  . Colon cancer 07/28/2012  . Thrombocytopenia 11/04/2011  . Chest pain 11/04/2011  . Chronic systolic heart failure 11/03/2011  . 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 ALCOHOL ABUSE 01/18/2009  . TOBACCO ABUSE 01/18/2009  . DEPRESSION 01/17/2009  . HEMORRHOIDS 01/17/2009    Assessment & Plan: Symptomatic hemorrhoids  EUA and hemorrhoidectomy    Matt B. Daphine Deutscher, MD, [facility] [phone] beeper [phone]  02/26/2013 4:12 PM

## 2013-03-09 NOTE — Interval H&P Note (Signed)
History and Physical Interval Note:  03/09/2013 1:47 PM  [name]  has presented today for surgery, with the diagnosis of internal and external hemorrhoids  The various methods of treatment have been discussed with the patient and family. After consideration of risks, benefits and other options for treatment, the patient has consented to  Procedure(s): HEMORRHOIDECTOMY (N/A) as a surgical intervention .  The patient's history has been reviewed, patient examined, no change in status, stable for surgery.  I have reviewed the patient's chart and labs.  Questions were answered to the patient's satisfaction.     Keenon Leitzel B

## 2013-03-09 NOTE — Anesthesia Preprocedure Evaluation (Addendum)
Anesthesia Evaluation  Patient identified by MRN, date of birth, ID band Patient awake    Reviewed: Allergy & Precautions, H&P , NPO status , Patient's Chart, lab work & pertinent test results  Airway Mallampati: III TM Distance: >3 FB Neck ROM: Full    Dental  (+) Edentulous Upper, Edentulous Lower and Dental Advisory Given   Pulmonary neg pulmonary ROS, Current Smoker,    Pulmonary exam normal       Cardiovascular + CAD and + CABG + dysrhythmias Atrial Fibrillation Rhythm:Irregular Rate:Normal  Dilated cardiomyopathy with EF 25-30%   Neuro/Psych Depression negative neurological ROS     GI/Hepatic (+)     substance abuse  alcohol use, Hepatitis -, BHx of Colon Cancer   Endo/Other  negative endocrine ROS  Renal/GU negative Renal ROS  negative genitourinary   Musculoskeletal negative musculoskeletal ROS (+)   Abdominal   Peds negative pediatric ROS (+)  Hematology negative hematology ROS (+)   Anesthesia Other Findings   Reproductive/Obstetrics                          Anesthesia Physical Anesthesia Plan  ASA: III  Anesthesia Plan: General   Post-op Pain Management:    Induction: Intravenous  Airway Management Planned: LMA  Additional Equipment:   Intra-op Plan:   Post-operative Plan: Extubation in OR  Informed Consent: I have reviewed the patients History and Physical, chart, labs and discussed the procedure including the risks, benefits and alternatives for the proposed anesthesia with the patient or authorized representative who has indicated his/her understanding and acceptance.   Dental advisory given  Plan Discussed with: CRNA  Anesthesia Plan Comments:         Anesthesia Quick Evaluation

## 2013-03-09 NOTE — Transfer of Care (Signed)
Immediate Anesthesia Transfer of Care Note  Patient: [name]  Procedure(s) Performed: Procedure(s): HEMORRHOIDECTOMY (N/A)  Patient Location: PACU  Anesthesia Type:General  Level of Consciousness: sedated  Airway & Oxygen Therapy: Patient Spontanous Breathing and Patient connected to face mask oxygen  Post-op Assessment: Report given to PACU RN and Post -op Vital signs reviewed and stable  Post vital signs: Reviewed and stable  Complications: No apparent anesthesia complications

## 2013-03-09 NOTE — Op Note (Signed)
Surgeon: Wenda Low, MD, FACS  Asst:  none  Anes:  general  Procedure: 3 quadrant hemorrhoidectomy with largest column on the left lateral position  Diagnosis: Third degree hemorrhoids especially on the left  Complications: none  EBL:   10 cc  Description of Procedure:  After general anesthesia, the patient was placed in the dorsal lithotomy position and prepped and draped.  A timeout was performed.  EUA perform which revealed a large prolapsing, multilayered hemorrhoid complex at the 4 oclock position.  This was clamped and excised with the harmonic scalpel.  It was oversewn with a horizontal mattress of 3-0 chromic and then sewn back to the apex with a running locking suture.  An internal complex at 8 oclock and 12 oclock were similarly excised but without the skin.  The skin that was excised with the 4 oclock hemorrhoid was closed with interupted 3-0 chromic.  Betadine was massaged into the anus and the area was injected with Exparel diluted to 30 cc total.  Bleeding controlled.    Matt B. Daphine Deutscher, MD, [facility], [place] [phone]

## 2013-03-10 ENCOUNTER — Encounter (HOSPITAL_COMMUNITY): Payer: Self-pay | Admitting: Surgery

## 2013-03-10 DIAGNOSIS — Z8719 Personal history of other diseases of the digestive system: Secondary | ICD-10-CM

## 2013-03-10 LAB — CBC
HCT: 41.3 % (ref 39.0–52.0)
MCH: 31.1 pg (ref 26.0–34.0)
MCV: 89.8 fL (ref 78.0–100.0)
Platelets: 166 10*3/uL (ref 150–400)
RBC: 4.6 MIL/uL (ref 4.22–5.81)
WBC: 13 10*3/uL — ABNORMAL HIGH (ref 4.0–10.5)

## 2013-03-10 LAB — BASIC METABOLIC PANEL
CO2: 23 mEq/L (ref 19–32)
Calcium: 8.9 mg/dL (ref 8.4–10.5)
Chloride: 100 mEq/L (ref 96–112)
Creatinine, Ser: 0.82 mg/dL (ref 0.50–1.35)
GFR calc Af Amer: >90 mL/min (ref >90)
GFR calc non Af Amer: >90 mL/min (ref >90)
Glucose, Bld: 153 mg/dL — ABNORMAL HIGH (ref 70–99)
Potassium: 4.5 mEq/L (ref 3.7–5.3)

## 2013-03-10 MED ORDER — HYDROCODONE-ACETAMINOPHEN 5-325 MG PO TABS: 1.0000 | ORAL_TABLET | ORAL | Status: DC | PRN

## 2013-03-10 MED ORDER — LORAZEPAM 2 MG PO TABS: 2.0000 mg | ORAL_TABLET | Freq: Every day | ORAL | Status: DC

## 2013-03-10 NOTE — Discharge Summary (Signed)
Physician Discharge Summary  Patient ID: DAMEIR GENTZLER MRN: [id number] DOB/AGE: [birth year] 66 y.o.  Admit date: 03/09/2013 Discharge date: 03/10/2013  Admission Diagnoses:  hemorrhoids  Discharge Diagnoses:  same  Active Problems:   Status post hemorrhoidectomy Dec 2014   Surgery:  hemorrhoidectomy  Discharged Condition: Improved.    Hospital Course:   Had open three column hemorrhoidectomy.  Postop Exparel used.    Consults: none  Significant Diagnostic Studies: none    Discharge Exam: Blood pressure 129/74, pulse 65, temperature 97.3 F (36.3 C), temperature source Oral, resp. rate 18, height 6\' 4"  (1.93 m), weight 210 lb 8 oz (95.482 kg), SpO2 92.00%. No bleeding.  Pain controlled.  Ready for discharge.   Disposition: 01-Home or Self Care  Discharge Orders   Future Orders Complete By Expires   Diet - low sodium heart healthy  As directed    Discharge instructions  As directed    Comments:     Sit in a tub of warm water at least twice a day and after bowel movements   Increase activity slowly  As directed        Medication List    STOP taking these medications       polyethylene glycol powder powder  Commonly known as:  GLYCOLAX/MIRALAX      TAKE these medications       aspirin 325 MG tablet  Take 325 mg by mouth daily.     diltiazem 120 MG 24 hr capsule  Commonly known as:  CARDIZEM CD  Take 120 mg by mouth daily with breakfast.     docusate sodium 100 MG capsule  Commonly known as:  COLACE  Take 100 mg by mouth.     HYDROcodone-acetaminophen 5-325 MG per tablet  Commonly known as:  NORCO/VICODIN  Take 1 tablet by mouth every 4 (four) hours as needed.     LORazepam 2 MG tablet  Commonly known as:  ATIVAN  Take 1 tablet (2 mg total) by mouth at bedtime.     multivitamin capsule  Take 1 capsule by mouth daily.           Follow-up Information   Follow up with Valarie Merino, MD.   Specialty:  General Surgery   Contact information:    [address] [place] [address] [phone]       Signed: Valarie Merino 03/10/2013, 8:11 AM

## 2013-03-11 NOTE — Anesthesia Postprocedure Evaluation (Signed)
Anesthesia Post Note  Patient: [name]  Procedure(s) Performed: Procedure(s) (LRB): HEMORRHOIDECTOMY (N/A)  Anesthesia type: General  Patient location: PACU  Post pain: Pain level controlled  Post assessment: Post-op Vital signs reviewed  Last Vitals:  Filed Vitals:   03/10/13 0600  BP: 129/74  Pulse: 65  Temp: 36.3 C  Resp: 18    Post vital signs: Reviewed  Level of consciousness: sedated  Complications: No apparent anesthesia complications

## 2013-03-17 ENCOUNTER — Telehealth (INDEPENDENT_AMBULATORY_CARE_PROVIDER_SITE_OTHER): Payer: Self-pay

## 2013-03-17 NOTE — Telephone Encounter (Signed)
Called pt to make f/u appt with Dr Hassell Done b/c a message was left in [place] in basket that pt needed to be seen before 2/4. I made the pt an appt with Dr Hassell Done for 04/01/13. The pt stated that he was having issues with straining again after surgery that the pt has not had a BM in day and half. I asked the pt what he was doing to prevent constipation and he said sitting in a warm sitz bath along with taking Colace TID. The pt stated that he has some Mirilax at home but wasn't taking it since the surgery. I advised pt that it would be ok for him to restart the Mirilax now along with drinking plenty of liquids to help with the Colace. I advised pt that soaking in a warm sitz bath helps with swelling. I advised pt that after starting the Mirilax if he gets no relief the pt needs to call us back to let us know so we can notify Dr Hassell Done. The pt stated that when he got in the sitz bath this am he can feel what he thinks is another hemorrhoid but not sure that it might just be swelling after the hemorrhoidectomy. The pt understands to call us back tomorrow if worse.

## 2013-03-19 ENCOUNTER — Telehealth (INDEPENDENT_AMBULATORY_CARE_PROVIDER_SITE_OTHER): Payer: Self-pay | Admitting: *Deleted

## 2013-03-19 ENCOUNTER — Other Ambulatory Visit (INDEPENDENT_AMBULATORY_CARE_PROVIDER_SITE_OTHER): Payer: Self-pay | Admitting: *Deleted

## 2013-03-19 ENCOUNTER — Encounter (INDEPENDENT_AMBULATORY_CARE_PROVIDER_SITE_OTHER): Payer: Medicare Other | Admitting: Surgery

## 2013-03-19 MED ORDER — HYDROCODONE-ACETAMINOPHEN 5-325 MG PO TABS: 1.0000 | ORAL_TABLET | Freq: Four times a day (QID) | ORAL | Status: DC | PRN

## 2013-03-19 NOTE — Telephone Encounter (Signed)
Per Dr. Hassell Done patient need's to continue with sitz baths and home instructions given at time of discharge.

## 2013-03-19 NOTE — Telephone Encounter (Signed)
Patient called asking for a refill of his Norco.  Patient had a hemorrhoidectomy on 03/09/13.  Patient has not had a refill since his surgery.  Patient states he is following all instructions with the soaks and taking Miralax and Probiotics.  Patient reports increased pain with BMs still and needing the Norco refill for those times.  Explained that I will get a message to Dr. Hassell Done in office today to ask about this refill then we will let him know.  Patient states understanding and agreeable at this time.

## 2013-03-19 NOTE — Telephone Encounter (Signed)
Called patient to let him know that Dr. Hassell Done did approve a refill of the Norco 5/325mg  Take 1 tablet every 6 hours as needed for pain #30 No refills  Patient states understanding and agreeable with POC

## 2013-04-01 ENCOUNTER — Encounter (INDEPENDENT_AMBULATORY_CARE_PROVIDER_SITE_OTHER): Payer: Self-pay

## 2013-04-01 ENCOUNTER — Encounter (INDEPENDENT_AMBULATORY_CARE_PROVIDER_SITE_OTHER): Payer: Self-pay | Admitting: Surgery

## 2013-04-01 ENCOUNTER — Ambulatory Visit (INDEPENDENT_AMBULATORY_CARE_PROVIDER_SITE_OTHER): Payer: Medicare Other | Admitting: Surgery

## 2013-04-01 ENCOUNTER — Other Ambulatory Visit: Payer: Self-pay | Admitting: Internal Medicine

## 2013-04-01 VITALS — BP 118/76 | HR 68 | Temp 97.2°F | Resp 18 | Ht 76.0 in | Wt 202.4 lb

## 2013-04-01 DIAGNOSIS — Z9889 Other specified postprocedural states: Principal | ICD-10-CM

## 2013-04-01 DIAGNOSIS — Z8719 Personal history of other diseases of the digestive system: Secondary | ICD-10-CM

## 2013-04-01 MED ORDER — DILTIAZEM HCL ER COATED BEADS 120 MG PO CP24: 120.0000 mg | ORAL_CAPSULE | Freq: Every day | ORAL | Status: DC

## 2013-04-01 NOTE — Patient Instructions (Signed)
Thanks for your patience.  If you need further assistance after leaving the office, please call our office and speak with a [facility] nurse.  [phone].  If you want to leave a message for Dr. Raynelle Fujikawa, please call his office phone at [phone]. 

## 2013-04-01 NOTE — Progress Notes (Signed)
[name] 68 y.o.  Body mass index is 24.65 kg/(m^2).  Patient Active Problem List   Diagnosis Date Noted  . Status post hemorrhoidectomy Dec 2014 03/10/2013  . Hx of CABG 01/22/2013  . Insomnia 09/30/2012  . History of hepatitis B 09/09/2012  . Colon cancer 07/28/2012  . Thrombocytopenia 11/04/2011  . Chest pain 11/04/2011  . Chronic systolic heart failure 54/65/6812  . 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 ALCOHOL ABUSE 01/18/2009  . TOBACCO ABUSE 01/18/2009  . DEPRESSION 01/17/2009    No Known Allergies  Past Surgical History  Procedure Laterality Date  . Mouth surgery    . Cardiac catheterization    . Colonoscopy N/A 02/11/2013    Procedure: COLONOSCOPY;  Surgeon: Milus Banister, MD;  Location: [facility];  Service: Endoscopy;  Laterality: N/A;  . Colon surgery  07/2012    COLON CANCER  . Coronary artery bypass graft      triple  bypass OCT 2013 in [place]  . Hemorrhoid surgery N/A 03/09/2013    Procedure: HEMORRHOIDECTOMY;  Surgeon: Pedro Earls, MD;  Location: [facility];  Service: General;  Laterality: N/A;   Leisa Lenz, MD No diagnosis found.  Doing well after hemorrhoidectomy.  No more bleeding.  There was a large bleeding complex on the side that was the main culprit and I excised.   Return prn.  Matt B. Hassell Done, MD, [facility] [phone] beeper [phone]  04/01/2013 1:10 PM

## 2013-04-15 ENCOUNTER — Ambulatory Visit: Payer: Medicare Other | Attending: Internal Medicine | Admitting: Family Medicine

## 2013-04-15 ENCOUNTER — Encounter: Payer: Self-pay | Admitting: Family Medicine

## 2013-04-15 VITALS — BP 121/83 | HR 74 | Temp 98.6°F | Resp 14 | Ht 76.0 in | Wt 213.0 lb

## 2013-04-15 DIAGNOSIS — M25559 Pain in unspecified hip: Secondary | ICD-10-CM | POA: Insufficient documentation

## 2013-04-15 DIAGNOSIS — M25551 Pain in right hip: Secondary | ICD-10-CM

## 2013-04-15 DIAGNOSIS — F411 Generalized anxiety disorder: Secondary | ICD-10-CM | POA: Insufficient documentation

## 2013-04-15 MED ORDER — LORAZEPAM 2 MG PO TABS: 2.0000 mg | ORAL_TABLET | Freq: Every day | ORAL | Status: DC

## 2013-04-15 MED ORDER — MELOXICAM 7.5 MG PO TABS: 7.5000 mg | ORAL_TABLET | Freq: Every day | ORAL | Status: DC

## 2013-04-15 NOTE — Progress Notes (Signed)
   Subjective:    Patient ID: [name], male    DOB: May 12, 1945, 68 y.o.   MRN: [id number]  HPI Patient is here today in followup.  He requests a refill on his Ativan that he has taken since last year. He denies current feelings of anxiety but feels that the Ativan does help with this he also denies homicidal or suicidal ideations. He does not currently see behavioral health.  He complains of right hip pain. It hurts at night and wakes him up. 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 He has pain when he crosses the right leg in front of the left and feels that there is a stretching over the right hip. He denies any groin pain. He denies any pain radiation down his leg. He denies any sciatica notch pain.   Review of Systems A 12 point review of systems is negative except as per hpi.       Objective:   Physical Exam  Nursing note and vitals reviewed. Constitutional: He is oriented to person, place, and time. He  appears well-developed and well-nourished.  HENT:  Right Ear: External ear normal.  Left Ear: External ear normal.  Nose: Nose normal.  Mouth/Throat: Oropharynx is clear and moist. No oropharyngeal exudate.  Eyes: Conjunctivae are normal. Pupils are equal, round, and reactive to light.  Neck: Normal range of motion. Neck supple. No thyromegaly present.  Cardiovascular: Normal rate, regular rhythm and normal heart sounds.   Pulmonary/Chest: Effort normal and breath sounds normal.  Abdominal: Soft. Bowel sounds are normal.  no distension. There is no tenderness. There is no rebound.  Lymphadenopathy:    He has no cervical adenopathy.  Neurological: He is alert and oriented to person, place, and time. He has normal reflexes.  Skin: Skin is warm and dry.He has no concerning moles or skin lesions Psychiatric: He has a normal mood and affect. His behavior is normal. msk - RLE: mild ttp over greater trochanter and upper 1/2 IT band area. Neg SLR. neurovasc intact.        Assessment & Plan:  [name] was seen today for office visit.  Diagnoses and associated orders for this visit:  Anxiety state, unspecified - LORazepam (ATIVAN) 2 MG tablet; Take 1 tablet (2 mg total) by mouth at bedtime. - Ambulatory referral to Psychiatry Explained to this patient very clearly that there will be absolutely no refills on Ativan. I have referred him to behavioral health to discuss continuing taking Ativan versus weaning off. I've explained to him that his choices are to either followup with behavioral health and discuss Ativan with them or to use the one refill I've given him today to wean himself off over the next few weeks. I've explained to him that it is dangerous to stop without weaning down however if he chooses not to see behavioral health in chooses not to wean himself off we will still not refill Ativan. Patient verbalizes understanding. Hip pain, right - meloxicam (MOBIC) 7.5 MG tablet; Take 1 tablet (7.5 mg total) by mouth daily. - DG Hip Complete Right; Future I suspect this is musculoskeletal in origin. I've given him some exercises to do and we'll discuss at his followup appointment. I have gotten an x-ray today to see if there is any arthritic component.  We'll plan to see this gentleman back in about 3 months. Earlier if needed.

## 2013-04-15 NOTE — Progress Notes (Signed)
Pt is here for an office visit. Requests a medication refill. Complains of Rt hip pain x3 days. No OTC medications taken. Pain scale of 3 today. Pain worsens while trying to lay down. Pain runs from the hip to the knee.

## 2013-04-15 NOTE — Patient Instructions (Addendum)
Lorazepam tablets What is this medicine? LORAZEPAM (lor A ze pam) is a benzodiazepine. It is used to treat anxiety. This medicine may be used for other purposes; ask your health care provider or pharmacist if you have questions. COMMON BRAND NAME(S): Ativan What should I tell my health care provider before I take this medicine? They need to know if you have any of these conditions: -alcohol or drug abuse problem -bipolar disorder, depression, psychosis or other mental health condition -glaucoma -kidney or liver disease -lung disease or breathing difficulties -myasthenia gravis -Parkinson's disease -seizures or a history of seizures -suicidal thoughts -an unusual or allergic reaction to lorazepam, other benzodiazepines, foods, dyes, or preservatives -pregnant or trying to get pregnant -breast-feeding How should I use this medicine? Take this medicine by mouth with a glass of water. Follow the directions on the prescription label. If it upsets your stomach, take it with food or milk. Take your medicine at regular intervals. Do not take it more often than directed. Do not stop taking except on the advice of your doctor or health care professional. Talk to your pediatrician regarding the use of this medicine in children. Special care may be needed. Overdosage: If you think you have taken too much of this medicine contact a poison control center or emergency room at once. NOTE: This medicine is only for you. Do not share this medicine with others. What if I miss a dose? If you miss a dose, take it as soon as you can. If it is almost time for your next dose, take only that dose. Do not take double or extra doses. What may interact with this medicine? -barbiturate medicines for inducing sleep or treating seizures, like phenobarbital -clozapine -medicines for depression, mental problems or psychiatric disturbances -medicines for sleep -phenytoin -probenecid -theophylline -valproic  acid This list may not describe all possible interactions. Give your health care provider a list of all the medicines, herbs, non-prescription drugs, or dietary supplements you use. Also tell them if you smoke, drink alcohol, or use illegal drugs. Some items may interact with your medicine. What should I watch for while using this medicine? Visit your doctor or health care professional for regular checks on your progress. Your body may become dependent on this medicine, ask your doctor or health care professional if you still need to take it. However, if you have been taking this medicine regularly for some time, do not suddenly stop taking it. You must gradually reduce the dose or you may get severe side effects. Ask your doctor or health care professional for advice before increasing or decreasing the dose. Even after you stop taking this medicine it can still affect your body for several days. You may get drowsy or dizzy. Do not drive, use machinery, or do anything that needs mental alertness until you know how this medicine affects you. To reduce the risk of dizzy and fainting spells, do not stand or sit up quickly, especially if you are an older patient. Alcohol may increase dizziness and drowsiness. Avoid alcoholic drinks. Do not treat yourself for coughs, colds or allergies without asking your doctor or health care professional for advice. Some ingredients can increase possible side effects. What side effects may I notice from receiving this medicine? Side effects that you should report to your doctor or health care professional as soon as possible: -changes in vision -confusion -depression -mood changes, excitability or aggressive behavior -movement difficulty, staggering or jerky movements -muscle cramps -restlessness -weakness or tiredness Side effects that  usually do not require medical attention (report to your doctor or health care professional if they continue or are  bothersome): -constipation or diarrhea -difficulty sleeping, nightmares -dizziness, drowsiness -headache -nausea, vomiting This list may not describe all possible side effects. Call your doctor for medical advice about side effects. You may report side effects to FDA at 1-800-FDA-1088. Where should I keep my medicine? Keep out of the reach of children. This medicine can be abused. Keep your medicine in a safe place to protect it from theft. Do not share this medicine with anyone. Selling or giving away this medicine is dangerous and against the law. Store at room temperature between 20 and 25 degrees C (68 and 77 degrees F). Protect from light. Keep container tightly closed. Throw away any unused medicine after the expiration date. NOTE: This sheet is a summary. It may not cover all possible information. If you have questions about this medicine, talk to your doctor, pharmacist, or health care provider.  2014, [employer]. (2007-08-28 14:58:20) Hip Exercises RANGE OF MOTION (ROM) AND STRETCHING EXERCISES  These exercises may help you when beginning to rehabilitate your injury. Doing them too aggressively can worsen your condition. Complete them slowly and gently. Your symptoms may resolve with or without further involvement from your physician, physical therapist or athletic trainer. While completing these exercises, remember:   Restoring tissue flexibility helps normal motion to return to the joints. This allows healthier, less painful movement and activity.  An effective stretch should be held for at least 30 seconds.  A stretch should never be painful. You should only feel a gentle lengthening or release in the stretched tissue. If these stretches worsen your symptoms even when done gently, consult your physician, physical therapist or athletic trainer. STRETCH Hamstrings, Supine   Lie on your back. Loop a belt or towel over the ball of your right / left foot.  Straighten your  right / left knee and slowly pull on the belt to raise your leg. Do not allow the right / left knee to bend. Keep your opposite leg flat on the floor.  Raise the leg until you feel a gentle stretch behind your right / left knee or thigh. Hold this position for __________ seconds. Repeat __________ times. Complete this stretch __________ times per day.  STRETCH - Hip Rotators   Lie on your back on a firm surface. Grasp your right / left knee with your right / left hand and your ankle with your opposite hand.  Keeping your hips and shoulders firmly planted, gently pull your right / left knee and rotate your lower leg toward your opposite shoulder until you feel a stretch in your buttocks.  Hold this stretch for __________ seconds. Repeat this stretch __________ times. Complete this stretch __________ times per day. STRETCH - Hamstrings/Adductors, V-Sit   Sit on the floor with your legs extended in a large "V," keeping your knees straight.  With your head and chest upright, bend at your waist reaching for your right foot to stretch your left adductors.  You should feel a stretch in your left inner thigh. Hold for __________ seconds.  Return to the upright position to relax your leg muscles.  Continuing to keep your chest upright, bend straight forward at your waist to stretch your hamstrings.  You should feel a stretch behind both of your thighs and/or knees. Hold for __________ seconds.  Return to the upright position to relax your leg muscles.  Repeat steps 2 through 4 for  opposite leg. Repeat __________ times. Complete this exercise __________ times per day.  STRETCHING - Hip Flexors, Lunge  Half kneel with your right / left knee on the floor and your opposite knee bent and directly over your ankle.  Keep good posture with your head over your shoulders. Tighten your buttocks to point your tailbone downward; this will prevent your back from arching too much.  You should feel a  gentle stretch in the front of your thigh and/or hip. If you do not feel any resistance, slightly slide your opposite foot forward and then slowly lunge forward so your knee once again lines up over your ankle. Be sure your tailbone remains pointed downward.  Hold this stretch for __________ seconds. Repeat __________ times. Complete this stretch __________ times per day. STRENGTHENING EXERCISES These exercises may help you when beginning to rehabilitate your injury. They may resolve your symptoms with or without further involvement from your physician, physical therapist or athletic trainer. While completing these exercises, remember:   Muscles can gain both the endurance and the strength needed for everyday activities through controlled exercises.  Complete these exercises as instructed by your physician, physical therapist or athletic trainer. Progress the resistance and repetitions only as guided.  You may experience muscle soreness or fatigue, but the pain or discomfort you are trying to eliminate should never worsen during these exercises. If this pain does worsen, stop and make certain you are following the directions exactly. If the pain is still present after adjustments, discontinue the exercise until you can discuss the trouble with your clinician. STRENGTH - Hip Extensors, Bridge   Lie on your back on a firm surface. Bend your knees and place your feet flat on the floor.  Tighten your buttocks muscles and lift your bottom off the floor until your trunk is level with your thighs. You should feel the muscles in your buttocks and back of your thighs working. If you do not feel these muscles, slide your feet 1-2 inches further away from your buttocks.  Hold this position for __________ seconds.  Slowly lower your hips to the starting position and allow your buttock muscles relax completely before beginning the next repetition.  If this exercise is too easy, you may cross your arms over  your chest. Repeat __________ times. Complete this exercise __________ times per day.  STRENGTH - Hip Abductors, Straight Leg Raises  Be aware of your form throughout the entire exercise so that you exercise the correct muscles. Sloppy form means that you are not strengthening the correct muscles.  Lie on your side so that your head, shoulders, knee and hip line up. You may bend your lower knee to help maintain your balance. Your right / left leg should be on top.  Roll your hips slightly forward, so that your hips are stacked directly over each other and your right / left knee is facing forward.  Lift your top leg up 4-6 inches, leading with your heel. Be sure that your foot does not drift forward or that your knee does not roll toward the ceiling.  Hold this position for __________ seconds. You should feel the muscles in your outer hip lifting (you may not notice this until your leg begins to tire).  Slowly lower your leg to the starting position. Allow the muscles to fully relax before beginning the next repetition. Repeat __________ times. Complete this exercise __________ times per day.  STRENGTH - Hip Adductors, Straight Leg Raises   Lie on your side  so that your head, shoulders, knee and hip line up. You may place your upper foot in front to help maintain your balance. Your right / left leg should be on the bottom.  Roll your hips slightly forward, so that your hips are stacked directly over each other and your right / left knee is facing forward.  Tense the muscles in your inner thigh and lift your bottom leg 4-6 inches. Hold this position for __________ seconds.  Slowly lower your leg to the starting position. Allow the muscles to fully relax before beginning the next repetition. Repeat __________ times. Complete this exercise __________ times per day.  STRENGTH - Quadriceps, Straight Leg Raises  Quality counts! Watch for signs that the quadriceps muscle is working to insure you  are strengthening the correct muscles and not "cheating" by substituting with healthier muscles.  Lay on your back with your right / left leg extended and your opposite knee bent.  Tense the muscles in the front of your right / left thigh. You should see either your knee cap slide up or increased dimpling just above the knee. Your thigh may even quiver.  Tighten these muscles even more and raise your leg 4 to 6 inches off the floor. Hold for right / left seconds.  Keeping these muscles tense, lower your leg.  Relax the muscles slowly and completely in between each repetition. Repeat __________ times. Complete this exercise __________ times per day.  STRENGTH - Hip Abductors, Standing  Tie one end of a rubber exercise band/tubing to a secure surface (table, pole) and tie a loop at the other end.  Place the loop around your right / left ankle. Keeping your ankle with the band directly opposite of the secured end, step away until there is tension in the tube/band.  Hold onto a chair as needed for balance.  Keeping your back upright, your shoulders over your hips, and your toes pointing forward, lift your right / left leg out to your side. Be sure to lift your leg with your hip muscles. Do not "throw" your leg or tip your body to lift your leg.  Slowly and with control, return to the starting position. Repeat exercise __________ times. Complete this exercise __________ times per day.  STRENGTH  Quadriceps, Squats  Stand in a door frame so that your feet and knees are in line with the frame.  Use your hands for balance, not support, on the frame.  Slowly lower your weight, bending at the hips and knees. Keep your lower legs upright so that they are parallel with the door frame. Squat only within the range that does not increase your knee pain. Never let your hips drop below your knees.  Slowly return upright, pushing with your legs, not pulling with your hands. Document Released:  03/15/2005 Document Revised: 05/20/2011 Document Reviewed: 06/09/2008 [facility] Patient Information 2014 [place], [place].

## 2013-04-16 ENCOUNTER — Telehealth: Payer: Self-pay | Admitting: Internal Medicine

## 2013-04-16 ENCOUNTER — Ambulatory Visit (HOSPITAL_COMMUNITY)
Admission: RE | Admit: 2013-04-16 | Discharge: 2013-04-16 | Disposition: A | Discharge status: Home / Self Care | Payer: Medicare Other | Source: Ambulatory Visit | Attending: Family Medicine | Admitting: Family Medicine

## 2013-04-16 DIAGNOSIS — G8929 Other chronic pain: Secondary | ICD-10-CM | POA: Insufficient documentation

## 2013-04-16 DIAGNOSIS — M25551 Pain in right hip: Secondary | ICD-10-CM

## 2013-04-16 DIAGNOSIS — M25559 Pain in unspecified hip: Secondary | ICD-10-CM | POA: Insufficient documentation

## 2013-04-16 NOTE — Telephone Encounter (Signed)
Pt was here yesterday, 04/15/13 for Dr visit and Mobic script was sent to pharmacy.  Pt says that is not one of the medications he takes and the script he needed, Ativan was not sent to pharmacy and he did not receive script in hand. Pt here in person.

## 2013-04-19 MED ORDER — TRAMADOL HCL 50 MG PO TABS: 50.0000 mg | ORAL_TABLET | Freq: Three times a day (TID) | ORAL | Status: DC | PRN

## 2013-04-19 NOTE — Addendum Note (Signed)
Addended by: Allyson Sabal MD, Ascencion Dike on: 04/19/2013 11:46 AM   Modules accepted: Orders, Medications

## 2013-04-20 ENCOUNTER — Telehealth: Payer: Self-pay | Admitting: *Deleted

## 2013-04-20 NOTE — Telephone Encounter (Signed)
Message copied by Dion Sibal, [place] R on Tue Apr 20, 2013 12:16 PM ------      Message from: Doran Heater      Created: Tue Apr 20, 2013 10:13 AM       Please let pt know hip xr wnl. Thanks aw ------

## 2013-05-10 ENCOUNTER — Ambulatory Visit: Payer: Medicare Other | Admitting: Internal Medicine

## 2013-07-14 ENCOUNTER — Ambulatory Visit: Payer: Medicare Other | Admitting: Internal Medicine

## 2013-07-15 ENCOUNTER — Other Ambulatory Visit: Payer: Self-pay | Admitting: Internal Medicine

## 2013-07-19 ENCOUNTER — Other Ambulatory Visit: Payer: Self-pay | Admitting: *Deleted

## 2013-07-19 DIAGNOSIS — I5022 Chronic systolic (congestive) heart failure: Secondary | ICD-10-CM

## 2013-07-20 MED ORDER — DILTIAZEM HCL ER COATED BEADS 120 MG PO CP24: 120.0000 mg | ORAL_CAPSULE | Freq: Every day | ORAL | Status: DC

## 2013-07-27 DIAGNOSIS — Z951 Presence of aortocoronary bypass graft: Secondary | ICD-10-CM | POA: Insufficient documentation

## 2013-07-27 DIAGNOSIS — R0602 Shortness of breath: Secondary | ICD-10-CM | POA: Insufficient documentation

## 2013-07-27 DIAGNOSIS — R079 Chest pain, unspecified: Secondary | ICD-10-CM | POA: Insufficient documentation

## 2013-07-27 DIAGNOSIS — F329 Major depressive disorder, single episode, unspecified: Secondary | ICD-10-CM | POA: Insufficient documentation

## 2013-07-27 DIAGNOSIS — F1021 Alcohol dependence, in remission: Secondary | ICD-10-CM | POA: Insufficient documentation

## 2013-07-27 DIAGNOSIS — Z87891 Personal history of nicotine dependence: Secondary | ICD-10-CM | POA: Insufficient documentation

## 2013-07-27 DIAGNOSIS — F32A Depression, unspecified: Secondary | ICD-10-CM | POA: Insufficient documentation

## 2013-07-27 DIAGNOSIS — F419 Anxiety disorder, unspecified: Secondary | ICD-10-CM | POA: Insufficient documentation

## 2013-07-27 DIAGNOSIS — E782 Mixed hyperlipidemia: Secondary | ICD-10-CM | POA: Insufficient documentation

## 2013-09-07 ENCOUNTER — Ambulatory Visit: Payer: Self-pay | Admitting: Internal Medicine

## 2013-09-29 IMAGING — CT CT CHEST W/ CM
3 of 4 series · 17 of 29 positions shown, 19 images · IV contrast (omnipaque)
Comparison: Plain film of 01/03/2011.  CT of 06/25/2008.

CLINICAL DATA: Chest pain.  Abnormal chest radiograph.  History of
ethanol abuse.  Atrial fibrillation.  Hepatitis B.

CT CHEST WITH CONTRAST
TECHNIQUE: Multidetector CT imaging of the chest was performed
following the standard protocol during bolus administration of
intravenous contrast.
Contrast: 80mL OMNIPAQUE IOHEXOL 300 MG/ML IV SOLN 80

[Series 2: routine chest · axial · 0.93mm/px · z∈[-266,-70]mm · 5 of 67 slices shown, 7 images]
[im 14/67  mediastinal]
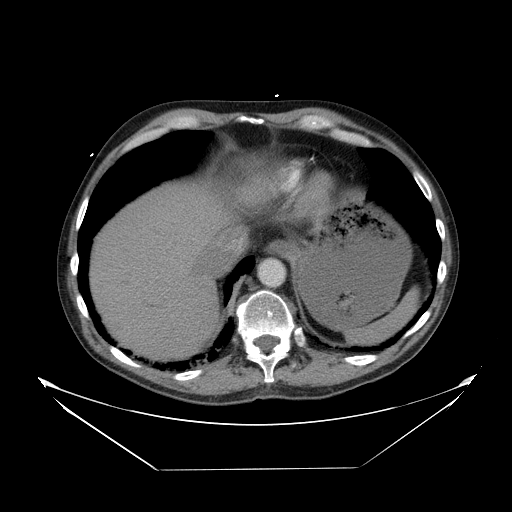
[im 14/67  lung]
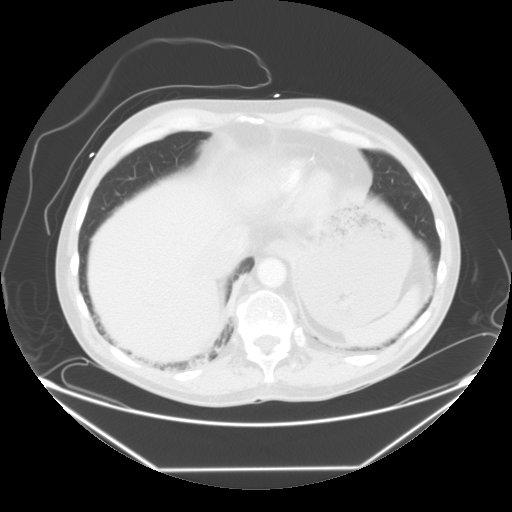
[im 27/67  lung]
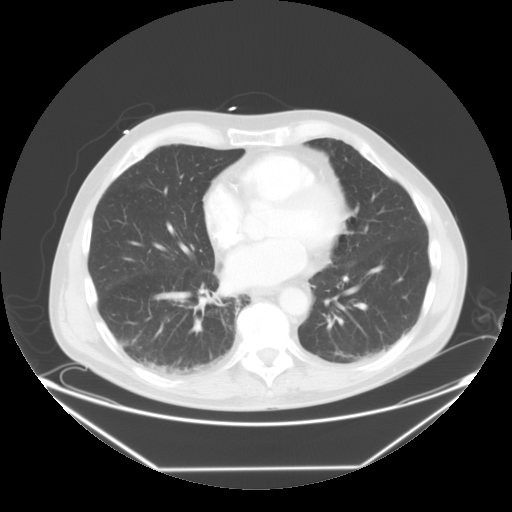
[im 34/67  lung]
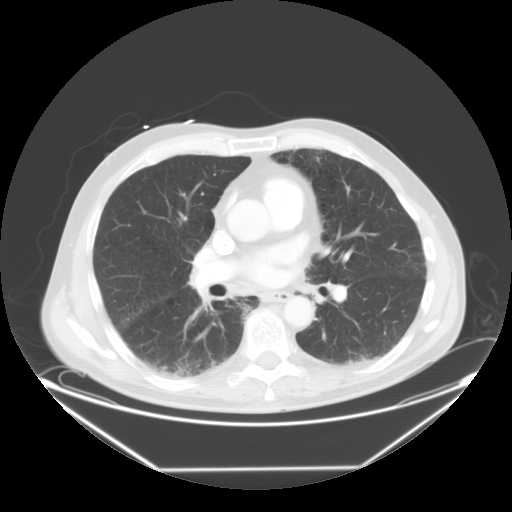
[im 40/67  lung]
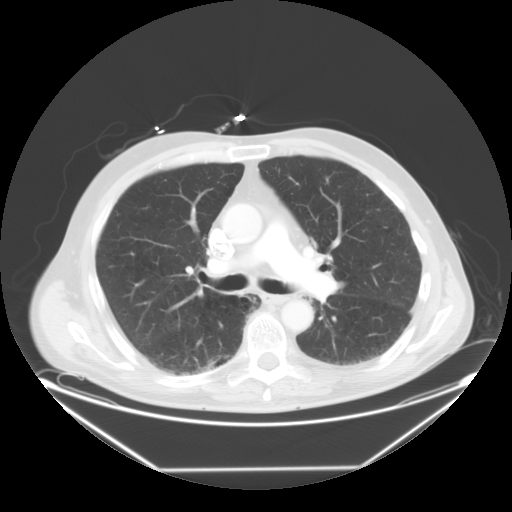
[im 53/67  mediastinal]
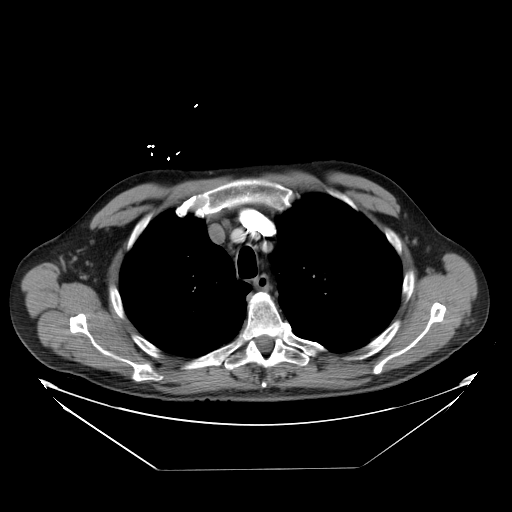
[im 53/67  lung]
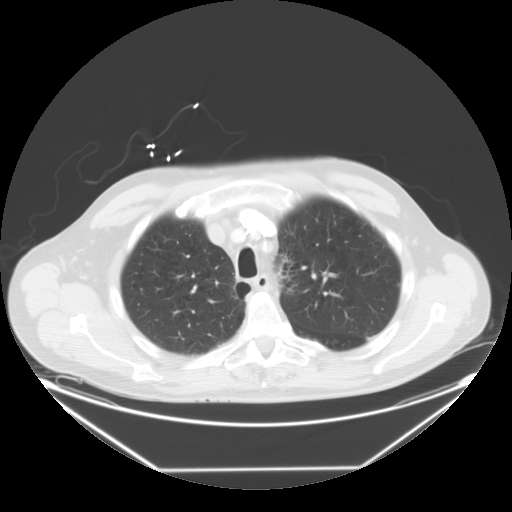

[Series 400: cor · coronal · 0.93mm/px · 4 of 102 slices shown]
[im 13/102  lung]
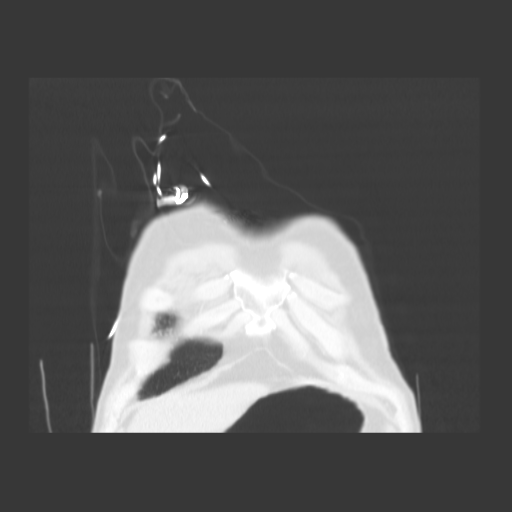
[im 26/102  lung]
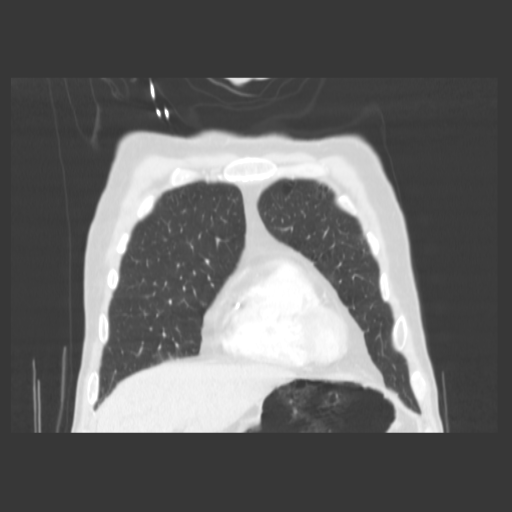
[im 38/102  lung]
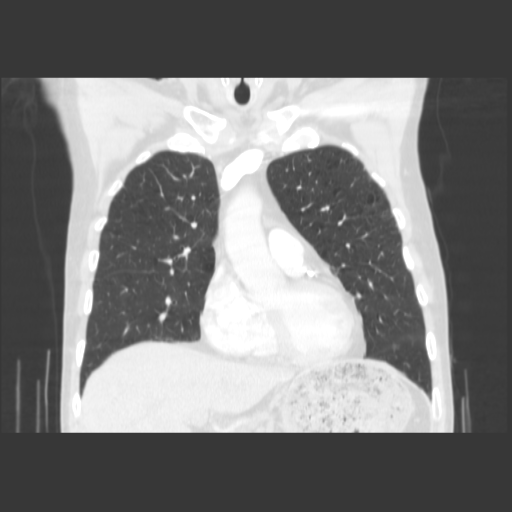
[im 51/102  lung]
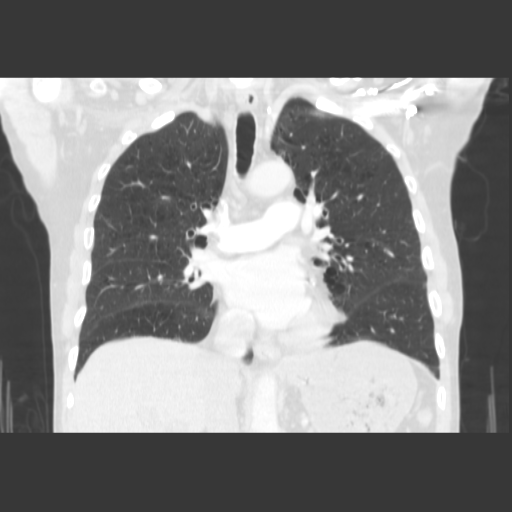

[Series 401: sag · sagittal · 0.93mm/px · 8 of 124 slices shown]
[im 13/124  lung]
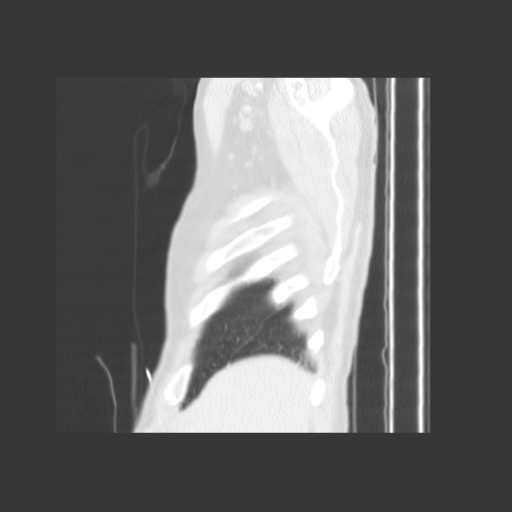
[im 25/124  lung]
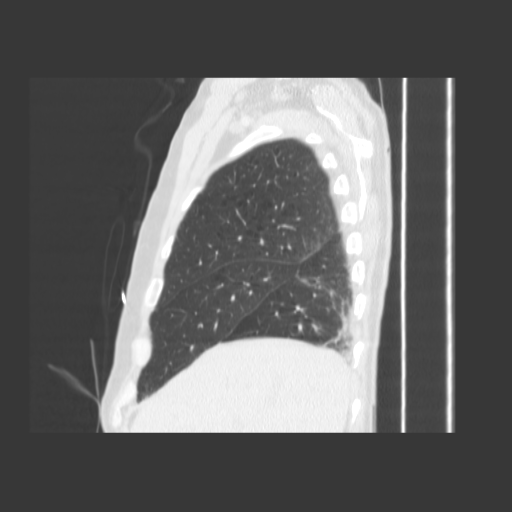
[im 37/124  lung]
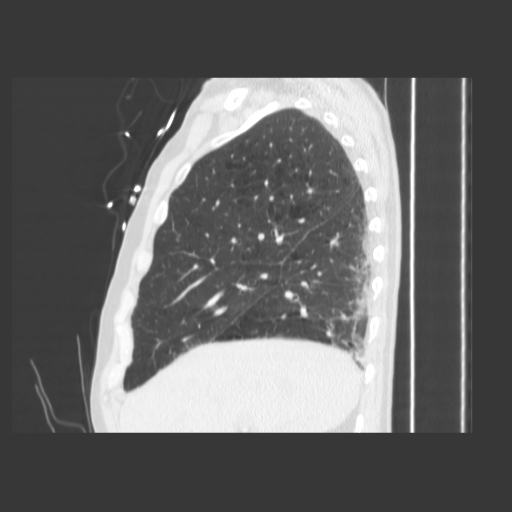
[im 50/124  lung]
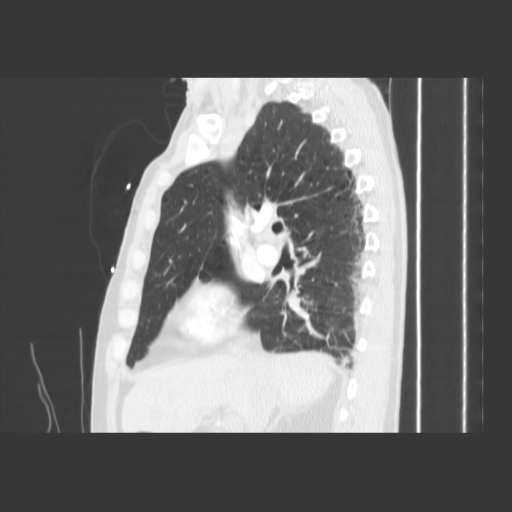
[im 74/124  lung]
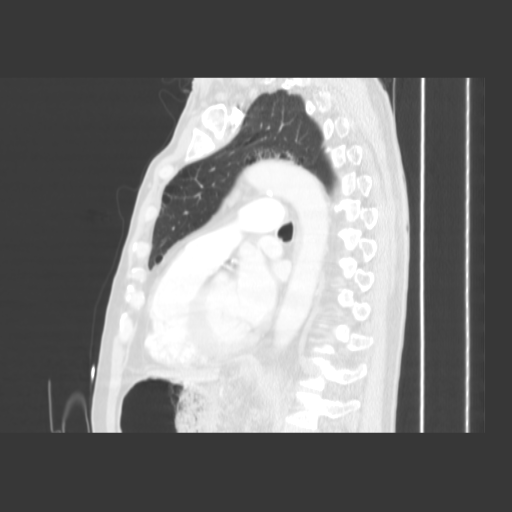
[im 87/124  lung]
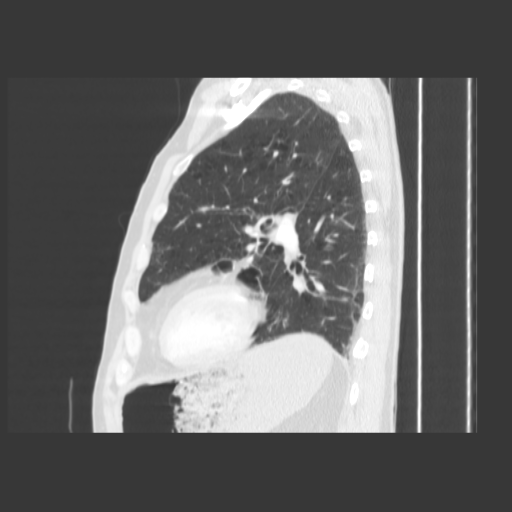
[im 99/124  lung]
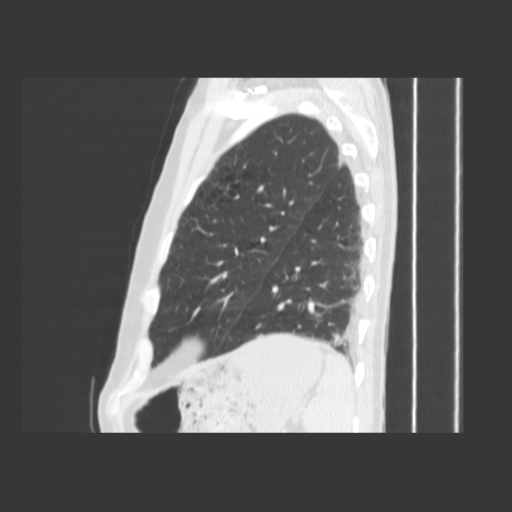
[im 111/124  lung]
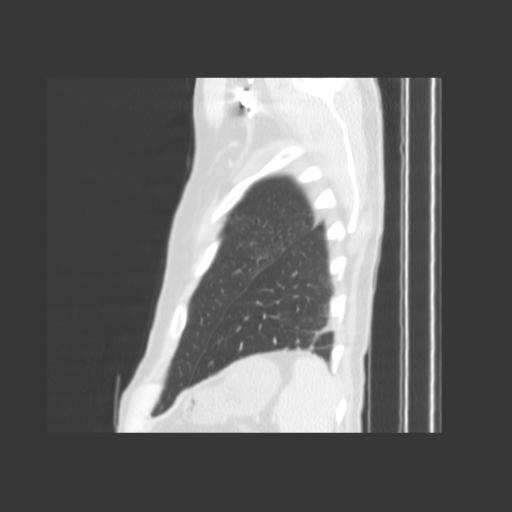

[17 of 29 positions shown; findings below may reference images not displayed]

FINDINGS: Lung windows demonstrate mild to moderate centrilobular
and paraseptal emphysema.  Mild bibasilar volume loss.
A tiny somewhat linear left upper lobe nodule measures 6 mm on
image 18 and was similar on image 31 of the 06/25/2008 exam.  No
pulmonary parenchymal correlate for the questioned plain film
abnormality.

Soft tissue windows demonstrate normal heart size without
pericardial or pleural effusion.

Multivessel coronary artery atherosclerosis.  No central pulmonary
embolism, on this non-dedicated study.  Small mediastinal lymph
nodes are slightly enlarged since the prior exam, but not
pathologic by size criteria.  Mildly prominent right hilar nodal
tissue, without adenopathy.

Limited abdominal imaging demonstrates no significant findings.
Tenth and eleventh remote left rib fractures. Tiny bilateral renal
lesions, likely cysts.
IMPRESSION: 1.  Centrilobular and paraseptal emphysema, without acute process
in the chest.  No CT correlate for the questioned right lung
abnormalities/nodule.
2.  Coronary artery atherosclerosis.
3.  Stable left upper lobe 6 mm lung nodule, consistent with
benignity.

## 2013-11-03 ENCOUNTER — Telehealth (INDEPENDENT_AMBULATORY_CARE_PROVIDER_SITE_OTHER): Payer: Self-pay

## 2013-11-03 NOTE — Telephone Encounter (Signed)
Pt called in requesting appointment to see Dr Hassell Done. He has hx of colon cancer and hemorrhoidectomy. He has been having difficulty with bowels movements. He states she is either constipated or has diarrhea and cannot find a happy medium between the two. He also states his stool is very thin. I asked if he has seen his GI doctor about tis and he states no and that he would rather see Dr Hassell Done about this. appt made for next week.

## 2013-11-06 ENCOUNTER — Emergency Department (HOSPITAL_COMMUNITY)
Admission: EM | Admit: 2013-11-06 | Discharge: 2013-11-06 | Disposition: A | Discharge status: Home / Self Care | Payer: Medicare HMO | Attending: Emergency Medicine | Admitting: Emergency Medicine

## 2013-11-06 ENCOUNTER — Emergency Department (HOSPITAL_COMMUNITY): Payer: Medicare HMO

## 2013-11-06 ENCOUNTER — Encounter (HOSPITAL_COMMUNITY): Payer: Self-pay | Admitting: Emergency Medicine

## 2013-11-06 DIAGNOSIS — Z9889 Other specified postprocedural states: Secondary | ICD-10-CM | POA: Insufficient documentation

## 2013-11-06 DIAGNOSIS — Z85038 Personal history of other malignant neoplasm of large intestine: Secondary | ICD-10-CM | POA: Insufficient documentation

## 2013-11-06 DIAGNOSIS — K5900 Constipation, unspecified: Secondary | ICD-10-CM | POA: Diagnosis not present

## 2013-11-06 DIAGNOSIS — I2510 Atherosclerotic heart disease of native coronary artery without angina pectoris: Secondary | ICD-10-CM | POA: Insufficient documentation

## 2013-11-06 DIAGNOSIS — R109 Unspecified abdominal pain: Secondary | ICD-10-CM | POA: Insufficient documentation

## 2013-11-06 DIAGNOSIS — I4891 Unspecified atrial fibrillation: Secondary | ICD-10-CM | POA: Diagnosis not present

## 2013-11-06 DIAGNOSIS — R011 Cardiac murmur, unspecified: Secondary | ICD-10-CM | POA: Diagnosis not present

## 2013-11-06 DIAGNOSIS — R110 Nausea: Secondary | ICD-10-CM | POA: Diagnosis not present

## 2013-11-06 DIAGNOSIS — Z79899 Other long term (current) drug therapy: Secondary | ICD-10-CM | POA: Diagnosis not present

## 2013-11-06 DIAGNOSIS — R222 Localized swelling, mass and lump, trunk: Secondary | ICD-10-CM | POA: Insufficient documentation

## 2013-11-06 DIAGNOSIS — R918 Other nonspecific abnormal finding of lung field: Secondary | ICD-10-CM

## 2013-11-06 DIAGNOSIS — Z8719 Personal history of other diseases of the digestive system: Secondary | ICD-10-CM | POA: Insufficient documentation

## 2013-11-06 DIAGNOSIS — Z951 Presence of aortocoronary bypass graft: Secondary | ICD-10-CM | POA: Diagnosis not present

## 2013-11-06 DIAGNOSIS — K5901 Slow transit constipation: Secondary | ICD-10-CM

## 2013-11-06 DIAGNOSIS — F17200 Nicotine dependence, unspecified, uncomplicated: Secondary | ICD-10-CM | POA: Insufficient documentation

## 2013-11-06 LAB — COMPREHENSIVE METABOLIC PANEL
ALBUMIN: 4.2 g/dL (ref 3.5–5.2)
ALT: 10 U/L (ref 0–53)
ANION GAP: 17 — AB (ref 5–15)
AST: 19 U/L (ref 0–37)
Alkaline Phosphatase: 124 U/L — ABNORMAL HIGH (ref 39–117)
BUN: 13 mg/dL (ref 6–23)
CO2: 21 mEq/L (ref 19–32)
CREATININE: 0.87 mg/dL (ref 0.50–1.35)
Calcium: 9.4 mg/dL (ref 8.4–10.5)
Chloride: 95 mEq/L — ABNORMAL LOW (ref 96–112)
GFR calc Af Amer: >90 mL/min (ref >90)
GFR calc non Af Amer: 87 mL/min — ABNORMAL LOW (ref >90)
Glucose, Bld: 86 mg/dL (ref 70–99)
POTASSIUM: 4.5 meq/L (ref 3.7–5.3)
Sodium: 133 mEq/L — ABNORMAL LOW (ref 137–147)
TOTAL PROTEIN: 7.6 g/dL (ref 6.0–8.3)
Total Bilirubin: 0.3 mg/dL (ref 0.3–1.2)

## 2013-11-06 LAB — CBC WITH DIFFERENTIAL/PLATELET
BASOS PCT: 0 % (ref 0–1)
Basophils Absolute: 0 10*3/uL (ref 0.0–0.1)
EOS PCT: 1 % (ref 0–5)
Eosinophils Absolute: 0.1 10*3/uL (ref 0.0–0.7)
HEMATOCRIT: 44.3 % (ref 39.0–52.0)
HEMOGLOBIN: 15.4 g/dL (ref 13.0–17.0)
LYMPHS ABS: 2.7 10*3/uL (ref 0.7–4.0)
Lymphocytes Relative: 26 % (ref 12–46)
MCH: 31.3 pg (ref 26.0–34.0)
MCHC: 34.8 g/dL (ref 30.0–36.0)
MCV: 90 fL (ref 78.0–100.0)
MONO ABS: 1.1 10*3/uL — AB (ref 0.1–1.0)
Monocytes Relative: 10 % (ref 3–12)
Neutro Abs: 6.8 10*3/uL (ref 1.7–7.7)
Neutrophils Relative %: 63 % (ref 43–77)
Platelets: 191 10*3/uL (ref 150–400)
RBC: 4.92 MIL/uL (ref 4.22–5.81)
RDW: 13.8 % (ref 11.5–15.5)
WBC: 10.8 10*3/uL — ABNORMAL HIGH (ref 4.0–10.5)

## 2013-11-06 LAB — URINALYSIS, ROUTINE W REFLEX MICROSCOPIC
BILIRUBIN URINE: NEGATIVE
Glucose, UA: NEGATIVE mg/dL
Hgb urine dipstick: NEGATIVE
Ketones, ur: NEGATIVE mg/dL
Leukocytes, UA: NEGATIVE
NITRITE: NEGATIVE
Protein, ur: NEGATIVE mg/dL
SPECIFIC GRAVITY, URINE: 1.007 (ref 1.005–1.030)
UROBILINOGEN UA: 0.2 mg/dL (ref 0.0–1.0)
pH: 6.5 (ref 5.0–8.0)

## 2013-11-06 MED ORDER — IOHEXOL 300 MG/ML  SOLN: 50.0000 mL | Freq: Once | INTRAMUSCULAR | Status: DC | PRN

## 2013-11-06 NOTE — Discharge Instructions (Signed)
IMPRESSION: 1. Chest x-ray reveals an ill-defined lesion in the left upper lobe. This is suspicious for mass lesion/malignancy. If this does not clear on a follow-up chest x-ray, chest CT suggested for further evaluation. 2. Scattered air-fluid levels noted throughout the colon. This is suspicious for process such as diarrheal illness. No evidence of bowel obstruction. No free air.   We saw you in the ER for the constipation and bloating. All the results in the ER are normal,except for the imaging results. We are not sure what is causing your symptoms. See Dr. Hassell Done as planned. Take your xray results from above to your primary care doctor.

## 2013-11-06 NOTE — ED Notes (Signed)
He has just ambulated to out b.r. Where he passed a soft (not liquid) b.m. Which is brown in color ~337ml in volume.

## 2013-11-06 NOTE — ED Notes (Signed)
He states he's been having issues with constipation for about two weeks.  He states that about a week ago he took a large dose of Miralax, which produced a quantity of "orange liquid with some mucus".  He states this occurred again early this morning, plus he is having some abd. Distention and bloating.  He is in no distress.

## 2013-11-06 NOTE — ED Notes (Addendum)
Pt from home c/o abd pain, constipation and diarrhea with pencil-like stools. Pt reports that he passed reddish/orange mucous today. Pt has hx of colon CA May 2014 and internal hemorrhoidectomy December 2014. Pt denies N/V/D. Pt denies CP, SOB. Pt is A&O and in NAD

## 2013-11-06 NOTE — ED Provider Notes (Signed)
CSN: [id number]     Arrival date & time 11/06/13  1214 History   First MD Initiated Contact with Patient 11/06/13 1234     Chief Complaint  Patient presents with  . Abdominal Pain     (Consider location/radiation/quality/duration/timing/severity/associated sxs/prior Treatment) HPI Comments: Pt with hx of colon CA s/p resection, CAD comes in with cc of abd pain. Pt states that the last 1 week he has some dull pain, abdominal, with increased girth and bloating.  He has been getting constipated, and taking miralax, with poor results. PT also has had small, pencil like stools the last 2 weeks. He has no emesis. No blood in the stool. Pt had a neg colonscopy in December, 2014. Pt had simlar sx when he was diagnosed with colon CA.  Patient is a 68 y.o. male presenting with abdominal pain. The history is provided by the patient and medical records.  Abdominal Pain Associated symptoms: constipation and nausea   Associated symptoms: no chest pain, no chills, no cough, no dysuria, no fever, no shortness of breath and no vomiting     Past Medical History  Diagnosis Date  . Atrial fibrillation     diagnosed 2010  . 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 03/21/2011 - 2D Echo: EF 25% to 30%. Diffuse hypokinesis.  Marland Kitchen HEMORRHOIDS 01/17/2009  . ALCOHOL ABUSE 01/18/2009  . Heart murmur   . Insomnia     TAKES ATIVAN TO SLEEP  . Colon cancer   . Hepatitis     hepatitis B-never treated-Titers were low  . Coronary artery disease     LAST SEEN BY CARIOLOGIST IN [facility] 04/2012   Past Surgical History  Procedure Laterality Date  . Mouth surgery    . Cardiac catheterization    . Colonoscopy N/A 02/11/2013    Procedure: COLONOSCOPY;  Surgeon: Milus Banister, MD;  Location: [facility] ENDOSCOPY;  Service: Endoscopy;  Laterality: N/A;  . Colon surgery  07/2012    COLON CANCER  . Coronary artery bypass graft      triple  bypass OCT 2013 in [place]  . Hemorrhoid surgery N/A 03/09/2013     Procedure: HEMORRHOIDECTOMY;  Surgeon: Pedro Earls, MD;  Location: [facility] ORS;  Service: General;  Laterality: N/A;   Family History  Problem Relation Age of Onset  . Heart disease Neg Hx   . Asthma Mother    History  Substance Use Topics  . Smoking status: Current Every Day Smoker -- 1.00 packs/day for 40 years    Types: Cigarettes  . Smokeless tobacco: Never Used  . Alcohol Use: No     Comment: Last consumption of alcohol-3 months ago.    Review of Systems  Constitutional: Negative for fever, chills and activity change.  Eyes: Negative for visual disturbance.  Respiratory: Negative for cough, chest tightness and shortness of breath.   Cardiovascular: Negative for chest pain.  Gastrointestinal: Positive for nausea, abdominal pain and constipation. Negative for vomiting, blood in stool and abdominal distention.  Genitourinary: Negative for dysuria, enuresis and difficulty urinating.  Musculoskeletal: Negative for arthralgias and neck pain.  Allergic/Immunologic: Negative for immunocompromised state.  Neurological: Negative for dizziness, light-headedness and headaches.  Psychiatric/Behavioral: Negative for confusion.      Allergies  Review of patient's allergies indicates no known allergies.  Home Medications   Prior to Admission medications   Medication Sig Start Date End Date Taking? Authorizing Provider  diltiazem (CARDIZEM CD) 120 MG 24 hr capsule Take 1  capsule (120 mg total) by mouth daily with breakfast.   Yes Robbie Lis, MD  LORazepam (ATIVAN) 2 MG tablet Take 1 tablet (2 mg total) by mouth at bedtime. 04/15/13  Yes Doran Heater, MD  polyethylene glycol [facility] / GLYCOLAX) packet Take 250 g by mouth once.   Yes Historical Provider, MD   BP 114/72  Pulse 64  Temp(Src) 97.9 F (36.6 C) (Oral)  Resp 16  SpO2 100% Physical Exam  Nursing note and vitals reviewed. Constitutional: He is oriented to person, place, and time. He appears well-developed.  HENT:   Head: Normocephalic and atraumatic.  Eyes: Conjunctivae and EOM are normal. Pupils are equal, round, and reactive to light.  Neck: Normal range of motion. Neck supple.  Cardiovascular: Normal rate and regular rhythm.   Pulmonary/Chest: Effort normal and breath sounds normal.  Abdominal: Soft. Bowel sounds are normal. He exhibits distension. There is no tenderness. There is no rebound and no guarding.  DRE deferred by patient  Neurological: He is alert and oriented to person, place, and time.  Skin: Skin is warm.    ED Course  Procedures (including critical care time) Labs Review Labs Reviewed  CBC WITH DIFFERENTIAL - Abnormal; Notable for the following:    WBC 10.8 (*)    Monocytes Absolute 1.1 (*)    All other components within normal limits  COMPREHENSIVE METABOLIC PANEL - Abnormal; Notable for the following:    Sodium 133 (*)    Chloride 95 (*)    Alkaline Phosphatase 124 (*)    GFR calc non Af Amer 87 (*)    Anion gap 17 (*)    All other components within normal limits  URINALYSIS, ROUTINE W REFLEX MICROSCOPIC - Abnormal; Notable for the following:    APPearance CLOUDY (*)    All other components within normal limits    Imaging Review Dg Abd Acute W/chest  11/06/2013   CLINICAL DATA:  Constipation and diarrhea .  EXAM: ACUTE ABDOMEN SERIES (ABDOMEN 2 VIEW & CHEST 1 VIEW)  COMPARISON:  Chest x-ray 03/02/2013, 12/02/2011.  CT 12/24/2012.  FINDINGS: The chest x-ray reveals ill-defined left upper lobe lesion. Although focal infiltrate could present in this fashion, this is suspicious for mass lesion/malignancy. This is a new finding. Prior CABG. Left atrial appendage clip noted. Borderline cardiomegaly, no CHF.  Soft tissues of the abdomen are unremarkable. Aortoiliac atherosclerotic vascular disease. Pelvic phleboliths. Scattered air-fluid levels are noted in the colon. A diarrheal illness could present in this fashion. No acute bony abnormality.  IMPRESSION: 1. Chest x-ray  reveals an ill-defined lesion in the left upper lobe. This is suspicious for mass lesion/malignancy. If this does not clear on a follow-up chest x-ray, chest CT suggested for further evaluation. 2. Scattered air-fluid levels noted throughout the colon. This is suspicious for process such as diarrheal illness. No evidence of bowel obstruction. No free air.   Electronically Signed   By: Marcello Moores  Register   On: 11/06/2013 13:54     EKG Interpretation None      MDM   Final diagnoses:  Slow transit constipation  Mass of lower lobe of left lung  possible ileus  Pt comes in with some epigastric discomfort, increased girth, bloating and constipation - with pencil like stools. AAS is showing some irritation. There is no obstruction seen, but there are some non specific findings. Pt also had LLQ mass.  CT chest and abd ordered (to assess the abd and the LLL lung mass). Rational discussed  with the patient. Patient however has an appt with Gen Surge, Dr. Hassell Done on Wed, and prefers to defer further testing until evaluated by him.  Pt had concerns for tumor. Which clinically possible with pencil type stools, constipation and increased abd girth, however, neg COLONOSCOPY just 8 months ago, makes it less likely.  I think it is fine for outpatient f/u. Return precautions have been discussed. Stable for d/c.   Varney Biles, MD 11/06/13 1726

## 2013-11-08 IMAGING — US US ABDOMEN COMPLETE
1 series · 13 of 25 positions shown · non-contrast
Comparison: 10/04/2009

CLINICAL DATA: Abdominal pain, chest pain and history of hepatitis
B infection and alcohol abuse.

ABDOMEN ULTRASOUND
TECHNIQUE: Complete abdominal ultrasound examination was performed
including evaluation of the liver, gallbladder, bile ducts,
pancreas, kidneys, spleen, IVC, and abdominal aorta.

[Series 1: us abdomen complete · 0.26mm/px · 13 of 76 slices shown]
[im 1/76]
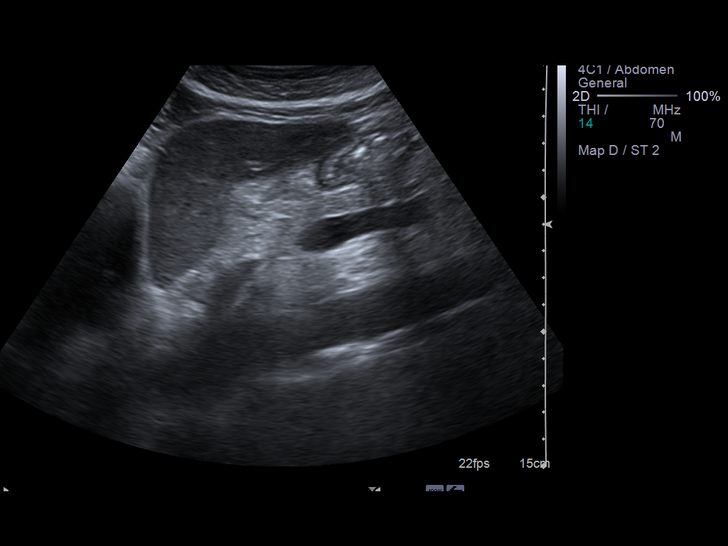
[im 7/76]
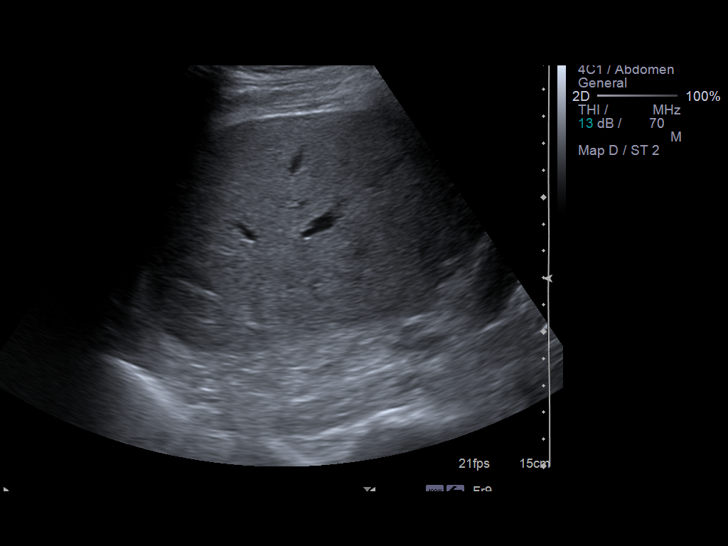
[im 13/76]
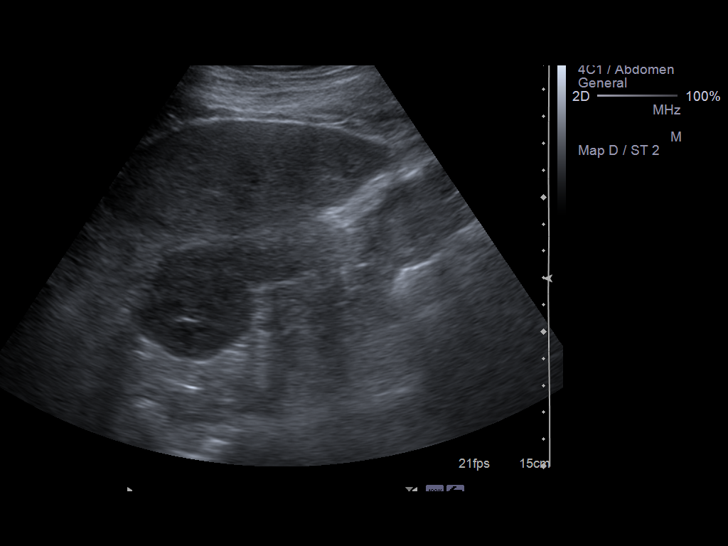
[im 19/76]
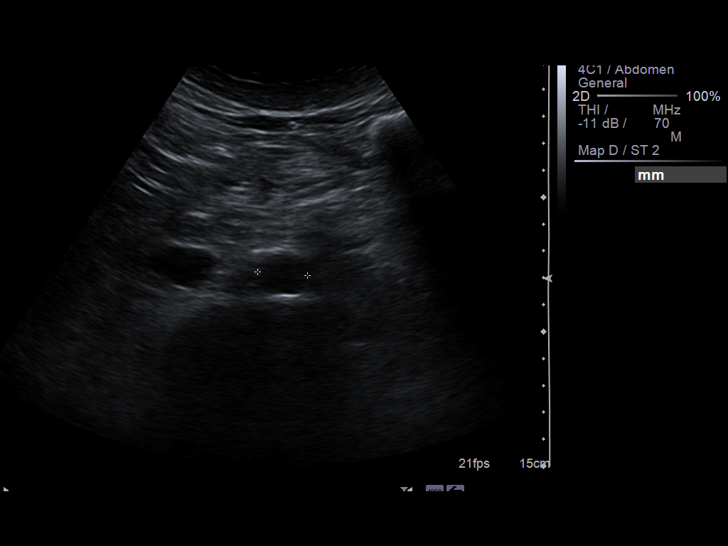
[im 26/76]
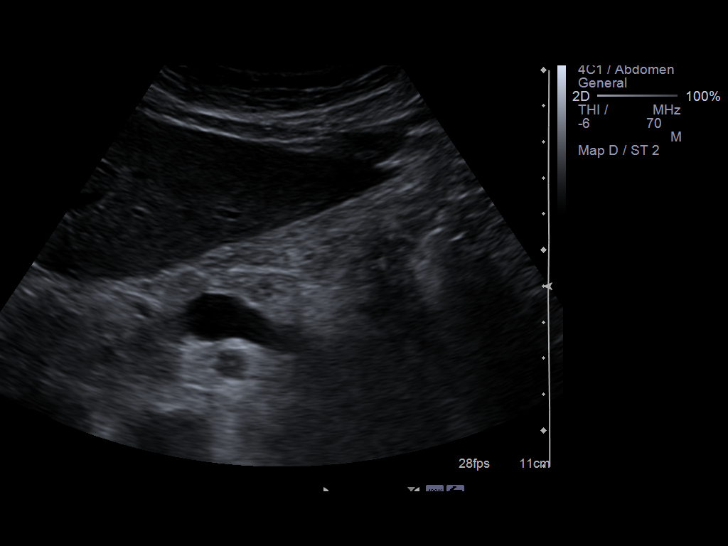
[im 32/76]
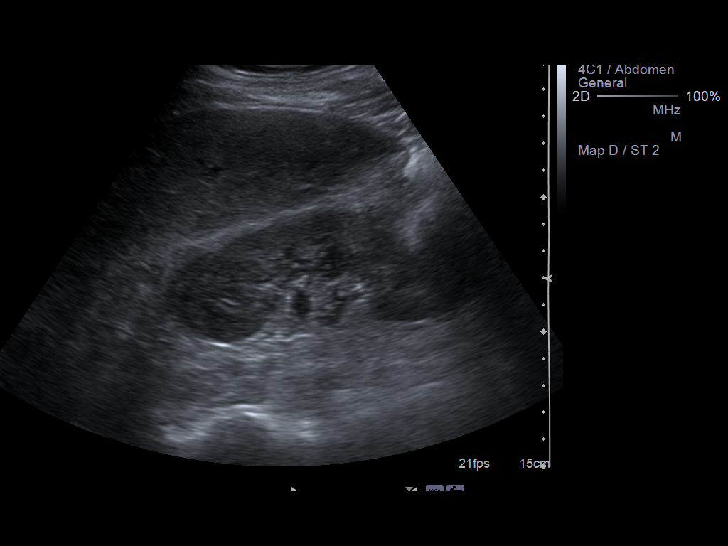
[im 38/76]
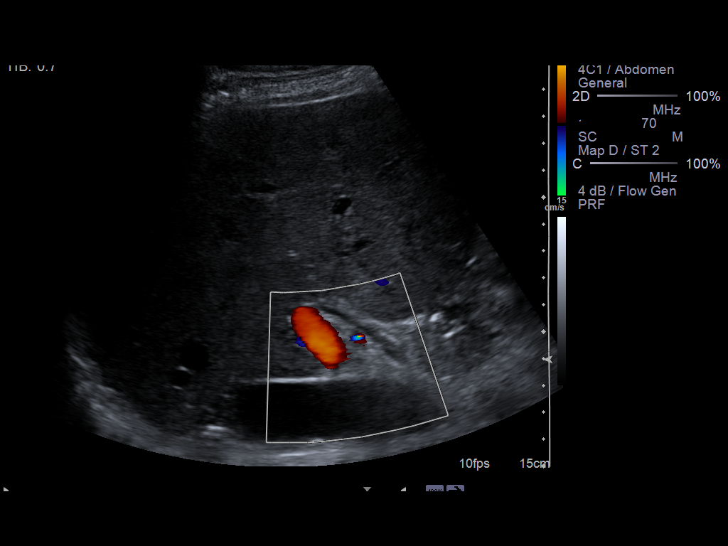
[im 44/76]
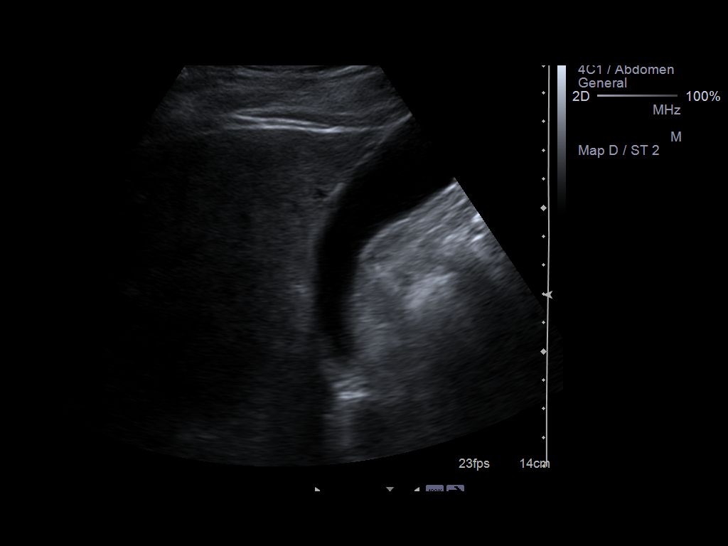
[im 51/76]
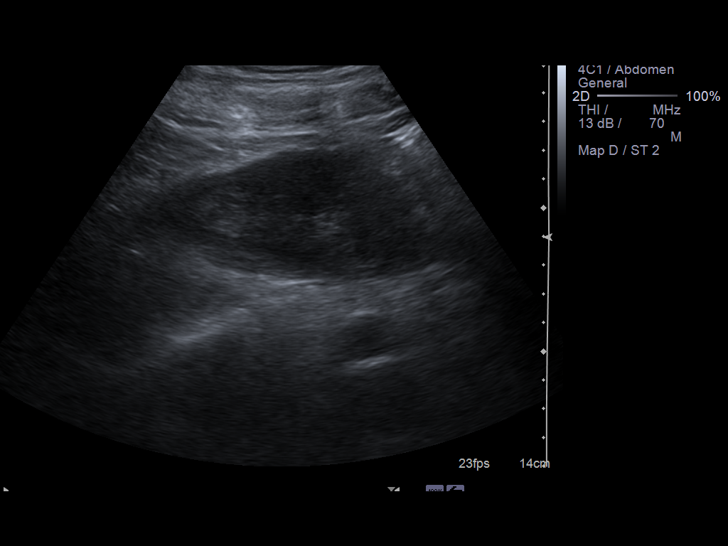
[im 57/76]
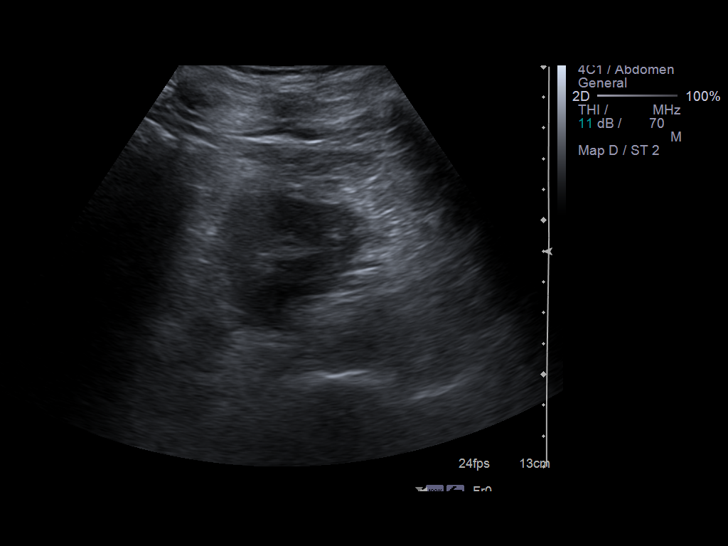
[im 63/76]
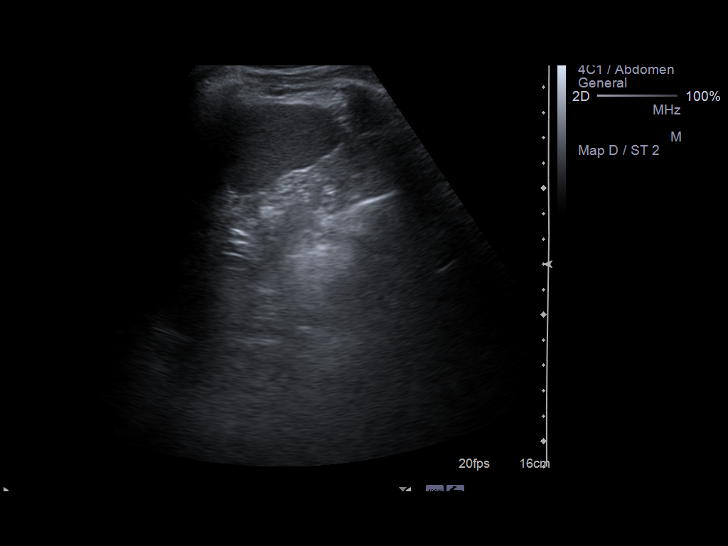
[im 69/76]
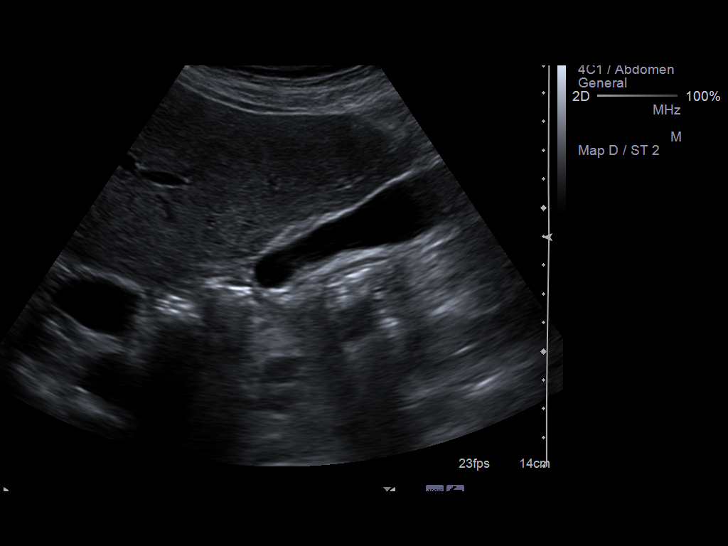
[im 76/76]
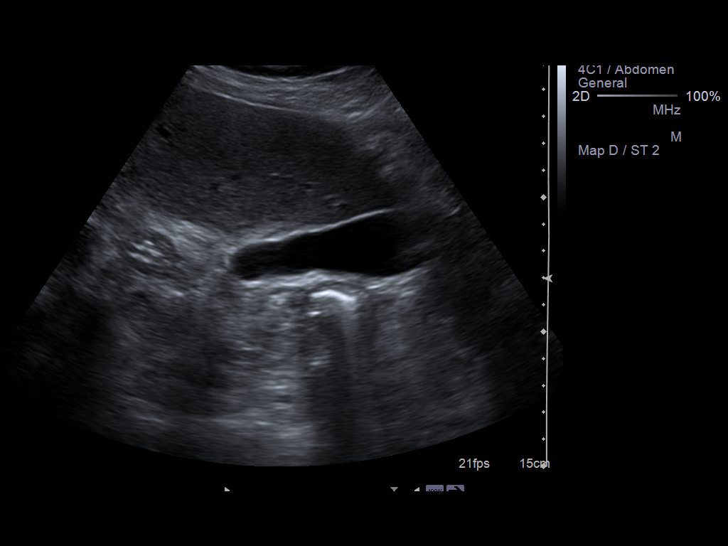

[13 of 25 positions shown; findings below may reference images not displayed]

FINDINGS: Gallbladder:  No shadowing gallstones or echogenic sludge.  No
gallbladder wall thickening or pericholecystic fluid.  Negative
sonographic Murphy's sign according to the ultrasound technologist.

Common Bile Duct:  Normal caliber of 5 mm.

Liver:  Liver echotexture is mildly heterogeneous without obvious
focal mass or intrahepatic biliary ductal dilatation.  The portal
vein is open.

IVC:  Patent throughout its visualized course in the abdomen.

Pancreas:  Although the pancreas is difficult to visualize in its
entirety, no focal pancreatic abnormality is identified.

Spleen:  The spleen shows normal echotexture and size.

Kidneys:  The right kidney measures 11.6 cm and the left kidney
12.0 cm. Both show no evidence of hydronephrosis or focal lesion.
Right renal cortex is mildly echogenic, suggesting component of
chronic disease.  Left renal cortex shows normal echogenicity.

Abdominal Aorta:  Abdominal aorta shows atherosclerotic changes. No
evidence of abdominal aortic aneurysm with maximal measured AP
diameter of 2.8 cm.
IMPRESSION: 1.  No evidence of abdominal aortic aneurysm.
2.  Mildly increased echogenicity of the right renal cortex
suggesting component of underlying chronic disease.

## 2013-11-10 ENCOUNTER — Ambulatory Visit (INDEPENDENT_AMBULATORY_CARE_PROVIDER_SITE_OTHER): Payer: Medicare Other | Admitting: Surgery

## 2013-11-10 ENCOUNTER — Other Ambulatory Visit: Payer: Self-pay | Admitting: Family Medicine

## 2013-11-10 DIAGNOSIS — R9389 Abnormal findings on diagnostic imaging of other specified body structures: Secondary | ICD-10-CM

## 2013-11-16 ENCOUNTER — Ambulatory Visit
Admission: RE | Admit: 2013-11-16 | Discharge: 2013-11-16 | Disposition: A | Discharge status: Home / Self Care | Payer: Commercial Managed Care - HMO | Source: Ambulatory Visit | Attending: Family Medicine | Admitting: Family Medicine

## 2013-11-16 DIAGNOSIS — R9389 Abnormal findings on diagnostic imaging of other specified body structures: Secondary | ICD-10-CM

## 2013-11-16 MED ORDER — IOHEXOL 300 MG/ML  SOLN
75.0000 mL | Freq: Once | INTRAMUSCULAR | Status: AC | PRN
  Administered 2013-11-16: 75 mL via INTRAVENOUS

## 2013-11-29 ENCOUNTER — Other Ambulatory Visit: Payer: Medicare HMO

## 2013-11-29 ENCOUNTER — Telehealth: Payer: Self-pay | Admitting: *Deleted

## 2013-11-29 ENCOUNTER — Ambulatory Visit (INDEPENDENT_AMBULATORY_CARE_PROVIDER_SITE_OTHER): Payer: Medicare HMO | Admitting: Physician Assistant

## 2013-11-29 ENCOUNTER — Ambulatory Visit (INDEPENDENT_AMBULATORY_CARE_PROVIDER_SITE_OTHER): Payer: Commercial Managed Care - HMO | Admitting: Emergency Medicine

## 2013-11-29 ENCOUNTER — Encounter: Payer: Self-pay | Admitting: Physician Assistant

## 2013-11-29 ENCOUNTER — Encounter: Payer: Self-pay | Admitting: Emergency Medicine

## 2013-11-29 VITALS — BP 100/60 | HR 65 | Temp 97.8°F | Ht 77.0 in | Wt 213.0 lb

## 2013-11-29 VITALS — BP 130/84 | HR 84 | Ht 76.0 in | Wt 214.2 lb

## 2013-11-29 DIAGNOSIS — R109 Unspecified abdominal pain: Secondary | ICD-10-CM

## 2013-11-29 DIAGNOSIS — R911 Solitary pulmonary nodule: Secondary | ICD-10-CM | POA: Insufficient documentation

## 2013-11-29 DIAGNOSIS — Z85038 Personal history of other malignant neoplasm of large intestine: Secondary | ICD-10-CM

## 2013-11-29 DIAGNOSIS — F17200 Nicotine dependence, unspecified, uncomplicated: Secondary | ICD-10-CM

## 2013-11-29 DIAGNOSIS — K5900 Constipation, unspecified: Secondary | ICD-10-CM

## 2013-11-29 NOTE — Assessment & Plan Note (Signed)
Will need to address cessation next visit.

## 2013-11-29 NOTE — Patient Instructions (Signed)
Please go to the basement level to have your labs drawn.   You have been scheduled for a CT scan of the abdomen and pelvis at [facility] ([address]---this is in the same building as Press photographer).   You are scheduled on 11-30-2013 Tues at 1:30 PM . You should arrive 15 minutes prior to your appointment time for registration. Please follow the written instructions below on the day of your exam:  WARNING: IF YOU ARE ALLERGIC TO IODINE/X-RAY DYE, PLEASE NOTIFY RADIOLOGY IMMEDIATELY AT [phone]! YOU WILL BE GIVEN A 13 HOUR PREMEDICATION PREP.  1) Do not eat or drink anything after 1:30 PM  (4 hours prior to your test) 2) You have been given 2 bottles of oral contrast to drink. The solution may taste better if refrigerated, but do NOT add ice or any other liquid to this solution. Shake   well before drinking.    Drink 1 bottle of contrast @ 11:30 AM (2 hours prior to your exam)  Drink 1 bottle of contrast @ 12:30 PM  (1 hour prior to your exam)  You may take any medications as prescribed with a small amount of water except for the following: Metformin, Glucophage, Glucovance, Avandamet, Riomet, Fortamet, Actoplus Met, Janumet, Glumetza or Metaglip. The above medications must be held the day of the exam AND 48 hours after the exam.  The purpose of you drinking the oral contrast is to aid in the visualization of your intestinal tract. The contrast solution may cause some diarrhea. Before your exam is started, you will be given a small amount of fluid to drink. Depending on your individual set of symptoms, you may also receive an intravenous injection of x-ray contrast/dye. Plan on being at [facility] for 30 minutes or long, depending on the type of exam you are having performed.  If you have any questions regarding your exam or if you need to reschedule, you may call the CT department at [phone] between the hours of 8:00 am and 5:00 pm,  Monday-Friday.  ________________________________________________________________________

## 2013-11-29 NOTE — Progress Notes (Signed)
Subjective:    Patient ID: [name], male    DOB: 1945-04-05, 68 y.o.   MRN: [id number]  HPI  [name] is a pleasant 68 year old white male known to Dr. Ardis Hughs. He has history of coronary artery disease is status post CABG in 2014 also with a dilated cardiomyopathy and EF of 25-30% and history of atrial fibrillation. He is maintained on Eliquis. He had undergone a sigmoid colectomy for colon cancer in May of 2014 done in [place]. Get a followup colonoscopy in December 2014 per Dr. Ardis Hughs which showed a normal anastomosis at 15 cm was noted to have medium internal and external hemorrhoids. It had problems with symptoms from his hemorrhoids and underwent hemorrhoidectomy in December of 2014 per Dr. Hassell Done.  Patient says he has recently been having problems with recurrent constipation and abdominal pains and discomfort. He says about a month ago he started noticing narrower caliber of stools which was very similar to when he was diagnosed with colon cancer and this has been very concerned. He says frequently have an urge for bowel movement but is unable to go to the bathroom. He is basically not having any bowel movements did tell he takes a laxative. He is generally taking 3 do collapse daily and did not fill the MiraLax was helpful because it did more quickly enough. He says he gets very uncomfortable distended feeling if he doesn't have a bowel movement about every day. His appetite has been good his weight has been stable. Patient had undergone CT scan of the chest on 11/16/2013 per his PCP was noted to have patchy ill-defined bilateral infiltrates and shotty lymph nodes was felt this may be a multifocal pneumonia however could not rule out metastatic disease. He was treated with a course of doxycycline and has an appointment to be seen by pulmonary later today. CT of the abdomen and pelvis October of 2014 had shown some circumferential thickening of the ascending colon 2 tiny  low-density lesions in the right lobe of the liver may be cysts but followup CT was recommended in 6 months.    Review of Systems  Constitutional: Negative.   HENT: Negative.   Eyes: Negative.   Respiratory: Negative.   Cardiovascular: Negative.   Gastrointestinal: Positive for abdominal pain, constipation and abdominal distention.  Endocrine: Negative.   Genitourinary: Negative.   Musculoskeletal: Positive for back pain.  Skin: Negative.   Allergic/Immunologic: Negative.   Neurological: Negative.   Hematological: Negative.   Psychiatric/Behavioral: Negative.    Outpatient Prescriptions Prior to Visit  Medication Sig Dispense Refill  . LORazepam (ATIVAN) 2 MG tablet Take 1 tablet (2 mg total) by mouth at bedtime.  40 tablet  0  . diltiazem (CARDIZEM CD) 120 MG 24 hr capsule Take 1 capsule (120 mg total) by mouth daily with breakfast.  90 capsule  3  . polyethylene glycol (MIRALAX / GLYCOLAX) packet Take 250 g by mouth once.       No facility-administered medications prior to visit.      No Known Allergies Patient Active Problem List   Diagnosis Date Noted  . Status post hemorrhoidectomy Dec 2014 03/10/2013  . Hx of CABG 01/22/2013  . Insomnia 09/30/2012  . History of hepatitis B 09/09/2012  . Colon cancer 07/28/2012  . Thrombocytopenia 11/04/2011  . Chest pain 11/04/2011  . Chronic systolic heart failure 71/08/2692  . 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 ALCOHOL ABUSE 01/18/2009  . TOBACCO ABUSE 01/18/2009  .  DEPRESSION 01/17/2009   History  Substance Use Topics  . Smoking status: Current Every Day Smoker -- 1.00 packs/day for 40 years    Types: Cigarettes  . Smokeless tobacco: Never Used  . Alcohol Use: No     Comment: Last consumption of alcohol-3 months ago.   family history includes Asthma in his mother. There is no history of Heart disease.  Objective:   Physical Exam well-developed older white male in no acute distress, pleasant blood  pressure 130/84 pulse 84 height 6 foot 4 weight 214. HEENT ;nontraumatic normocephalic EOMI PERRLA sclera anicteric, Supple ;no JVD, Cardiovascular; regular rate and rhythm with S1-S2 Pulmonary; clear bilaterally, Abdomen; soft bowel sounds are present midline incisional scar no focal tenderness mild generalized tenderness no guarding or rebound no palpable mass or hepatosplenomegaly, Rectal; exam not done, Extremities; no clubbing cyanosis or edema skin warm and dry, Psych; mood and affect appropriate        Assessment & Plan:  #54  68 year old male with history of adenocarcinoma of the colon 2014 status post sigmoid colectomy, followup colonoscopy December 2014 with a normal-appearing anastomosis. Patient now presenting with constipation and narrower caliber stools and abdominal discomfort x4-6 weeks This may be functional constipation there was no evidence of stricturing of his anastomosis on colonoscopy, given abnormal recent CT of the chest raising possibility of metastatic disease feel he should have CT of the abdomen and pelvis to rule out recurrence of colon cancer intra-abdominal. #2 chronic anticoagulation #3 coronary artery disease status post CABG #4 atrial fibrillation #5 dilated cardiomyopathy  Plan; schedule for CT scan of the abdomen and pelvis with contrast CEA Add daily probiotic As dulcolax has been effective he will continue to  2 dulcolax on a daily basis If CT scan is completely negative we may give him a trial of Linzess And he'll followup with myself or Dr. Ardis Hughs in one month

## 2013-11-29 NOTE — Assessment & Plan Note (Addendum)
LUL nodule is the dominant lesion, with a spiculated appearance, concerning for primary malignancy. Must also consider metastatic disease from Colon CA. There are scattered miniscule GG nodules present as well. He will need PFT at some point in the future, especially if we feel surgical referral is next step

## 2013-11-29 NOTE — Progress Notes (Signed)
Subjective:    Patient ID: [name], male    DOB: [birth year], 68 y.o.   MRN: [id number]  HPI 68 yo active smoker (39 pk-yrs), hx of CAD/CABG, cardiomyopathy, colon CA > treated surgically in 2014 ([place]). He was seen in the ED 11/06/13 with a CXR that showed a LUL nodule. Subsequent CT chest done 9/8 that showed a LUL nodule and some scattered small nodules. He was treated for CAP, but acknowledges that he did not have cough, fever, sputum, etc. He continues to have diarrhea and abd pain.    Review of Systems  Constitutional: Negative for fever, chills, activity change, appetite change and unexpected weight change.  HENT: Negative for congestion, dental problem, postnasal drip, rhinorrhea, sneezing, sore throat, trouble swallowing and voice change.   Eyes: Negative for visual disturbance.  Respiratory: Negative for cough, choking and shortness of breath.   Cardiovascular: Negative for chest pain and leg swelling.  Gastrointestinal: Positive for abdominal pain. Negative for nausea and vomiting.  Genitourinary: Negative for difficulty urinating.  Musculoskeletal: Negative for arthralgias.  Skin: Negative for rash.  Psychiatric/Behavioral: Negative for behavioral problems and confusion.   Patient Active Problem List   Diagnosis Date Noted  . Status post hemorrhoidectomy Dec 2014 03/10/2013  . Hx of CABG 01/22/2013  . Insomnia 09/30/2012  . History of hepatitis B 09/09/2012  . Colon cancer 07/28/2012  . Thrombocytopenia 11/04/2011  . Chest pain 11/04/2011  . Chronic systolic heart failure 50/93/2671  . 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 ALCOHOL ABUSE 01/18/2009  . TOBACCO ABUSE 01/18/2009  . DEPRESSION 01/17/2009      Family History  Problem Relation Age of Onset  . Heart disease Neg Hx   . Asthma Mother      History   Social History  . Marital Status: Divorced    Spouse Name: N/A    Number of Children: 2  . Years of Education: N/A    Occupational History  . RetiredAnimal nutritionist    .     Social History Main Topics  . Smoking status: Current Every Day Smoker -- 1.00 packs/day for 50 years    Types: Cigarettes  . Smokeless tobacco: Never Used  . Alcohol Use: No     Comment: 2 drinks per month  . Drug Use: No  . Sexual Activity: No   Other Topics Concern  . Not on file   Social History Narrative   Works in Customer service manager cars   Was homeless   Has moved homes x 3 in 2 months   States cannot always afford meds-See PI     No Known Allergies   Outpatient Prescriptions Prior to Visit  Medication Sig Dispense Refill  . amiodarone (PACERONE) 100 MG tablet Take 100 mg by mouth daily.      Marland Kitchen apixaban (ELIQUIS) 5 MG TABS tablet Take 5 mg by mouth 2 (two) times daily.      Marland Kitchen diltiazem (CARDIZEM CD) 120 MG 24 hr capsule Take 240 mg by mouth daily with breakfast.      . LORazepam (ATIVAN) 2 MG tablet Take 1 tablet (2 mg total) by mouth at bedtime.  40 tablet  0   No facility-administered medications prior to visit.        Objective:   Physical Exam Filed Vitals:   11/29/13 1429  BP: 100/60  Pulse: 65  Temp: 97.8 F (36.6 C)  TempSrc: Oral  Height: 6\' 5"  (1.956 m)  Weight:  213 lb (96.616 kg)  SpO2: 98%   Gen: Pleasant, well-nourished, in no distress,  normal affect  ENT: No lesions,  mouth clear,  oropharynx clear, no postnasal drip  Neck: No JVD, no TMG, no carotid bruits  Lungs: No use of accessory muscles, clear without rales or rhonchi  Cardiovascular: RRR, heart sounds normal, no murmur or gallops, no peripheral edema  Musculoskeletal: No deformities, no cyanosis or clubbing  Neuro: alert, non focal  Skin: Warm, no lesions or rashes      Assessment & Plan:   Pulmonary nodule, left LUL nodule is the dominant lesion, with a spiculated appearance, concerning for primary malignancy. Must also consider metastatic disease from Colon CA. There are scattered miniscule GG nodules  present as well. He will need PFT at some point in the future, especially if we feel surgical referral is next step  TOBACCO ABUSE Will need to address cessation next visit.

## 2013-11-29 NOTE — Progress Notes (Signed)
i agree with the above note, plan.  Also reviewed pulmonary note from today.

## 2013-11-29 NOTE — Patient Instructions (Signed)
We will repeat your CT scan of the chest in the first week of October 2015.  Follow with Dr Lamonte Sakai after the scan to review and to plan our next steps.

## 2013-11-29 NOTE — Telephone Encounter (Signed)
I left a message for the patient on his voice mail on home number  I advised we made him an appt to see Nicoletta Ba PA-C on 12-20-2013 at 9:30 am .

## 2013-11-30 ENCOUNTER — Ambulatory Visit (INDEPENDENT_AMBULATORY_CARE_PROVIDER_SITE_OTHER)
Admission: RE | Admit: 2013-11-30 | Discharge: 2013-11-30 | Disposition: A | Discharge status: Home / Self Care | Payer: Commercial Managed Care - HMO | Source: Ambulatory Visit | Attending: Physician Assistant | Admitting: Physician Assistant

## 2013-11-30 ENCOUNTER — Other Ambulatory Visit: Payer: Self-pay | Admitting: *Deleted

## 2013-11-30 DIAGNOSIS — Z85038 Personal history of other malignant neoplasm of large intestine: Secondary | ICD-10-CM

## 2013-11-30 DIAGNOSIS — R109 Unspecified abdominal pain: Secondary | ICD-10-CM

## 2013-11-30 DIAGNOSIS — K5900 Constipation, unspecified: Secondary | ICD-10-CM

## 2013-11-30 LAB — CEA: CEA: 3.6 ng/mL (ref 0.0–5.0)

## 2013-11-30 MED ORDER — IOHEXOL 300 MG/ML  SOLN
100.0000 mL | Freq: Once | INTRAMUSCULAR | Status: AC | PRN
  Administered 2013-11-30: 100 mL via INTRAVENOUS

## 2013-12-01 ENCOUNTER — Telehealth: Payer: Self-pay | Admitting: Physician Assistant

## 2013-12-01 NOTE — Telephone Encounter (Signed)
Patient is calling for CT results. Please, advise. 

## 2013-12-02 NOTE — Telephone Encounter (Signed)
Spoke with patient and gave him Dr. Kelby Fam explanation.

## 2013-12-02 NOTE — Telephone Encounter (Signed)
Had Dr. Deatra Ina (MD of day) to review CT. Nothing bad on it per MD. May need procedure to access inflammation in rectum. Left a message for patient to call back.

## 2013-12-02 NOTE — Telephone Encounter (Signed)
Upset he never heard back on his results yesterday. Told me if he did not hear back by 11am he would come in person and sit here until he gets his results.

## 2013-12-06 ENCOUNTER — Telehealth: Payer: Self-pay | Admitting: Physician Assistant

## 2013-12-06 MED ORDER — LINACLOTIDE 145 MCG PO CAPS: 145.0000 ug | ORAL_CAPSULE | Freq: Every day | ORAL | Status: DC

## 2013-12-06 NOTE — Telephone Encounter (Signed)
Patient is calling for Nicoletta Ba, PA recommendations on CT results. Please, advise.

## 2013-12-06 NOTE — Telephone Encounter (Signed)
Ct is negative except for possible proctitis- Ok to start him on Linzess 143mcg daily.. Will discuss ?proctitis with Dr. Ardis Hughs- he may need a flex also

## 2013-12-06 NOTE — Telephone Encounter (Signed)
Spoke with patient and gave him recommendations. Rx sent to pharmacy.

## 2013-12-08 ENCOUNTER — Telehealth: Payer: Self-pay | Admitting: *Deleted

## 2013-12-08 DIAGNOSIS — K629 Disease of anus and rectum, unspecified: Secondary | ICD-10-CM

## 2013-12-08 NOTE — Telephone Encounter (Signed)
Needs scheduled for Flex with Dr. Ardis Hughs as soon as possible-will need to hold anticoagulation

## 2013-12-08 NOTE — Telephone Encounter (Signed)
I think he does need flex sigmoidoscopy for the abnormal rectum, history of sigmoid colon cancer.

## 2013-12-08 NOTE — Telephone Encounter (Signed)
Jeffrey Ferguson, PA-C at 12/08/2013 12:53 PM    Status: Signed       Needs scheduled for Flex with Dr. Ardis Hughs as soon as possible-will need to hold anticoagulation       Jeffrey Banister, MD at 12/08/2013 7:19 AM     Status: Signed        I think he does need flex sigmoidoscopy for the abnormal rectum, history of sigmoid colon cancer.    Called patient and gave him recommendation. He states he has cardiology issues.(per snapshot ef is 25%). States his cardiologist is Dr. Serafina Royals at [facility] in [place]([phone]). Patient will need to be done at hospital based on EF.

## 2013-12-09 NOTE — Telephone Encounter (Signed)
Anti coag response sent to Dr Nehemiah Massed  Left message on machine to call back to set up flex

## 2013-12-10 ENCOUNTER — Telehealth: Payer: Self-pay | Admitting: Emergency Medicine

## 2013-12-10 ENCOUNTER — Ambulatory Visit (INDEPENDENT_AMBULATORY_CARE_PROVIDER_SITE_OTHER)
Admission: RE | Admit: 2013-12-10 | Discharge: 2013-12-10 | Disposition: A | Discharge status: Home / Self Care | Payer: Commercial Managed Care - HMO | Source: Ambulatory Visit | Attending: Emergency Medicine | Admitting: Emergency Medicine

## 2013-12-10 DIAGNOSIS — R911 Solitary pulmonary nodule: Secondary | ICD-10-CM

## 2013-12-10 NOTE — Telephone Encounter (Signed)
Spoke with the pt and notified of results He verbalized understanding  Nothing further needed

## 2013-12-10 NOTE — Telephone Encounter (Signed)
Pt is calling back.  Would like to get CT results.  Pt can be reached at [phone].  Satira Anis

## 2013-12-10 NOTE — Telephone Encounter (Signed)
I spoke with the pt and notified him that RB is seeing pt's at this time, but we will let RB know he is req these results today Pt verbalized understanding  Please advise, thanks!

## 2013-12-10 NOTE — Telephone Encounter (Signed)
Please let him know that his repeat CT scan shows that the LUL nodule has decreased slight;ly in size. I believe that we should continue to follow this for stability. He does not need any kind of biopsy at this time.

## 2013-12-13 ENCOUNTER — Other Ambulatory Visit: Payer: Self-pay

## 2013-12-13 DIAGNOSIS — K629 Disease of anus and rectum, unspecified: Secondary | ICD-10-CM

## 2013-12-13 NOTE — Telephone Encounter (Signed)
Pt aware to continue linzess, he will pick up flex instructions today

## 2013-12-13 NOTE — Telephone Encounter (Signed)
The pt has been scheduled and is aware of the appointment and instructions.  He was cleared to hold Eliquis 3 days prior to the appt per Dr Raliegh Ip.  Letter to be scanned into EPIC. Dr Ardis Hughs the pt says he has been using linzess 145 for 1 week with no results, should he continue until flex?

## 2013-12-13 NOTE — Telephone Encounter (Signed)
Yes, should  continue  

## 2013-12-15 ENCOUNTER — Other Ambulatory Visit: Payer: Self-pay | Admitting: *Deleted

## 2013-12-16 ENCOUNTER — Encounter (HOSPITAL_COMMUNITY): Payer: Self-pay | Admitting: *Deleted

## 2013-12-16 ENCOUNTER — Encounter (HOSPITAL_COMMUNITY)
Admission: RE | Disposition: A | Discharge status: Home / Self Care | Payer: Self-pay | Source: Ambulatory Visit | Attending: Gastroenterology

## 2013-12-16 ENCOUNTER — Ambulatory Visit (HOSPITAL_COMMUNITY)
Admission: RE | Admit: 2013-12-16 | Discharge: 2013-12-16 | Disposition: A | Discharge status: Home / Self Care | Payer: Medicare HMO | Source: Ambulatory Visit | Attending: Gastroenterology | Admitting: Gastroenterology

## 2013-12-16 DIAGNOSIS — Z951 Presence of aortocoronary bypass graft: Secondary | ICD-10-CM | POA: Insufficient documentation

## 2013-12-16 DIAGNOSIS — I420 Dilated cardiomyopathy: Secondary | ICD-10-CM | POA: Insufficient documentation

## 2013-12-16 DIAGNOSIS — I4891 Unspecified atrial fibrillation: Secondary | ICD-10-CM | POA: Insufficient documentation

## 2013-12-16 DIAGNOSIS — F17210 Nicotine dependence, cigarettes, uncomplicated: Secondary | ICD-10-CM | POA: Insufficient documentation

## 2013-12-16 DIAGNOSIS — Z980 Intestinal bypass and anastomosis status: Secondary | ICD-10-CM | POA: Insufficient documentation

## 2013-12-16 DIAGNOSIS — I426 Alcoholic cardiomyopathy: Secondary | ICD-10-CM | POA: Insufficient documentation

## 2013-12-16 DIAGNOSIS — I2510 Atherosclerotic heart disease of native coronary artery without angina pectoris: Secondary | ICD-10-CM | POA: Insufficient documentation

## 2013-12-16 DIAGNOSIS — Z7901 Long term (current) use of anticoagulants: Secondary | ICD-10-CM | POA: Insufficient documentation

## 2013-12-16 DIAGNOSIS — Z08 Encounter for follow-up examination after completed treatment for malignant neoplasm: Secondary | ICD-10-CM | POA: Diagnosis present

## 2013-12-16 DIAGNOSIS — F1010 Alcohol abuse, uncomplicated: Secondary | ICD-10-CM | POA: Diagnosis not present

## 2013-12-16 DIAGNOSIS — F329 Major depressive disorder, single episode, unspecified: Secondary | ICD-10-CM | POA: Diagnosis not present

## 2013-12-16 DIAGNOSIS — I5022 Chronic systolic (congestive) heart failure: Secondary | ICD-10-CM | POA: Diagnosis not present

## 2013-12-16 DIAGNOSIS — Z85038 Personal history of other malignant neoplasm of large intestine: Secondary | ICD-10-CM | POA: Diagnosis not present

## 2013-12-16 DIAGNOSIS — K629 Disease of anus and rectum, unspecified: Secondary | ICD-10-CM

## 2013-12-16 HISTORY — PX: FLEXIBLE SIGMOIDOSCOPY: SHX5431

## 2013-12-16 SURGERY — SIGMOIDOSCOPY, FLEXIBLE
Anesthesia: Moderate Sedation

## 2013-12-16 MED ORDER — SODIUM CHLORIDE 0.9 % IV SOLN
INTRAVENOUS | Status: DC
  Administered 2013-12-16: 500 mL via INTRAVENOUS

## 2013-12-16 MED ORDER — FENTANYL CITRATE 0.05 MG/ML IJ SOLN
INTRAMUSCULAR | Status: DC | PRN
  Administered 2013-12-16 (×2): 25 ug via INTRAVENOUS

## 2013-12-16 MED ORDER — DIPHENHYDRAMINE HCL 50 MG/ML IJ SOLN
INTRAMUSCULAR | Status: AC
  Filled 2013-12-16: qty 1

## 2013-12-16 MED ORDER — MIDAZOLAM HCL 10 MG/2ML IJ SOLN
INTRAMUSCULAR | Status: DC | PRN
  Administered 2013-12-16 (×2): 2 mg via INTRAVENOUS

## 2013-12-16 MED ORDER — FENTANYL CITRATE 0.05 MG/ML IJ SOLN
INTRAMUSCULAR | Status: AC
  Filled 2013-12-16: qty 2

## 2013-12-16 MED ORDER — MIDAZOLAM HCL 10 MG/2ML IJ SOLN
INTRAMUSCULAR | Status: AC
  Filled 2013-12-16: qty 2

## 2013-12-16 NOTE — H&P (Signed)
Patient ID: [name], male DOB: 29-Sep-1945, 68 y.o. MRN: [id number]  HPI [name] is a pleasant 68 year old white male known to Dr. Ardis Hughs. He has history of coronary artery disease is status post CABG in 2014 also with a dilated cardiomyopathy and EF of 25-30% and history of atrial fibrillation. He is maintained on Eliquis. He had undergone a sigmoid colectomy for colon cancer in May of 2014 done in [place]. Get a followup colonoscopy in December 2014 per Dr. Ardis Hughs which showed a normal anastomosis at 15 cm was noted to have medium internal and external hemorrhoids. It had problems with symptoms from his hemorrhoids and underwent hemorrhoidectomy in December of 2014 per Dr. Hassell Done.  Patient says he has recently been having problems with recurrent constipation and abdominal pains and discomfort. He says about a month ago he started noticing narrower caliber of stools which was very similar to when he was diagnosed with colon cancer and this has been very concerned. He says frequently have an urge for bowel movement but is unable to go to the bathroom. He is basically not having any bowel movements did tell he takes a laxative. He is generally taking 3 do collapse daily and did not fill the MiraLax was helpful because it did more quickly enough. He says he gets very uncomfortable distended feeling if he doesn't have a bowel movement about every day. His appetite has been good his weight has been stable.  Patient had undergone CT scan of the chest on 11/16/2013 per his PCP was noted to have patchy ill-defined bilateral infiltrates and shotty lymph nodes was felt this may be a multifocal pneumonia however could not rule out metastatic disease. He was treated with a course of doxycycline and has an appointment to be seen by pulmonary later today.  CT of the abdomen and pelvis October of 2014 had shown some circumferential thickening of the ascending colon 2 tiny low-density lesions in the  right lobe of the liver may be cysts but followup CT was recommended in 6 months.  Review of Systems  Constitutional: Negative.  HENT: Negative.  Eyes: Negative.  Respiratory: Negative.  Cardiovascular: Negative.  Gastrointestinal: Positive for abdominal pain, constipation and abdominal distention.  Endocrine: Negative.  Genitourinary: Negative.  Musculoskeletal: Positive for back pain.  Skin: Negative.  Allergic/Immunologic: Negative.  Neurological: Negative.  Hematological: Negative.  Psychiatric/Behavioral: Negative.   Outpatient Prescriptions Prior to Visit   Medication  Sig  Dispense  Refill   .  LORazepam (ATIVAN) 2 MG tablet  Take 1 tablet (2 mg total) by mouth at bedtime.  40 tablet  0   .  diltiazem (CARDIZEM CD) 120 MG 24 hr capsule  Take 1 capsule (120 mg total) by mouth daily with breakfast.  90 capsule  3   .  polyethylene glycol (MIRALAX / GLYCOLAX) packet  Take 250 g by mouth once.      No facility-administered medications prior to visit.    No Known Allergies  Patient Active Problem List    Diagnosis  Date Noted   .  Status post hemorrhoidectomy Dec 2014  03/10/2013   .  Hx of CABG  01/22/2013   .  Insomnia  09/30/2012   .  History of hepatitis B  09/09/2012   .  Colon cancer  07/28/2012   .  Thrombocytopenia  11/04/2011   .  Chest pain  11/04/2011   .  Chronic systolic heart failure  85/27/7824   .  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  ALCOHOL ABUSE  01/18/2009   .  TOBACCO ABUSE  01/18/2009   .  DEPRESSION  01/17/2009    History   Substance Use Topics   .  Smoking status:  Current Every Day Smoker -- 1.00 packs/day for 40 years     Types:  Cigarettes   .  Smokeless tobacco:  Never Used   .  Alcohol Use:  No      Comment: Last consumption of alcohol-3 months ago.    family history includes Asthma in his mother. There is no history of Heart disease.  Objective:   Physical Exam well-developed older white male in no acute distress,  pleasant blood pressure 130/84 pulse 84 height 6 foot 4 weight 214. HEENT ;nontraumatic normocephalic EOMI PERRLA sclera anicteric, Supple ;no JVD, Cardiovascular; regular rate and rhythm with S1-S2 Pulmonary; clear bilaterally, Abdomen; soft bowel sounds are present midline incisional scar no focal tenderness mild generalized tenderness no guarding or rebound no palpable mass or hepatosplenomegaly, Rectal; exam not done, Extremities; no clubbing cyanosis or edema skin warm and dry, Psych; mood and affect appropriate   Assessment & Plan:   #48 68 year old male with history of adenocarcinoma of the colon 2014 status post sigmoid colectomy, followup colonoscopy December 2014 with a normal-appearing anastomosis.  Patient now presenting with constipation and narrower caliber stools and abdominal discomfort x4-6 weeks  This may be functional constipation there was no evidence of stricturing of his anastomosis on colonoscopy, given abnormal recent CT of the chest raising possibility of metastatic disease feel he should have CT of the abdomen and pelvis to rule out recurrence of colon cancer intra-abdominal.  #2 chronic anticoagulation  #3 coronary artery disease status post CABG  #4 atrial fibrillation  #5 dilated cardiomyopathy  Plan; schedule for CT scan of the abdomen and pelvis with contrast  CEA  Add daily probiotic  As dulcolax has been effective he will continue to 2 dulcolax on a daily basis  If CT scan is completely negative we may give him a trial of Linzess  And he'll followup with myself or Dr. Ardis Hughs in one month

## 2013-12-16 NOTE — Discharge Instructions (Signed)
Flexible Sigmoidoscopy, Care After Refer to this sheet in the next few weeks. These instructions provide you with information on caring for yourself after your procedure. Your health care provider may also give you more specific instructions. Your treatment has been planned according to current medical practices, but problems sometimes occur. Call your health care provider if you have any problems or questions after your procedure. WHAT TO EXPECT AFTER THE PROCEDURE After your procedure, it is typical to have the following:   Abdominal cramps.  Bloating.  A small amount of rectal bleeding if you had a biopsy. HOME CARE INSTRUCTIONS  Only take over-the-counter or prescription medicines for pain, fever, or discomfort as directed by your health care provider.  Resume your normal diet and activities as directed by your health care provider. SEEK MEDICAL CARE IF:  You have abdominal pain or cramping that lasts longer than 1 hour after the procedure.  You continue to have small amounts of rectal bleeding after 24 hours.  You have nausea or vomiting.  You feel weak or dizzy. SEEK IMMEDIATE MEDICAL CARE IF:   You have a fever.  You pass large blood clots or see a large amount of blood in the toilet after having a bowel movement. This may also occur 10-14 days after the procedure. It is more likely if you had a biopsy.  You develop abdominal pain that is not relieved with medicine or your abdominal pain gets worse.  You have nausea or vomiting for more than 24 hours after the procedure.

## 2013-12-16 NOTE — Op Note (Signed)
[facility] [place], [address]   FLEX SIGMOIDOSCOPY PROCEDURE REPORT  PATIENT: [name], [name]  MR#: [id number] BIRTHDATE: 1945/04/30 , 25  yrs. old GENDER: male ENDOSCOPIST: Milus Banister, MD PROCEDURE DATE:  12/16/2013 PROCEDURE:   Sigmoidoscopy, diagnostic INDICATIONS:sigmoid colectomy for colon cancer in May of 2014 done in [facility].  Get a followup colonoscopy in December 2014 per Dr.  Ardis Hughs which showed a normal anastomosis at 15 cm was noted to have medium internal and external hemorrhoids. It had problems with symptoms from his hemorrhoids and underwent hemorrhoidectomy in December of 2014 per Dr.  Hassell Done.Marland Kitchen MEDICATIONS: Fentanyl 50 mcg IV and Versed 4 mg IV  DESCRIPTION OF PROCEDURE:    Physical exam was performed.  Informed consent was obtained from the patient after explaining the benefits, risks, and alternatives to procedure.  The patient was connected to monitor and placed in left lateral position. Continuous oxygen was provided by nasal cannula and IV medicine administered through an indwelling cannula.  After administration of sedation and rectal exam, the patients rectum was intubated and the [id number] ([id number])  colonoscope was advanced under direct visualization to the cecum.  The scope was removed slowly by carefully examining the color, texture, anatomy, and integrity mucosa on the way out.  The patient was recovered in endoscopy and discharged home in satisfactory condition.   COLON FINDINGS: The colo-colonic anastomosis at 15cm from anus was clearly located and was normal appearing (widely patent without any abnormal associated mucosa).  The remaining examination, to the splenic flexure, was normal as well. PREP QUALITY: The overall prep quality was good.  COMPLICATIONS: None  ENDOSCOPIC IMPRESSION: The colo-colonic anastomosis at 15cm from anus was clearly located and was normal appearing (widely  patent without any abnormal associated mucosa).  The remaining examination, to the splenic flexure, was normal as well  RECOMMENDATIONS: Please stop probiotics, please also try to refrain from laxatives. Instead, you need to try once daily fiber supplement with orange powdered Citrucel.  This may cause some bloating at first but that will usually resolve.  Over a week, work up to a heaping spoonfull. Call Dr.  Ardis Hughs' office in 3-4 weeks to report on your response.   _______________________________ eSigned:  Milus Banister, MD 12/16/2013 11:40 AM      The ICD and CPT codes recommended by this software are interpretations from the data that the clinical staff has captured with the software.  The verification of the translation of this report to the ICD and CPT codes and modifiers is the sole responsibility of the health care institution and practicing physician where this report was generated.  [employer]. will not be held responsible for the validity of the ICD and CPT codes included on this report.  AMA assumes no liability for data contained or not contained herein. CPT is a Designer, television/film set of the [employer].

## 2013-12-17 ENCOUNTER — Encounter (HOSPITAL_COMMUNITY): Payer: Self-pay | Admitting: Gastroenterology

## 2013-12-20 ENCOUNTER — Ambulatory Visit: Payer: Medicare HMO | Admitting: Physician Assistant

## 2014-01-07 ENCOUNTER — Ambulatory Visit (INDEPENDENT_AMBULATORY_CARE_PROVIDER_SITE_OTHER): Payer: Commercial Managed Care - HMO | Admitting: Nurse Practitioner

## 2014-01-07 ENCOUNTER — Encounter: Payer: Self-pay | Admitting: Nurse Practitioner

## 2014-01-07 VITALS — BP 110/72 | HR 69 | Ht 77.0 in | Wt 217.2 lb

## 2014-01-07 DIAGNOSIS — K5909 Other constipation: Secondary | ICD-10-CM

## 2014-01-07 NOTE — Progress Notes (Signed)
     History of Present Illness:  Patient is 68 year old male known to Dr. Ardis Hughs. He has a history of colon cancer 2014, status post sigmoid colectomy. Follow-up colonoscopy 2014 revealed a normal appearing anastomosis. Patient was seen in the office late last month for constipation and change in stool caliber as well as abdominal discomfort. CT scan was negative except for possible proctitis. Patient was started on Linzess and scheduled for a flexible sigmoidoscopy for evaluation of CT findings. Sigmoidoscopy showed a normal-appearing anastomosis, the remaining exam was normal to the splenic flexure. Fiber supplements were added. Patient was advised to avoid laxatives and probiotics were discontinued. He was asked to follow-up with [place] in 3-4 weeks.  Per our recommendations patient has been taking daily citrucel.  Patient is having a bowel movement every day but still having to strain excessively. Stools are hard.   Current Medications, Allergies, Past Medical History, Past Surgical History, Family History and Social History were reviewed in [employer] record.  Physical Exam: General: Pleasant, well developed , white male in no acute distress Head: Normocephalic and atraumatic Eyes:  sclerae anicteric, conjunctiva pink  Ears: Normal auditory acuity Lungs: Clear throughout to auscultation Heart: Regular rate and rhythm Abdomen: Soft, non distended, non-tender. No masses, no hepatomegaly. Normal bowel sounds Musculoskeletal: Symmetrical with no gross deformities  Extremities: No edema  Neurological: Alert oriented x 4, grossly nonfocal Psychological:  Alert and cooperative. Normal mood and affect  Assessment and Recommendations:  68 year old male with history of colon cancer 2014, status post sigmoid colectomy.. Earlier this month he had a sigmoidoscopy for evaluation of bowel changes and ? Proctitis seen on CTscan. Sigmoidoscopy was normal, anastomosis was wide open.  Patient now having daily BM on Citrucel but stools still hard and he has to strain. Will add daily stool softener. I advised glycerin suppositories as needed to help facilitate passage of stool. Patient will let us know how these bowel regimen additions work for him

## 2014-01-07 NOTE — Patient Instructions (Signed)
Continue the Citrucel.  Add Colace- stool softner. You need to keep the stools soft to avoid straining. You may use Glycerin suppositories as needed.  If you continue to have constipation call us back at [phone]. Ask for a nurse.

## 2014-01-10 ENCOUNTER — Ambulatory Visit (INDEPENDENT_AMBULATORY_CARE_PROVIDER_SITE_OTHER): Payer: Medicare HMO | Admitting: Emergency Medicine

## 2014-01-10 ENCOUNTER — Encounter: Payer: Self-pay | Admitting: Emergency Medicine

## 2014-01-10 ENCOUNTER — Encounter: Payer: Self-pay | Admitting: Nurse Practitioner

## 2014-01-10 DIAGNOSIS — K5900 Constipation, unspecified: Secondary | ICD-10-CM | POA: Insufficient documentation

## 2014-01-10 DIAGNOSIS — R911 Solitary pulmonary nodule: Secondary | ICD-10-CM

## 2014-01-10 NOTE — Progress Notes (Signed)
i agree with the above note, plan 

## 2014-01-10 NOTE — Progress Notes (Signed)
Subjective:    Patient ID: [name], male    DOB: Jan 10, 1946, 68 y.o.   MRN: [id number]  HPI 68 yo active smoker (62 pk-yrs), hx of CAD/CABG, cardiomyopathy, colon CA > treated surgically in 2014 ([place]). He was seen in the ED 11/06/13 with a CXR that showed a LUL nodule. Subsequent CT chest done 9/8 that showed a LUL nodule and some scattered small nodules. He was treated for CAP, but acknowledges that he did not have cough, fever, sputum, etc. He continues to have diarrhea and abd pain.   ROV 01/10/14 -- follow up for tobacco use, LUL nodule with some scattered surrounding smaller nodules. He has been feeling well. Denies any dyspnea, cough or constitutional symptoms. He is here to review his CT scan that was done October 2   Review of Systems  Constitutional: Negative for fever, chills, activity change, appetite change and unexpected weight change.  HENT: Negative for congestion, dental problem, postnasal drip, rhinorrhea, sneezing, sore throat, trouble swallowing and voice change.   Eyes: Negative for visual disturbance.  Respiratory: Negative for cough, choking and shortness of breath.   Cardiovascular: Negative for chest pain and leg swelling.  Gastrointestinal: Positive for abdominal pain. Negative for nausea and vomiting.  Genitourinary: Negative for difficulty urinating.  Musculoskeletal: Negative for arthralgias.  Skin: Negative for rash.  Psychiatric/Behavioral: Negative for behavioral problems and confusion.      Objective:   Physical Exam Filed Vitals:   01/10/14 1551  BP: 132/82  Pulse: 67  Temp: 97.8 F (36.6 C)  TempSrc: Oral  Height: 6\' 4"  (1.93 m)  Weight: 220 lb 9.6 oz (100.064 kg)  SpO2: 97%   Gen: Pleasant, well-nourished, in no distress,  normal affect  ENT: No lesions,  mouth clear,  oropharynx clear, no postnasal drip  Neck: No JVD, no TMG, no carotid bruits  Lungs: No use of accessory muscles, clear without rales or  rhonchi  Cardiovascular: RRR, heart sounds normal, no murmur or gallops, no peripheral edema  Musculoskeletal: No deformities, no cyanosis or clubbing  Neuro: alert, non focal  Skin: Warm, no lesions or rashes   12/10/13 --  COMPARISON: 11/16/2013  FINDINGS: The irregular left upper lobe pulmonary parenchymal nodule measures 1.9 x 1.1 cm image 12, which is not numerically significantly changed since the previous measurements although the nodule itself appears subjectively less bulky than previously. Multiple bilateral diffuse irregular areas of patchy airspace opacity are reidentified elsewhere, all measuring smaller than the dominant left upper lobe nodule, and some of which appear subjectively slightly smaller. Diffuse emphysematous changes are reidentified.  No pleural or pericardial effusion. Evidence of CABG reidentified. Heart size is normal. Incomplete imaging of the upper abdomen demonstrates hepatic hypodense lesions 5 mm and smaller, likely cysts or biliary hamartomas. Incompletely imaged adrenal glands are normal. No lymphadenopathy. Great vessels are normal in caliber.  Degenerative change noted in the spine.  IMPRESSION: Minimal subjective decrease in size of dominant left upper lobe irregular pulmonary nodule, with persistent visualization of small multi lobar patchy airspace opacities in an overall pattern most typical for infection. However, bronchogenic carcinoma may appear similar and can appear to regress slightly at followup exams despite malignancy. Because some of the patchy airspace opacities appear to demonstrate central lucency, fungal infection, emboli, or metastasis could appear similar but are felt less likely.     Assessment & Plan:   Pulmonary nodule, left Upper lobe pulmonary nodule has actually changed in appearance. It is less opacified, skinnier  and slightly smaller than the previous scan from September. Given this change I believe  it would be reasonable to repeat his scan in 6 months to look for interval change.. If he changes or worsens in anyway then we will repeat the scan sooner.

## 2014-01-10 NOTE — Patient Instructions (Signed)
We will plan to repeat your CT scan of the chest in March 2016 If you develop any changes in your breathing, cough, mucus production then call our office. Under such circumstances we may decide to repeat your CT scan sooner. Follow with Dr Lamonte Sakai in 6 months or sooner if you have any problems

## 2014-01-10 NOTE — Assessment & Plan Note (Signed)
Upper lobe pulmonary nodule has actually changed in appearance. It is less opacified, skinnier and slightly smaller than the previous scan from September. Given this change I believe it would be reasonable to repeat his scan in 6 months to look for interval change.. If he changes or worsens in anyway then we will repeat the scan sooner.

## 2014-02-10 DIAGNOSIS — I340 Nonrheumatic mitral (valve) insufficiency: Secondary | ICD-10-CM | POA: Insufficient documentation

## 2014-02-10 DIAGNOSIS — I4819 Other persistent atrial fibrillation: Secondary | ICD-10-CM | POA: Insufficient documentation

## 2014-02-10 DIAGNOSIS — I2510 Atherosclerotic heart disease of native coronary artery without angina pectoris: Secondary | ICD-10-CM | POA: Insufficient documentation

## 2014-05-07 ENCOUNTER — Emergency Department (HOSPITAL_COMMUNITY)
Admission: EM | Admit: 2014-05-07 | Discharge: 2014-05-07 | Disposition: A | Discharge status: Home / Self Care | Payer: Medicare HMO | Attending: Emergency Medicine | Admitting: Emergency Medicine

## 2014-05-07 ENCOUNTER — Emergency Department (HOSPITAL_COMMUNITY): Payer: Medicare HMO

## 2014-05-07 ENCOUNTER — Encounter (HOSPITAL_COMMUNITY): Payer: Self-pay | Admitting: Emergency Medicine

## 2014-05-07 DIAGNOSIS — Z7902 Long term (current) use of antithrombotics/antiplatelets: Secondary | ICD-10-CM | POA: Insufficient documentation

## 2014-05-07 DIAGNOSIS — G4700 Insomnia, unspecified: Secondary | ICD-10-CM | POA: Diagnosis not present

## 2014-05-07 DIAGNOSIS — Z720 Tobacco use: Secondary | ICD-10-CM | POA: Diagnosis not present

## 2014-05-07 DIAGNOSIS — R011 Cardiac murmur, unspecified: Secondary | ICD-10-CM | POA: Diagnosis not present

## 2014-05-07 DIAGNOSIS — I4891 Unspecified atrial fibrillation: Secondary | ICD-10-CM | POA: Diagnosis not present

## 2014-05-07 DIAGNOSIS — R0789 Other chest pain: Secondary | ICD-10-CM | POA: Diagnosis not present

## 2014-05-07 DIAGNOSIS — R079 Chest pain, unspecified: Secondary | ICD-10-CM | POA: Diagnosis present

## 2014-05-07 DIAGNOSIS — I2510 Atherosclerotic heart disease of native coronary artery without angina pectoris: Secondary | ICD-10-CM | POA: Insufficient documentation

## 2014-05-07 DIAGNOSIS — Z85038 Personal history of other malignant neoplasm of large intestine: Secondary | ICD-10-CM | POA: Insufficient documentation

## 2014-05-07 DIAGNOSIS — Z9889 Other specified postprocedural states: Secondary | ICD-10-CM | POA: Diagnosis not present

## 2014-05-07 DIAGNOSIS — Z8619 Personal history of other infectious and parasitic diseases: Secondary | ICD-10-CM | POA: Insufficient documentation

## 2014-05-07 DIAGNOSIS — Z8719 Personal history of other diseases of the digestive system: Secondary | ICD-10-CM | POA: Insufficient documentation

## 2014-05-07 DIAGNOSIS — Z951 Presence of aortocoronary bypass graft: Secondary | ICD-10-CM | POA: Diagnosis not present

## 2014-05-07 DIAGNOSIS — Z79899 Other long term (current) drug therapy: Secondary | ICD-10-CM | POA: Diagnosis not present

## 2014-05-07 LAB — BASIC METABOLIC PANEL
Anion gap: 6 (ref 5–15)
BUN: 16 mg/dL (ref 6–23)
CO2: 25 mmol/L (ref 19–32)
Calcium: 9 mg/dL (ref 8.4–10.5)
Chloride: 107 mmol/L (ref 96–112)
Creatinine, Ser: 0.99 mg/dL (ref 0.50–1.35)
GFR calc Af Amer: >90 mL/min (ref >90)
GFR calc non Af Amer: 83 mL/min — ABNORMAL LOW (ref >90)
Glucose, Bld: 75 mg/dL (ref 70–99)
Potassium: 3.9 mmol/L (ref 3.5–5.1)
SODIUM: 138 mmol/L (ref 135–145)

## 2014-05-07 LAB — I-STAT TROPONIN, ED: TROPONIN I, POC: 0 ng/mL (ref 0.00–0.08)

## 2014-05-07 LAB — CBC
HEMATOCRIT: 44.4 % (ref 39.0–52.0)
HEMOGLOBIN: 15.1 g/dL (ref 13.0–17.0)
MCH: 31.5 pg (ref 26.0–34.0)
MCHC: 34 g/dL (ref 30.0–36.0)
MCV: 92.7 fL (ref 78.0–100.0)
PLATELETS: 172 10*3/uL (ref 150–400)
RBC: 4.79 MIL/uL (ref 4.22–5.81)
RDW: 13.5 % (ref 11.5–15.5)
WBC: 8.1 10*3/uL (ref 4.0–10.5)

## 2014-05-07 NOTE — Discharge Instructions (Signed)
Chest Wall Pain Take Tylenol as directed for pain. Ask your primary care physician or cardiologist or any of your doctors to help you to stop smoking Chest wall pain is pain felt in or around the chest bones and muscles. It may take up to 6 weeks to get better. It may take longer if you are active. Chest wall pain can happen on its own. Other times, things like germs, injury, coughing, or exercise can cause the pain. HOME CARE   Avoid activities that make you tired or cause pain. Try not to use your chest, belly (abdominal), or side muscles. Do not use heavy weights.  Put ice on the sore area.  Put ice in a plastic bag.  Place a towel between your skin and the bag.  Leave the ice on for 15-20 minutes for the first 2 days.  Only take medicine as told by your doctor. GET HELP RIGHT AWAY IF:   You have more pain or are very uncomfortable.  You have a fever.  Your chest pain gets worse.  You have new problems.  You feel sick to your stomach (nauseous) or throw up (vomit).  You start to sweat or feel lightheaded.  You have a cough with mucus (phlegm).  You cough up blood. MAKE SURE YOU:   Understand these instructions.  Will watch your condition.  Will get help right away if you are not doing well or get worse. Document Released: 08/14/2007 Document Revised: 05/20/2011 Document Reviewed: 10/22/2010 [facility] Patient Information 2015 [place], [place]. This information is not intended to replace advice given to you by your health care provider. Make sure you discuss any questions you have with your health care provider.

## 2014-05-07 NOTE — ED Notes (Signed)
Bed: EZ74 Expected date:  Expected time:  Means of arrival:  Comments: CP triage

## 2014-05-07 NOTE — ED Notes (Signed)
Pt states that he has been having rt sided CP x 2 days that has been getting increasingly worse.  Denies SOB.  Hx of triple bypass in 2013.

## 2014-05-07 NOTE — ED Provider Notes (Signed)
CSN: [id number]     Arrival date & time 05/07/14  1228 History   First MD Initiated Contact with Patient 05/07/14 1405     Chief Complaint  Patient presents with  . Chest Pain     (Consider location/radiation/quality/duration/timing/severity/associated sxs/prior Treatment) HPI complains of right-sided anterior chest pain onset 3 days ago. Pain is constant worse with changing positions or moving his right arm. Improved with remaining still. Pain is not worse with exertion. He denies shortness of breath denies nausea or vomiting denies other associated symptoms. No treatment prior to coming here no other associated symptoms. Does not feel like "heart pain" he's had in the past  Past Medical History  Diagnosis Date  . Atrial fibrillation     diagnosed 2010  . 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 03/21/2011 - 2D Echo: EF 25% to 30%. Diffuse hypokinesis.  Marland Kitchen HEMORRHOIDS 01/17/2009  . ALCOHOL ABUSE 01/18/2009  . Heart murmur   . Insomnia     TAKES ATIVAN TO SLEEP  . Colon cancer   . Hepatitis     hepatitis B-never treated-Titers were low  . Coronary artery disease     LAST SEEN BY CARIOLOGIST IN [facility] 04/2012   Past Surgical History  Procedure Laterality Date  . Mouth surgery    . Cardiac catheterization    . Colonoscopy N/A 02/11/2013    Procedure: COLONOSCOPY;  Surgeon: Milus Banister, MD;  Location: [facility];  Service: Endoscopy;  Laterality: N/A;  . Colon surgery  07/2012    COLON CANCER  . Coronary artery bypass graft      triple  bypass OCT 2013 in [place]  . Hemorrhoid surgery N/A 03/09/2013    Procedure: HEMORRHOIDECTOMY;  Surgeon: Pedro Earls, MD;  Location: [facility];  Service: General;  Laterality: N/A;  . Flexible sigmoidoscopy N/A 12/16/2013    Procedure: Beryle Quant;  Surgeon: Milus Banister, MD;  Location: [facility];  Service: Endoscopy;  Laterality: N/A;   Family History  Problem Relation Age of Onset  . Heart disease  Neg Hx   . Asthma Mother   . Colon cancer Sister 57   History  Substance Use Topics  . Smoking status: Current Every Day Smoker -- 1.00 packs/day for 50 years    Types: Cigarettes  . Smokeless tobacco: Never Used     Comment: tobacco info given 01/07/14  . Alcohol Use: No     Comment: 2 drinks per month    Review of Systems  Constitutional: Negative.   HENT: Negative.   Respiratory: Negative.   Cardiovascular: Positive for chest pain.  Gastrointestinal: Negative.   Musculoskeletal: Negative.   Skin: Negative.   Neurological: Negative.   Psychiatric/Behavioral: Negative.   All other systems reviewed and are negative.     Allergies  Review of patient's allergies indicates no known allergies.  Home Medications   Prior to Admission medications   Medication Sig Start Date End Date Taking? Authorizing Provider  amiodarone (PACERONE) 100 MG tablet Take 100 mg by mouth daily.   Yes Historical Provider, MD  apixaban (ELIQUIS) 5 MG TABS tablet Take 5 mg by mouth 2 (two) times daily.   Yes Historical Provider, MD  diltiazem (CARDIZEM CD) 120 MG 24 hr capsule Take 240 mg by mouth daily with breakfast.   Yes Robbie Lis, MD  LORazepam (ATIVAN) 2 MG tablet Take 1 tablet (2 mg total) by mouth at bedtime. 04/15/13  Yes Doran Heater, MD  BP 114/67 mmHg  Pulse 72  Temp(Src) 97.7 F (36.5 C) (Oral)  Resp 14  SpO2 100% Physical Exam  Constitutional: He appears well-developed and well-nourished.  HENT:  Head: Normocephalic and atraumatic.  Eyes: Conjunctivae are normal. Pupils are equal, round, and reactive to light.  Neck: Neck supple. No tracheal deviation present. No thyromegaly present.  Cardiovascular: Normal rate and regular rhythm.   No murmur heard. Pulmonary/Chest: Effort normal and breath sounds normal. He exhibits tenderness.  Pain at right anterior chest is easily reproduced by forcible abduction of left shoulder  Abdominal: Soft. Bowel sounds are normal. He  exhibits no distension. There is no tenderness.  Musculoskeletal: Normal range of motion. He exhibits no edema or tenderness.  Neurological: He is alert. Coordination normal.  Skin: Skin is warm and dry. No rash noted.  Psychiatric: He has a normal mood and affect.  Nursing note and vitals reviewed.   ED Course  Procedures (including critical care time) Labs Review Labs Reviewed  BASIC METABOLIC PANEL - Abnormal; Notable for the following:    GFR calc non Af Amer 83 (*)    All other components within normal limits  CBC  I-STAT TROPOININ, ED    Imaging Review No results found.   EKG Interpretation   Date/Time:  Saturday May 07 2014 12:36:00 EST Ventricular Rate:  69 PR Interval:  181 QRS Duration: 101 QT Interval:  412 QTC Calculation: 441 R Axis:   70 Text Interpretation:  Sinus rhythm Low voltage, extremity leads RSR' in V1  or V2, probably normal variant Nonspecific T abnormalities, lateral leads  Baseline wander in lead(s) aVF Since last tracing rate slower Confirmed by  Winfred Leeds  MD, Enyla Lisbon [id number]) on 05/07/2014 3:31:41 PM     Chest xray viewed by me.  Patient is aware of lung nodule and has follow-up arranged in March 2016 for further evaluation Results for orders placed or performed during the hospital encounter of 05/07/14  CBC  Result Value Ref Range   WBC 8.1 4.0 - 10.5 K/uL   RBC 4.79 4.22 - 5.81 MIL/uL   Hemoglobin 15.1 13.0 - 17.0 g/dL   HCT 44.4 39.0 - 52.0 %   MCV 92.7 78.0 - 100.0 fL   MCH 31.5 26.0 - 34.0 pg   MCHC 34.0 30.0 - 36.0 g/dL   RDW 13.5 11.5 - 15.5 %   Platelets 172 150 - 400 K/uL  Basic metabolic panel  Result Value Ref Range   Sodium 138 135 - 145 mmol/L   Potassium 3.9 3.5 - 5.1 mmol/L   Chloride 107 96 - 112 mmol/L   CO2 25 19 - 32 mmol/L   Glucose, Bld 75 70 - 99 mg/dL   BUN 16 6 - 23 mg/dL   Creatinine, Ser 0.99 0.50 - 1.35 mg/dL   Calcium 9.0 8.4 - 10.5 mg/dL   GFR calc non Af Amer 83 (L) >90 mL/min   GFR calc Af Amer  >90 >90 mL/min   Anion gap 6 5 - 15  I-stat troponin, ED (not at [facility])  Result Value Ref Range   Troponin i, poc 0.00 0.00 - 0.08 ng/mL   Comment 3           Dg Chest 2 View  05/07/2014   CLINICAL DATA:  Right upper chest pain for 3 days.  EXAM: CHEST  2 VIEW  COMPARISON:  11/06/2013  FINDINGS: Prior CABG. Heart is upper limits normal in size. Nodular opacity previously seen projecting over the left  upper lobe has decreased and cons acuity. No new nodular areas or confluent airspace opacities. No effusions. No acute bony abnormality.  IMPRESSION: Decreasing size and conspicuity of the left upper lobe nodule previously seen.  No acute cardiopulmonary disease.   Electronically Signed   By: Rolm Baptise M.D.   On: 05/07/2014 14:45    MDM  Strongly doubt acute coronary syndrome. Negative troponin after 3 days. Highly atypical symptoms. Exam is most consistent with chest wall pain  Plan Tylenol for pain.  I counciled pt for 5 minutes on smoking cessation Final diagnoses:  None  Diagnosis #1chest wall pain #2 tobacco abuse #3 lung nodule     Orlie Dakin, MD 05/07/14 1536

## 2014-05-17 ENCOUNTER — Ambulatory Visit (INDEPENDENT_AMBULATORY_CARE_PROVIDER_SITE_OTHER)
Admission: RE | Admit: 2014-05-17 | Discharge: 2014-05-17 | Disposition: A | Discharge status: Home / Self Care | Payer: Medicare HMO | Source: Ambulatory Visit | Attending: Emergency Medicine | Admitting: Emergency Medicine

## 2014-05-17 DIAGNOSIS — R911 Solitary pulmonary nodule: Secondary | ICD-10-CM

## 2014-05-18 ENCOUNTER — Telehealth: Payer: Self-pay | Admitting: Emergency Medicine

## 2014-05-18 NOTE — Telephone Encounter (Signed)
Pt would like results of his CT done yesterday.  RB - please advise. Thanks.

## 2014-05-18 NOTE — Telephone Encounter (Signed)
Please let the pt know that his CT scan has improved > All of the nodules, especially the largest one on the left, have shrunk in size, consistent with a resolving infection and scar.

## 2014-05-19 NOTE — Telephone Encounter (Signed)
lmtcb x1 

## 2014-05-19 NOTE — Telephone Encounter (Signed)
[phone] returning call

## 2014-05-19 NOTE — Telephone Encounter (Signed)
Spoke with pt and advised of ct results per Dr Lamonte Sakai.

## 2014-07-21 IMAGING — CR DG CHEST 1V PORT
1 series · 2 of 2 positions shown · non-contrast
Comparison: none

REASON FOR EXAM: Chest Pain
COMMENTS:

PROCEDURE:     DXR - DXR PORTABLE CHEST SINGLE VIEW  - October 25, 2011  [DATE]
RESULT:     The lungs are mildly hyperinflated but clear. The cardiac
silhouette is top normal in size. The pulmonary vascularity is not engorged.
There is no pleural effusion. The mediastinum is normal in width.

[Series 1: portable · 0.17mm/px · 2 of 2 slices shown]
[im 1/2]
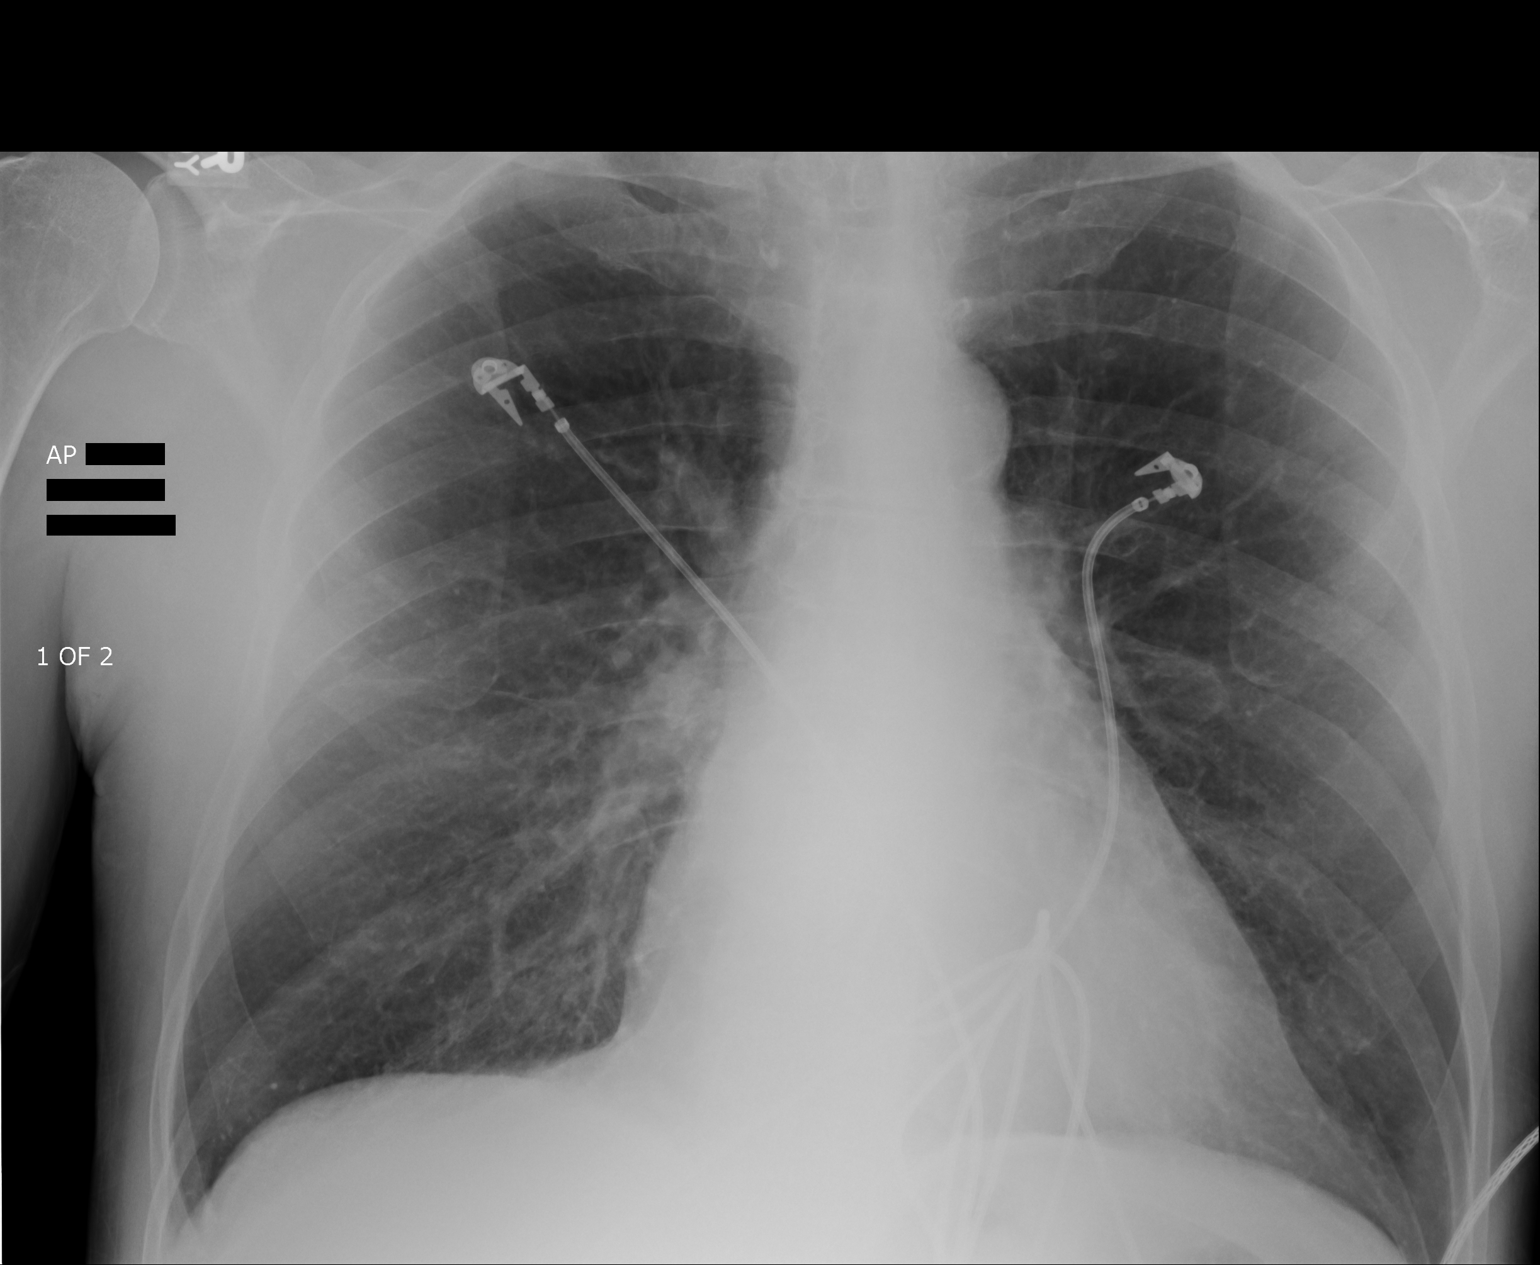
[im 2/2]
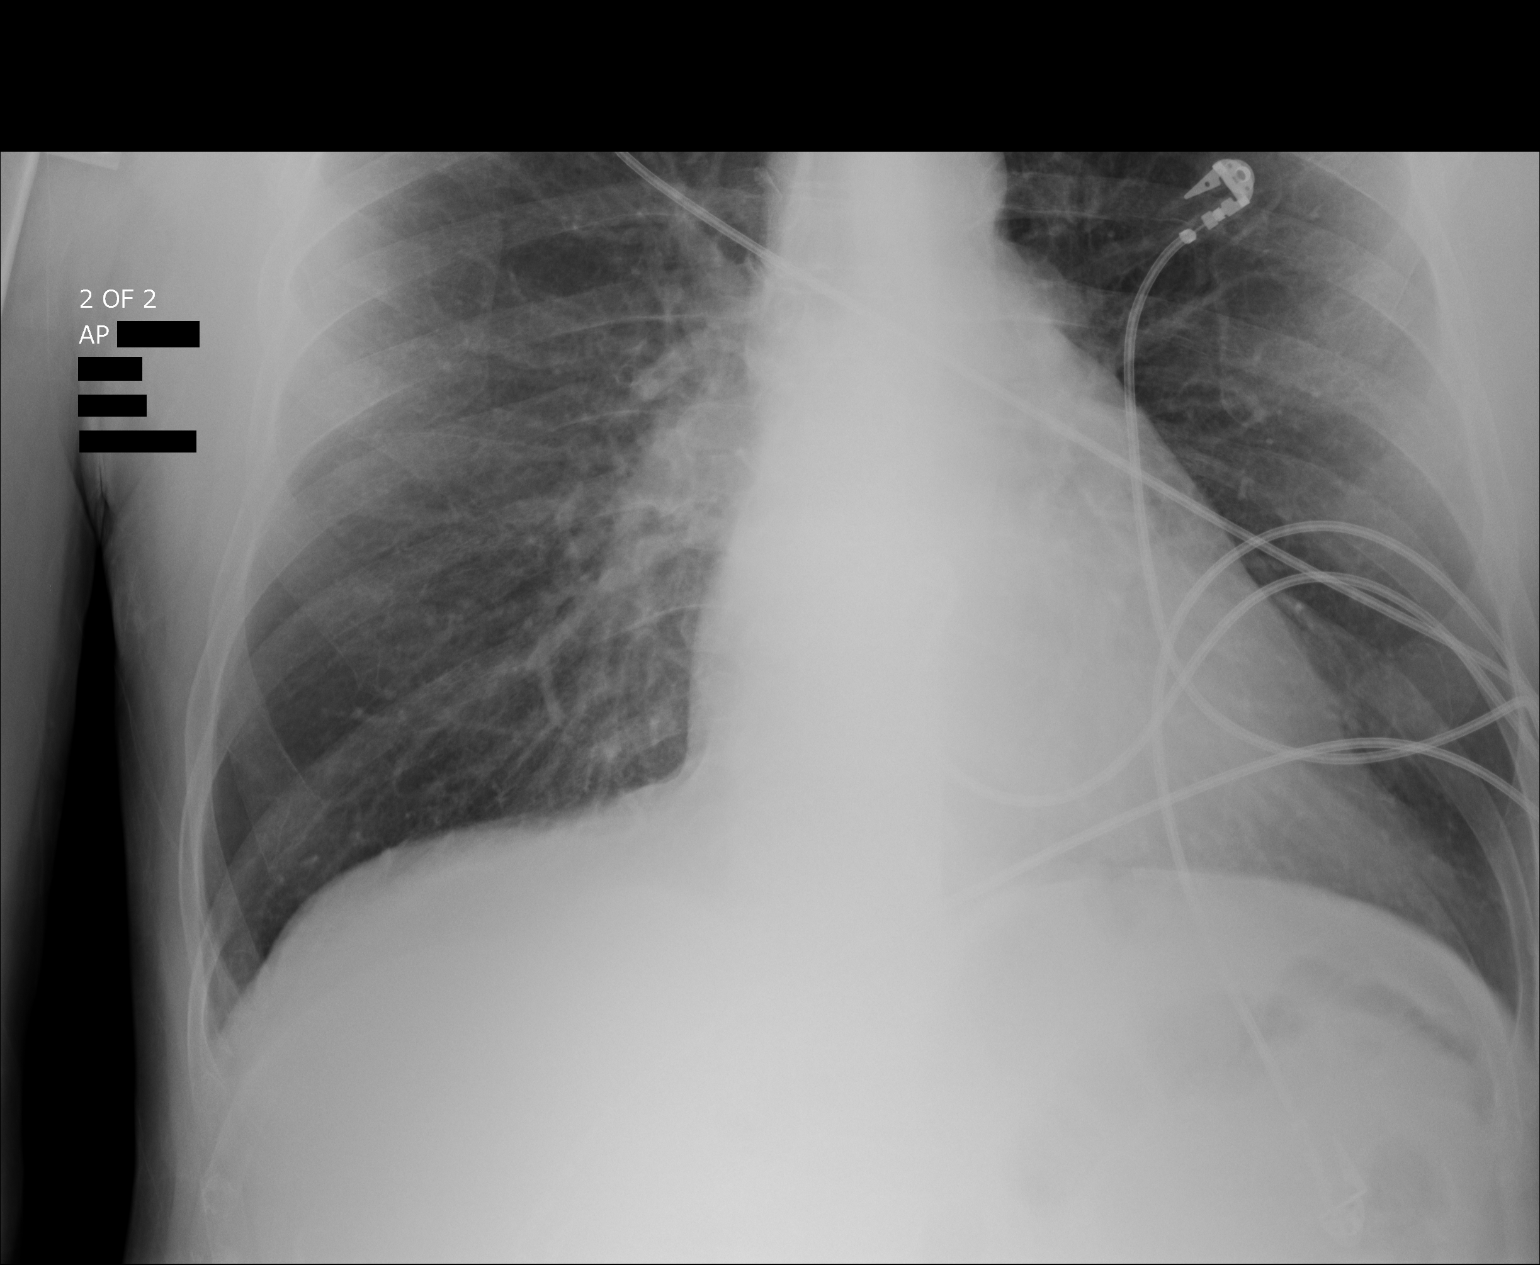

[2 of 2 positions shown; findings below may reference images not displayed]

IMPRESSION: There is mild hyperinflation which may be voluntary or
could reflect underlying COPD or reactive airway disease. There is no
evidence of CHF nor of pneumonia.

[REDACTED]

## 2014-07-22 DIAGNOSIS — I10 Essential (primary) hypertension: Secondary | ICD-10-CM | POA: Insufficient documentation

## 2014-07-28 DIAGNOSIS — I071 Rheumatic tricuspid insufficiency: Secondary | ICD-10-CM | POA: Insufficient documentation

## 2014-08-09 ENCOUNTER — Other Ambulatory Visit: Payer: Self-pay | Admitting: *Deleted

## 2014-08-09 ENCOUNTER — Encounter: Payer: Self-pay | Admitting: *Deleted

## 2014-08-11 ENCOUNTER — Institutional Professional Consult (permissible substitution): Payer: Medicare HMO | Admitting: Internal Medicine

## 2014-08-12 ENCOUNTER — Encounter: Payer: Self-pay | Admitting: Internal Medicine

## 2014-08-12 ENCOUNTER — Ambulatory Visit (INDEPENDENT_AMBULATORY_CARE_PROVIDER_SITE_OTHER): Payer: Medicare HMO | Admitting: Internal Medicine

## 2014-08-12 VITALS — BP 122/82 | HR 124 | Ht 77.0 in | Wt 220.2 lb

## 2014-08-12 DIAGNOSIS — F1010 Alcohol abuse, uncomplicated: Secondary | ICD-10-CM

## 2014-08-12 DIAGNOSIS — I481 Persistent atrial fibrillation: Secondary | ICD-10-CM | POA: Diagnosis not present

## 2014-08-12 DIAGNOSIS — Z720 Tobacco use: Secondary | ICD-10-CM

## 2014-08-12 DIAGNOSIS — F17200 Nicotine dependence, unspecified, uncomplicated: Secondary | ICD-10-CM

## 2014-08-12 DIAGNOSIS — I5022 Chronic systolic (congestive) heart failure: Secondary | ICD-10-CM | POA: Diagnosis not present

## 2014-08-12 DIAGNOSIS — I426 Alcoholic cardiomyopathy: Secondary | ICD-10-CM | POA: Diagnosis not present

## 2014-08-12 DIAGNOSIS — I480 Paroxysmal atrial fibrillation: Secondary | ICD-10-CM | POA: Diagnosis not present

## 2014-08-12 DIAGNOSIS — I4819 Other persistent atrial fibrillation: Secondary | ICD-10-CM

## 2014-08-12 DIAGNOSIS — I10 Essential (primary) hypertension: Secondary | ICD-10-CM

## 2014-08-12 MED ORDER — METOPROLOL SUCCINATE ER 50 MG PO TB24: 50.0000 mg | ORAL_TABLET | Freq: Every day | ORAL | Status: DC

## 2014-08-12 MED ORDER — DILTIAZEM HCL ER COATED BEADS 240 MG PO CP24: 240.0000 mg | ORAL_CAPSULE | Freq: Every day | ORAL | Status: DC

## 2014-08-12 MED ORDER — AMIODARONE HCL 200 MG PO TABS: ORAL_TABLET | ORAL | Status: AC

## 2014-08-12 NOTE — Patient Instructions (Signed)
Medication Instructions: - Increase diltiazem to 240 mg once daily - Start amiodarone 200 mg one tablet by mouth twice daily - Increase metoprolol succinate to 50 mg once daily  Labwork: - none  Procedures/Testing: - Dr. Alveria Apley office is going to call you to schedule your echocardiogram  Follow-Up: - Your physician recommends that you schedule a follow-up appointment in: 2 weeks with Roderic Palau, NP (a-fib clinic)   Any Additional Special Instructions Will Be Listed Below (If Applicable). - none

## 2014-08-12 NOTE — Progress Notes (Signed)
Electrophysiology Office Note   Date:  08/12/2014   ID:  CHAISE Archer, DOB Jan 10, 1946, MRN [id number]  PCP:  Andria Frames, MD  Cardiologist:  Dr Gigi Gin Primary Electrophysiologist: Thompson Grayer, MD    Chief Complaint  Patient presents with  . PAF     History of Present Illness: [name] is a 69 y.o. male who presents today for electrophysiology evaluation.   He presents for EP consultation regarding his atrial fibrillation.  He reports initially being diagnosed with atrial fibrillation and a nonischemic CM in 2009.  He has a h/o heavy ETOh abuse which is felt to contribute.  He has been cardioverted 4 or 5 times, most recently 01/2013.  He was placed on amiodarone and did well until earlier this year.  He was able to stay in sinus rhythm with amiodarone 100mg  daily.  He reports better exercise tolerance and energy in sinus rhythm.  He also has palpitations with his afib.  He was taken off of amiodarone about 6 weeks ago due to concerns of toxicity.  He went back into atrial fibrillation about 3 weeks ago.  Since that time, his energy has been poor.  This is worse with tachypalpitations.  He reports compliance with anticoagulation without interruption. He has not drank any heavy ETOH x 2 years, up until last night.  His ex wife reports that he drank heavily last night and he has multiple bruises/ scrapes from a large fall that he had last night.  Today, he denies symptoms of chest pain, s orthopnea, PND, lower extremity edema, claudication, dizziness, presyncope, syncope, bleeding, or neurologic sequela. The patient is tolerating medications without difficulties and is otherwise without complaint today.    Past Medical History  Diagnosis Date  . Persistent atrial fibrillation     diagnosed 2010  . 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 03/21/2011 - 2D Echo: EF 25% to 30%. Diffuse hypokinesis.  Marland Kitchen HEMORRHOIDS 01/17/2009  . ALCOHOL ABUSE 01/18/2009  . Insomnia    TAKES ATIVAN TO SLEEP  . Colon cancer   . Hepatitis     hepatitis B-never treated-Titers were low  . Coronary artery disease     s/p CABG  . HTN (hypertension)   . HLD (hyperlipidemia)   . Depression   . Anxiety   . H/O alcohol abuse    Past Surgical History  Procedure Laterality Date  . Mouth surgery    . Cardiac catheterization    . Colonoscopy N/A 02/11/2013    Procedure: COLONOSCOPY;  Surgeon: Milus Banister, MD;  Location: [facility];  Service: Endoscopy;  Laterality: N/A;  . Colon surgery  07/2012    COLON CANCER  . Coronary artery bypass graft      triple  bypass OCT 2013 in [place]  . Hemorrhoid surgery N/A 03/09/2013    Procedure: HEMORRHOIDECTOMY;  Surgeon: Pedro Earls, MD;  Location: [facility];  Service: General;  Laterality: N/A;  . Flexible sigmoidoscopy N/A 12/16/2013    Procedure: Beryle Quant;  Surgeon: Milus Banister, MD;  Location: [facility];  Service: Endoscopy;  Laterality: N/A;     Current Outpatient Prescriptions  Medication Sig Dispense Refill  . apixaban (ELIQUIS) 5 MG TABS tablet Take 5 mg by mouth 2 (two) times daily.    Marland Kitchen diltiazem (CARDIZEM CD) 120 MG 24 hr capsule Take 120 mg by mouth daily with breakfast.     . LORazepam (ATIVAN) 2 MG tablet Take 1 tablet (2 mg  total) by mouth at bedtime. 40 tablet 0  . metoprolol succinate (TOPROL-XL) 25 MG 24 hr tablet Take 1 tablet by mouth daily.     No current facility-administered medications for this visit.    Allergies:   Review of patient's allergies indicates no known allergies.   Social History:  The patient  reports that he has been smoking Cigarettes.  He has a 50 pack-year smoking history. He has never used smokeless tobacco. He reports that he drinks alcohol. He reports that he does not use illicit drugs.   Family History:  The patient's  family history includes Asthma in his mother; Colon cancer (age of onset: 85) in his sister; Heart disease in his father.    ROS:   Please see the history of present illness.   All other systems are reviewed and negative.    PHYSICAL EXAM: VS:  BP 122/82 mmHg  Pulse 124  Ht 6\' 5"  (1.956 m)  Wt 99.882 kg (220 lb 3.2 oz)  BMI 26.11 kg/m2 , BMI Body mass index is 26.11 kg/(m^2). GEN: Well nourished, well developed, in no acute distress HEENT: normal Neck: no JVD, carotid bruits, or masses Cardiac: tachycardic irregular rhythm; no murmurs, rubs, or gallops,no edema  Respiratory:  clear to auscultation bilaterally, normal work of breathing GI: soft, nontender, nondistended, + BS MS: no deformity or atrophy Skin: warm and dry  Neuro:  Strength and sensation are intact Psych: euthymic mood, full affect  EKG:  EKG is ordered today. The ekg ordered today shows afib, V rate    Recent Labs: 11/06/2013: ALT 10 05/07/2014: BUN 16; Creatinine 0.99; Hemoglobin 15.1; Platelets 172; Potassium 3.9; Sodium 138    Lipid Panel     Component Value Date/Time   CHOL 129 01/04/2011 0812   TRIG 86 01/04/2011 0812   HDL 41 01/04/2011 0812   CHOLHDL 3.1 01/04/2011 0812   VLDL 17 01/04/2011 0812   LDLCALC 71 01/04/2011 0812     Wt Readings from Last 3 Encounters:  08/12/14 99.882 kg (220 lb 3.2 oz)  01/10/14 100.064 kg (220 lb 9.6 oz)  01/07/14 98.521 kg (217 lb 3.2 oz)      Other studies Reviewed: Additional studies/ records that were reviewed today include: records from Dr Tyler Pita office are reviewed   ASSESSMENT AND PLAN:  1.  Persistent atrial fibrillation Likely exacerbated by ETOH use.  Given long h/o afib, I would expect that success rates with ablation are reduced. He did very well previously with amiodarone. Therapeutic strategies for afib including medicine and ablation were discussed in detail with the patient today. Risk, benefits, and alternatives to EP study and radiofrequency ablation for afib were also discussed in detail today.  At this time, he is very clear that he would prefer to avoid ablation.   He would rather return to amiodarone with close follow-up.   He is aware of risks of amiodarone including but not limited to liver/thyroid/occular/lung toxicity and wishes to restart this medicine. Start amiodarone 200mg  BID Increase diltiazem and metoprolol for better HR control.   Return to AF clinic in 2 weeks for further medicine titration and to arrange cardioversion if still in AF.  He needs an echo.  He is instructed to have Dr Tyler Pita office perform an echo prior to return to the AF clinic  2. ETOH Cessation advised  3. Dilated cardiomyopathy Echo as above Increase metoprolol and diltiazem as above I think that it is imperative that he maintain sinus rhythm if possible long  term.  4. HTN Stable No change required today  5. Tobacco Cessation advised  This is a very complicated patient with very difficult to manage afib and risks of decompensation.  A high level of decision making was required for the encounter today.  Current medicines are reviewed at length with the patient today.   The patient does not have concerns regarding his medicines.  The following changes were made today:  none  Signed, Thompson Grayer, MD  08/12/2014 12:09 PM     [address] [place] [phone] (office) [phone] (fax)

## 2014-08-17 ENCOUNTER — Other Ambulatory Visit: Payer: Self-pay | Admitting: Internal Medicine

## 2014-08-23 ENCOUNTER — Ambulatory Visit (HOSPITAL_COMMUNITY): Payer: Medicare HMO | Admitting: Nurse Practitioner

## 2014-08-30 ENCOUNTER — Encounter
Admission: RE | Disposition: A | Discharge status: Home / Self Care | Payer: Self-pay | Source: Ambulatory Visit | Attending: Internal Medicine

## 2014-08-30 ENCOUNTER — Ambulatory Visit: Payer: Medicare HMO | Admitting: Anesthesiology

## 2014-08-30 ENCOUNTER — Encounter: Payer: Self-pay | Admitting: *Deleted

## 2014-08-30 ENCOUNTER — Ambulatory Visit
Admission: RE | Admit: 2014-08-30 | Discharge: 2014-08-30 | Disposition: A | Discharge status: Home / Self Care | Payer: Medicare HMO | Source: Ambulatory Visit | Attending: Internal Medicine | Admitting: Internal Medicine

## 2014-08-30 DIAGNOSIS — I429 Cardiomyopathy, unspecified: Secondary | ICD-10-CM | POA: Insufficient documentation

## 2014-08-30 DIAGNOSIS — Z8249 Family history of ischemic heart disease and other diseases of the circulatory system: Secondary | ICD-10-CM | POA: Diagnosis not present

## 2014-08-30 DIAGNOSIS — I10 Essential (primary) hypertension: Secondary | ICD-10-CM | POA: Diagnosis not present

## 2014-08-30 DIAGNOSIS — Z87891 Personal history of nicotine dependence: Secondary | ICD-10-CM | POA: Diagnosis not present

## 2014-08-30 DIAGNOSIS — Z7901 Long term (current) use of anticoagulants: Secondary | ICD-10-CM | POA: Insufficient documentation

## 2014-08-30 DIAGNOSIS — I480 Paroxysmal atrial fibrillation: Secondary | ICD-10-CM | POA: Insufficient documentation

## 2014-08-30 DIAGNOSIS — E785 Hyperlipidemia, unspecified: Secondary | ICD-10-CM | POA: Diagnosis not present

## 2014-08-30 DIAGNOSIS — F419 Anxiety disorder, unspecified: Secondary | ICD-10-CM | POA: Diagnosis not present

## 2014-08-30 DIAGNOSIS — I2510 Atherosclerotic heart disease of native coronary artery without angina pectoris: Secondary | ICD-10-CM | POA: Diagnosis not present

## 2014-08-30 DIAGNOSIS — Z85038 Personal history of other malignant neoplasm of large intestine: Secondary | ICD-10-CM | POA: Insufficient documentation

## 2014-08-30 DIAGNOSIS — F329 Major depressive disorder, single episode, unspecified: Secondary | ICD-10-CM | POA: Insufficient documentation

## 2014-08-30 DIAGNOSIS — Z951 Presence of aortocoronary bypass graft: Secondary | ICD-10-CM | POA: Insufficient documentation

## 2014-08-30 DIAGNOSIS — Z79899 Other long term (current) drug therapy: Secondary | ICD-10-CM | POA: Diagnosis not present

## 2014-08-30 DIAGNOSIS — I4891 Unspecified atrial fibrillation: Secondary | ICD-10-CM | POA: Diagnosis present

## 2014-08-30 HISTORY — PX: ELECTROPHYSIOLOGIC STUDY: SHX172A

## 2014-08-30 SURGERY — CARDIOVERSION (CATH LAB)
Anesthesia: General

## 2014-08-30 MED ORDER — PROPOFOL 10 MG/ML IV BOLUS
INTRAVENOUS | Status: DC | PRN
  Administered 2014-08-30: 10 mg via INTRAVENOUS
  Administered 2014-08-30: 40 mg via INTRAVENOUS
  Administered 2014-08-30: 20 mg via INTRAVENOUS

## 2014-08-30 MED ORDER — MIDAZOLAM HCL 2 MG/2ML IJ SOLN
INTRAMUSCULAR | Status: DC | PRN
  Administered 2014-08-30: 1 mg via INTRAVENOUS

## 2014-08-30 MED ORDER — SODIUM CHLORIDE 0.9 % IV SOLN
INTRAVENOUS | Status: DC
  Administered 2014-08-30: 07:00:00 via INTRAVENOUS

## 2014-08-30 NOTE — Anesthesia Preprocedure Evaluation (Signed)
Anesthesia Evaluation  Patient identified by MRN, date of birth, ID band Patient awake    Reviewed: Allergy & Precautions, NPO status , Patient's Chart, lab work & pertinent test results, reviewed documented beta blocker date and time   History of Anesthesia Complications Negative for: history of anesthetic complications  Airway Mallampati: II  TM Distance: >3 FB Neck ROM: Full    Dental  (+) Upper Dentures, Lower Dentures   Pulmonary COPDCurrent Smoker,  breath sounds clear to auscultation  Pulmonary exam normal       Cardiovascular Exercise Tolerance: Poor hypertension, Pt. on medications and Pt. on home beta blockers + CAD and +CHF Normal cardiovascular exam+ dysrhythmias Atrial Fibrillation Rhythm:Regular Rate:Normal  -LVEF 30-35% -s/p CABG   Neuro/Psych Anxiety Depression negative neurological ROS     GI/Hepatic negative GI ROS, (+) Hepatitis -, B  Endo/Other  negative endocrine ROS  Renal/GU negative Renal ROS  negative genitourinary   Musculoskeletal negative musculoskeletal ROS (+)   Abdominal   Peds negative pediatric ROS (+)  Hematology negative hematology ROS (+)   Anesthesia Other Findings H/o ETOH abuse  Reproductive/Obstetrics negative OB ROS                             Anesthesia Physical Anesthesia Plan  ASA: III  Anesthesia Plan: General   Post-op Pain Management:    Induction: Intravenous  Airway Management Planned: Nasal Cannula  Additional Equipment:   Intra-op Plan:   Post-operative Plan:   Informed Consent: I have reviewed the patients History and Physical, chart, labs and discussed the procedure including the risks, benefits and alternatives for the proposed anesthesia with the patient or authorized representative who has indicated his/her understanding and acceptance.     Plan Discussed with: CRNA  Anesthesia Plan Comments:          Anesthesia Quick Evaluation

## 2014-08-30 NOTE — Discharge Instructions (Signed)

## 2014-08-30 NOTE — Progress Notes (Signed)
Pt tolerated procedure well post cardioversion, however after few minutes of sinus, pt back into afib with Dr Nehemiah Massed present and aware. Vitals stable spoke with pt and wife, with questions answered, taking po's without difficulty, afib in 50's with bp stable, pt given discharge instructions with rtc given, pt ready for discharge

## 2014-08-30 NOTE — Anesthesia Procedure Notes (Signed)
Date/Time: 08/30/2014 7:27 AM Performed by: Christy Sartorius Pre-anesthesia Checklist: Patient identified, Emergency Drugs available, Suction available and Patient being monitored Patient Re-evaluated:Patient Re-evaluated prior to inductionOxygen Delivery Method: Nasal cannula

## 2014-08-30 NOTE — Transfer of Care (Signed)
Immediate Anesthesia Transfer of Care Note  Patient: [name]  Procedure(s) Performed: Procedure(s): CARDIOVERSION (N/A)  Patient Location: PACU  Anesthesia Type:General  Level of Consciousness: awake and sedated  Airway & Oxygen Therapy: Patient Spontanous Breathing and Patient connected to nasal cannula oxygen  Post-op Assessment: Report given to RN  Post vital signs: Reviewed and stable  Last Vitals:  Filed Vitals:   08/30/14 0751  BP: 105/83  Pulse: 57  Temp:   Resp: 17    Complications: No apparent anesthesia complications

## 2014-08-30 NOTE — Anesthesia Postprocedure Evaluation (Signed)
  Anesthesia Post-op Note  Patient: [name]  Procedure(s) Performed: Procedure(s): CARDIOVERSION (N/A)  Anesthesia type:General  Patient location: PACU  Post pain: Pain level controlled  Post assessment: Post-op Vital signs reviewed, Patient's Cardiovascular Status Stable, Respiratory Function Stable, Patent Airway and No signs of Nausea or vomiting  Post vital signs: Reviewed and stable  Last Vitals:  Filed Vitals:   08/30/14 0842  BP: 122/79  Pulse:   Temp:   Resp: 19    Level of consciousness: awake, alert  and patient cooperative  Complications: No apparent anesthesia complications

## 2014-08-30 NOTE — CV Procedure (Signed)
Electrical Cardioversion Procedure Note [name] [id number] June 13, 1945  Procedure: Electrical Cardioversion Indications:  Atrial Fibrillation  Procedure Details Consent: Risks of procedure as well as the alternatives and risks of each were explained to the (patient/caregiver).  Consent for procedure obtained. Time Out: Verified patient identification, verified procedure, site/side was marked, verified correct patient position, special equipment/implants available, medications/allergies/relevent history reviewed, required imaging and test results available.  Performed  Patient placed on cardiac monitor, pulse oximetry, supplemental oxygen as necessary.  Sedation given: Benzodiazepines and Short-acting barbiturates Pacer pads placed anterior and posterior chest.  Cardioverted 1 time(s).  Cardioverted at 120J.  Evaluation Findings: Post procedure EKG shows: NSR Complications: None Patient did tolerate procedure well.   [name] 08/30/2014, 7:46 AM

## 2014-09-14 ENCOUNTER — Ambulatory Visit: Payer: Medicare HMO | Admitting: Family

## 2014-09-29 ENCOUNTER — Encounter: Payer: Self-pay | Admitting: *Deleted

## 2014-09-29 ENCOUNTER — Encounter
Admission: RE | Disposition: A | Discharge status: Home / Self Care | Payer: Self-pay | Source: Ambulatory Visit | Attending: Internal Medicine

## 2014-09-29 ENCOUNTER — Ambulatory Visit: Payer: Medicare HMO | Admitting: Anesthesiology

## 2014-09-29 ENCOUNTER — Ambulatory Visit
Admission: RE | Admit: 2014-09-29 | Discharge: 2014-09-29 | Disposition: A | Discharge status: Home / Self Care | Payer: Medicare HMO | Source: Ambulatory Visit | Attending: Internal Medicine | Admitting: Internal Medicine

## 2014-09-29 DIAGNOSIS — I2510 Atherosclerotic heart disease of native coronary artery without angina pectoris: Secondary | ICD-10-CM | POA: Diagnosis not present

## 2014-09-29 DIAGNOSIS — Z8249 Family history of ischemic heart disease and other diseases of the circulatory system: Secondary | ICD-10-CM | POA: Insufficient documentation

## 2014-09-29 DIAGNOSIS — I10 Essential (primary) hypertension: Secondary | ICD-10-CM | POA: Diagnosis not present

## 2014-09-29 DIAGNOSIS — F419 Anxiety disorder, unspecified: Secondary | ICD-10-CM | POA: Insufficient documentation

## 2014-09-29 DIAGNOSIS — F17210 Nicotine dependence, cigarettes, uncomplicated: Secondary | ICD-10-CM | POA: Insufficient documentation

## 2014-09-29 DIAGNOSIS — E785 Hyperlipidemia, unspecified: Secondary | ICD-10-CM | POA: Insufficient documentation

## 2014-09-29 DIAGNOSIS — Z7901 Long term (current) use of anticoagulants: Secondary | ICD-10-CM | POA: Diagnosis not present

## 2014-09-29 DIAGNOSIS — Z85038 Personal history of other malignant neoplasm of large intestine: Secondary | ICD-10-CM | POA: Insufficient documentation

## 2014-09-29 DIAGNOSIS — Z79899 Other long term (current) drug therapy: Secondary | ICD-10-CM | POA: Insufficient documentation

## 2014-09-29 DIAGNOSIS — F329 Major depressive disorder, single episode, unspecified: Secondary | ICD-10-CM | POA: Diagnosis not present

## 2014-09-29 DIAGNOSIS — I429 Cardiomyopathy, unspecified: Secondary | ICD-10-CM | POA: Insufficient documentation

## 2014-09-29 DIAGNOSIS — Z951 Presence of aortocoronary bypass graft: Secondary | ICD-10-CM | POA: Insufficient documentation

## 2014-09-29 DIAGNOSIS — Z800 Family history of malignant neoplasm of digestive organs: Secondary | ICD-10-CM | POA: Insufficient documentation

## 2014-09-29 DIAGNOSIS — I4891 Unspecified atrial fibrillation: Secondary | ICD-10-CM | POA: Diagnosis present

## 2014-09-29 HISTORY — PX: ELECTROPHYSIOLOGIC STUDY: SHX172A

## 2014-09-29 SURGERY — CARDIOVERSION (CATH LAB)
Anesthesia: General

## 2014-09-29 MED ORDER — PROPOFOL 10 MG/ML IV BOLUS
INTRAVENOUS | Status: DC | PRN
  Administered 2014-09-29: 100 mg via INTRAVENOUS

## 2014-09-29 MED ORDER — METOPROLOL SUCCINATE ER 50 MG PO TB24: 25.0000 mg | ORAL_TABLET | Freq: Every day | ORAL | Status: DC

## 2014-09-29 MED ORDER — SODIUM CHLORIDE 0.9 % IV SOLN
INTRAVENOUS | Status: DC | PRN
  Administered 2014-09-29: 07:00:00 via INTRAVENOUS

## 2014-09-29 NOTE — Anesthesia Preprocedure Evaluation (Signed)
Anesthesia Evaluation  Patient identified by MRN, date of birth, ID band Patient awake    Reviewed: Allergy & Precautions, NPO status   History of Anesthesia Complications Negative for: history of anesthetic complications  Airway Mallampati: II       Dental  (+) Edentulous Upper, Edentulous Lower   Pulmonary shortness of breath and with exertion, Current Smoker,  + rhonchi   + decreased breath sounds      Cardiovascular hypertension, Pt. on medications and Pt. on home beta blockers + angina + CAD Rhythm:Irregular     Neuro/Psych Anxiety Depression negative neurological ROS     GI/Hepatic negative GI ROS,   Endo/Other    Renal/GU negative Renal ROS  negative genitourinary   Musculoskeletal negative musculoskeletal ROS (+)   Abdominal Normal abdominal exam  (+)   Peds negative pediatric ROS (+)  Hematology negative hematology ROS (+)   Anesthesia Other Findings   Reproductive/Obstetrics negative OB ROS                             Anesthesia Physical Anesthesia Plan  ASA: IV  Anesthesia Plan: General   Post-op Pain Management:    Induction: Intravenous  Airway Management Planned: Nasal Cannula  Additional Equipment:   Intra-op Plan:   Post-operative Plan:   Informed Consent: I have reviewed the patients History and Physical, chart, labs and discussed the procedure including the risks, benefits and alternatives for the proposed anesthesia with the patient or authorized representative who has indicated his/her understanding and acceptance.     Plan Discussed with: CRNA  Anesthesia Plan Comments:         Anesthesia Quick Evaluation

## 2014-09-29 NOTE — CV Procedure (Signed)
Electrical Cardioversion Procedure Note MISTER KRAHENBUHL [id number] April 18, 1945  Procedure: Electrical Cardioversion Indications:  Atrial Fibrillation  Procedure Details Consent: Risks of procedure as well as the alternatives and risks of each were explained to the (patient/caregiver).  Consent for procedure obtained. Time Out: Verified patient identification, verified procedure, site/side was marked, verified correct patient position, special equipment/implants available, medications/allergies/relevent history reviewed, required imaging and test results available.  Performed  Patient placed on cardiac monitor, pulse oximetry, supplemental oxygen as necessary.  Sedation given: Benzodiazepines and Short-acting barbiturates Pacer pads placed anterior and posterior chest.  Cardioverted 1 time(s).  Cardioverted at 120J.  Evaluation Findings: Post procedure EKG shows: NSR Complications: None Patient did tolerate procedure well.   [name] 09/29/2014, 7:37 AM

## 2014-09-29 NOTE — Discharge Instructions (Signed)

## 2014-09-29 NOTE — Transfer of Care (Signed)
Immediate Anesthesia Transfer of Care Note  Patient: [name]  Procedure(s) Performed: Procedure(s): CARDIOVERSION (N/A)  Patient Location: Cath Lab and SPU  Anesthesia Type:General  Level of Consciousness: awake and alert   Airway & Oxygen Therapy: Patient Spontanous Breathing and Patient connected to nasal cannula oxygen  Post-op Assessment: Report given to RN  Post vital signs: Reviewed  Last Vitals:  Filed Vitals:   09/29/14 0739  BP: 118/78  Pulse: 63  Temp:   Resp: 19    Complications: No apparent anesthesia complications

## 2014-09-29 NOTE — Anesthesia Postprocedure Evaluation (Signed)
  Anesthesia Post-op Note  Patient: [name]  Procedure(s) Performed: Procedure(s): CARDIOVERSION (N/A)  Anesthesia type:General  Patient location: PACU  Post pain: Pain level controlled  Post assessment: Post-op Vital signs reviewed, Patient's Cardiovascular Status Stable, Respiratory Function Stable, Patent Airway and No signs of Nausea or vomiting  Post vital signs: Reviewed and stable  Last Vitals:  Filed Vitals:   09/29/14 0745  BP: 95/71  Pulse: 52  Temp:   Resp: 23    Level of consciousness: awake, alert  and patient cooperative  Complications: No apparent anesthesia complications

## 2014-11-18 ENCOUNTER — Encounter: Payer: Self-pay | Admitting: Internal Medicine

## 2014-12-12 ENCOUNTER — Encounter: Payer: Self-pay | Admitting: Neurology

## 2014-12-12 ENCOUNTER — Ambulatory Visit (INDEPENDENT_AMBULATORY_CARE_PROVIDER_SITE_OTHER): Payer: Medicare HMO | Admitting: Neurology

## 2014-12-12 VITALS — BP 132/82 | HR 70 | Resp 16 | Ht 77.0 in | Wt 218.0 lb

## 2014-12-12 DIAGNOSIS — Z951 Presence of aortocoronary bypass graft: Secondary | ICD-10-CM

## 2014-12-12 DIAGNOSIS — G479 Sleep disorder, unspecified: Secondary | ICD-10-CM | POA: Diagnosis not present

## 2014-12-12 DIAGNOSIS — I480 Paroxysmal atrial fibrillation: Secondary | ICD-10-CM

## 2014-12-12 DIAGNOSIS — G4761 Periodic limb movement disorder: Secondary | ICD-10-CM

## 2014-12-12 NOTE — Patient Instructions (Signed)
We will check a sleep study to see if you have obstructive sleep apnea or OSA. If you have more than mild OSA, I want you to consider treatment with CPAP. Please remember, the risks and ramifications of moderate to severe obstructive sleep apnea or OSA are: Cardiovascular disease, including congestive heart failure, stroke, difficult to control hypertension, arrhythmias, and even type 2 diabetes has been linked to untreated OSA. Sleep apnea causes disruption of sleep and sleep deprivation in most cases, which, in turn, can cause recurrent headaches, problems with memory, mood, concentration, focus, and vigilance. Most people with untreated sleep apnea report excessive daytime sleepiness, which can affect their ability to drive. Please do not drive if you feel sleepy.   I do not encourage the longterm use of a benzodiazepine for sleep, as these medications are additive and especially, do not mix with alcohol. I would like to suggest that you think about seeing a psychiatrist for your anxiety disorder - talk to Dr. Ronnald Ramp about it.   I will likely see you back after your sleep study to go over the test results and where to go from there. We will call you after your sleep study to advise about the results (most likely, you will hear from Beverlee Nims, my nurse) and to set up an appointment at the time, as necessary.    Our sleep lab administrative assistant, Arrie Aran will meet with you or call you to schedule your sleep study. If you don't hear back from her by next week please feel free to call her at [phone]. This is her direct line and please leave a message with your phone number to call back if you get the voicemail box. She will call back as soon as possible.

## 2014-12-12 NOTE — Progress Notes (Signed)
Subjective:    Patient ID: [name] is a 69 y.o. male.  HPI    Star Age, MD, PhD [facility] [address], [address] [address] [place], [place] [address]   Dear Dr. Ronnald Ramp,   I saw your patient, [name], upon your kind request in my neurologic clinic today for initial consultation of his sleep disturbance, in particular sleep disruption, difficulty maintaining sleep and initiating sleep for years. The patient is unaccompanied today. As you know, [name] is a 69 year old right-handed gentleman with an underlying medical history of low back pain, overweight state, history of pneumonia, arthritis, paroxysmal atrial fibrillation (s/p multimple cardioversions), anxiety, depression, history of hepatitis B, coronary artery disease, status post CABG, colon cancer (s/p partical colectomy in 2014, no chemo needed), hyperlipidemia, hypertension, smoking (1 ppd, over 50 years), and prior alcohol abuse,  with possible relapse earlier this year, who reports difficulty with his sleep, for years. He has no significant snoring, no significant difficulty maintaining sleep. He has rare RLS symptoms and has been told he kicks in his sleep. He lives with [name]. He has not woken up with any sense of gasping for air. Once he is asleep he feels he is not disturbed. He wakes up fairly adequately rested. His Epworth sleepiness score is 2 out of 24 today, his fatigue scores 22 out of 63. Bedtime is around 9:30. He takes 2 mg of Ativan. It makes him sleepy after 30-45 minutes. He watches TV in bed but turns it off. There is no pet in the bed. She shares his bed. He has 2 cups of coffee in the mornings and 2 glasses of ice tea during the day and ginger ale at night. He does not drink a lot of water. He drinks 6 beers per month on average. He is going to start Chantix for smoking cessation soon. For sleep he has tried in the past over-the-counter medication including melatonin and p.m.  type medications. At one time he was on Ativan and Ambien together but stopped the Ambien. Originally, his Ativan was started for anxiety, particularly prior to his heart surgery. He has not seen a psychiatrist for anxiety management.  I reviewed your office note from 11/01/2014 which you kindly included. Also reviewed the office note from Dr. Joylene Grapes, his cardiologist, from 08/12/2014. He has been on Ativan for anxiety for years, since 2012.   His Past Medical History Is Significant For: Past Medical History  Diagnosis Date  . Persistent atrial fibrillation ([place])     diagnosed 2010  . 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 03/21/2011 - 2D Echo: EF 25% to 30%. Diffuse hypokinesis.  Marland Kitchen HEMORRHOIDS 01/17/2009  . ALCOHOL ABUSE 01/18/2009  . Insomnia     TAKES ATIVAN TO SLEEP  . Colon cancer ([place])   . Hepatitis     hepatitis B-never treated-Titers were low  . Coronary artery disease     s/p CABG  . HTN (hypertension)   . HLD (hyperlipidemia)   . Depression   . Anxiety   . H/O alcohol abuse   . Rhonchi   . Fatigue   . Left shoulder pain   . A-fib [facility])     His Past Surgical History Is Significant For: Past Surgical History  Procedure Laterality Date  . Mouth surgery    . Cardiac catheterization    . Colonoscopy N/A 02/11/2013    Procedure: COLONOSCOPY;  Surgeon: Milus Banister, MD;  Location:  [facility] ENDOSCOPY;  Service: Endoscopy;  Laterality: N/A;  . Colon surgery  07/2012    COLON CANCER  . Coronary artery bypass graft      triple  bypass OCT 2013 in [place]  . Hemorrhoid surgery N/A 03/09/2013    Procedure: HEMORRHOIDECTOMY;  Surgeon: Pedro Earls, MD;  Location: [facility] ORS;  Service: General;  Laterality: N/A;  . Flexible sigmoidoscopy N/A 12/16/2013    Procedure: Beryle Quant;  Surgeon: Milus Banister, MD;  Location: [facility] ENDOSCOPY;  Service: Endoscopy;  Laterality: N/A;  . Electrophysiologic study N/A 08/30/2014    Procedure: CARDIOVERSION;   Surgeon: [name], MD;  Location: [facility] ORS;  Service: Cardiovascular;  Laterality: N/A;  . Electrophysiologic study N/A 09/29/2014    Procedure: CARDIOVERSION;  Surgeon: [name], MD;  Location: [facility] ORS;  Service: Cardiovascular;  Laterality: N/A;    His Family History Is Significant For: Family History  Problem Relation Age of Onset  . Heart disease Father   . Asthma Mother   . Dementia Mother   . Colon cancer Sister 34    His Social History Is Significant For: Social History   Social History  . Marital Status: Divorced    Spouse Name: N/A  . Number of Children: 2  . Years of Education: 12   Occupational History  . RetiredAnimal nutritionist    .     Social History Main Topics  . Smoking status: Current Every Day Smoker -- 1.00 packs/day for 50 years    Types: Cigarettes  . Smokeless tobacco: Never Used     Comment: tobacco info given 01/07/14  . Alcohol Use: 0.0 oz/week    0 Standard drinks or equivalent per week     Comment: 2 drinks per month, previously heavy  . Drug Use: No  . Sexual Activity: No   Other Topics Concern  . 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   Has moved homes x 3 in 2 months   States cannot always afford meds-See PI   Drinks 2 cups of coffee a day     His Allergies Are:  No Known Allergies:   His Current Medications Are:  Outpatient Encounter Prescriptions as of 12/12/2014  Medication Sig  . amiodarone (PACERONE) 200 MG tablet Take one tablet by mouth twice daily (Patient taking differently: 400 mg daily. Take one tablet by mouth twice daily)  . apixaban (ELIQUIS) 5 MG TABS tablet Take 5 mg by mouth 2 (two) times daily.  Marland Kitchen LORazepam (ATIVAN) 2 MG tablet Take 1 tablet (2 mg total) by mouth at bedtime.  . metoprolol succinate (TOPROL-XL) 50 MG 24 hr tablet Take 1 tablet (50 mg total) by mouth daily. Take with or immediately following a  meal.  . [DISCONTINUED] diltiazem (CARDIZEM) 120 MG tablet Take by mouth.   No facility-administered encounter medications on file as of 12/12/2014.  :  Review of Systems:  Out of a complete 14 point review of systems, all are reviewed and negative with the exception of these symptoms as listed below:   Review of Systems  Neurological:       Patient reports that he has trouble falling asleep, no trouble staying asleep once he has fallen asleep, denies taking naps.   Hematological: Bruises/bleeds easily.  Psychiatric/Behavioral:       Anxiety    Objective:  Neurologic Exam  Physical Exam  Physical Examination:   Filed Vitals:   12/12/14 0929  BP: 132/82  Pulse: 70  Resp: 16   General Examination: The patient is a very pleasant 69 y.o. male in no acute distress. He appears well-developed and well-nourished and adequately groomed.   HEENT: Normocephalic, atraumatic, pupils are equal, round and reactive to light and accommodation. Funduscopic exam is normal with sharp disc margins noted. Extraocular tracking is good without limitation to gaze excursion or nystagmus noted. Normal smooth pursuit is noted. Hearing is grossly intact. Tympanic membranes are clear bilaterally. Face is symmetric with normal facial animation and normal facial sensation. Speech is clear with no dysarthria noted. There is no hypophonia. There is no lip, neck/head, jaw or voice tremor. Neck is supple with full range of passive and active motion. There are no carotid bruits on auscultation. Oropharynx exam reveals: mild mouth dryness, adequate dental hygiene and moderate airway crowding, due to redundant soft palate and large uvula. Tonsils are small, could be absent? The patient reports no tonsillectomy. Mallampati is class II. Tongue protrudes centrally and palate elevates symmetrically. Neck size is 17 1/8 inches. He has a Mild overbite.    Chest: Clear to auscultation without wheezing, rhonchi or crackles  noted.  Heart: S1+S2+0, regular and normal without murmurs, rubs or gallops noted.   Abdomen: Soft, non-tender and non-distended with normal bowel sounds appreciated on auscultation.  Extremities: There is no pitting edema in the distal lower extremities bilaterally. Pedal pulses are intact.  Skin: Warm and dry without trophic changes notedin the upper extremities. Toes are slightly pale appearing and mild discoloration is noted in the distal legs bilaterally, toes and feet are colder than upper extremities. There are no varicose veins. Unremarkable scar in the left forearm from blood vessel harvesting is noted.  Musculoskeletal: exam reveals no obvious joint deformities, tenderness or joint swelling or erythema.   Neurologically:  Mental status: The patient is awake, alert and oriented in all 4 spheres. His immediate and remote memory, attention, language skills and fund of knowledge are appropriate. There is no evidence of aphasia, agnosia, apraxia or anomia. Speech is clear with normal prosody and enunciation. Thought process is linear. Mood is normal and affect is blunted.  Cranial nerves II - XII are as described above under HEENT exam. In addition: shoulder shrug is normal with equal shoulder height noted. Motor exam: Normal bulk, strength and tone is noted. There is no drift, tremor or rebound. Romberg is negative. Reflexes are 1+ throughout, but absent in both ankles. Babinski: Toes are flexor bilaterally. Fine motor skills and coordination: intact with normal finger taps, normal hand movements, normal rapid alternating patting, normal foot taps and normal foot agility.  Cerebellar testing: No dysmetria or intention tremor on finger to nose testing. Heel to shin is unremarkable bilaterally. There is no truncal or gait ataxia.  Sensory exam: intact to light touch, pinprick, vibration, temperature sense in the upper and lower extremities.  Gait, station and balance: He stands easily. No  veering to one side is noted. No leaning to one side is noted. Posture is age-appropriate and stance is narrow based. Gait shows normal stride length and normal pace. No problems turning are noted. He turns en bloc, however tandem walk is challenging for him.               Assessment and Plan:  In summary, WYNDELL CARDIFF is a very pleasant 69 y.o.-year old male with an underlying medical history of low back pain, overweight  state, history of pneumonia, arthritis, paroxysmal atrial fibrillation (s/p multimple cardioversions), anxiety, depression, history of hepatitis B, coronary artery disease, status post CABG, colon cancer (s/p partical colectomy in 2014, no chemo needed), hyperlipidemia, hypertension, smoking (1 ppd, over 50 years), and prior alcohol abuse,  with possible relapse earlier this year, who reports difficulty with his sleep, for years. In light of his medical history and in particular his cardiac history with A. fib history and CABG, as well as ongoing smoking as an additional vascular risk factor, and in light of a moderately crowded airway, sleep testing is recommended in the form of a overnight sleep study. I talked to the patient at length about his sleep difficulties. He may have some PLMD as well. I did not recommend ongoing and long-term use of a benzodiazepine for sleep. He is encouraged to discuss with you further options for anxiety management and possibly a referral to psychiatry for anxiety disorder. He is encouraged to talk with you about the possibility of trying Chantix as I do not prescribe this medication.  I had a long chat with the patient about my findings and the importance of ruling out obstructive sleep apnea. We talked about its prognosis and treatment options. I explained the CPAP therapy option to him. He is advised to drink more water. I explained in particular the risks and ramifications of untreated moderate to severe OSA, especially with respect to developing  cardiovascular disease down the Road, including congestive heart failure, difficult to treat hypertension, cardiac arrhythmias, or stroke. Even type 2 diabetes has, in part, been linked to untreated OSA. Symptoms of untreated OSA include daytime sleepiness, memory problems, mood irritability and mood disorder such as depression and anxiety, lack of energy, as well as recurrent headaches, especially morning headaches. We talked about smoking cessation and trying to maintain a healthy lifestyle in general, as well as the importance of weight control. I encouraged the patient to eat healthy, exercise daily and keep well hydrated, to keep a scheduled bedtime and wake time routine, to not skip any meals and eat healthy snacks in between meals. I advised the patient not to drive when feeling sleepy. I recommended the following at this time: sleep study with potential positive airway pressure titration. (We will score hypopneas at 4% and split the sleep study into diagnostic and treatment portion, if the estimated. 2 hour AHI is >15/h or as mandated by his insurance).   I explained the sleep test procedure to the patient and also outlined possible surgical and non-surgical treatment options of OSA, including the use of a custom-made dental device (which would require a referral to a specialist dentist or oral surgeon), upper airway surgical options, such as pillar implants, radiofrequency surgery, tongue base surgery, and UPPP (which would involve a referral to an ENT surgeon). Rarely, jaw surgery such as mandibular advancement may be considered.  I also explained the CPAP treatment option to the patient, who indicated that he would be willing to try CPAP if the need arises. I explained the importance of being compliant with PAP treatment, not only for insurance purposes but primarily to improve His symptoms, and for the patient's long term health benefit, including to reduce His cardiovascular risks. I answered all  his questions today and the patient was in agreement. I would like to see him back after the sleep study is completed and encouraged him to call with any interim questions, concerns, problems or updates.   Thank you very much for allowing me to  participate in the care of this nice patient. If I can be of any further assistance to you please do not hesitate to call me at [phone].  Sincerely,   Star Age, MD, PhD

## 2014-12-16 ENCOUNTER — Ambulatory Visit (INDEPENDENT_AMBULATORY_CARE_PROVIDER_SITE_OTHER): Payer: Medicare HMO | Admitting: Psychiatry

## 2014-12-16 VITALS — BP 138/86 | HR 60 | Ht 77.0 in | Wt 220.0 lb

## 2014-12-16 DIAGNOSIS — F5101 Primary insomnia: Secondary | ICD-10-CM | POA: Diagnosis not present

## 2014-12-16 DIAGNOSIS — G4701 Insomnia due to medical condition: Secondary | ICD-10-CM

## 2014-12-16 DIAGNOSIS — F1320 Sedative, hypnotic or anxiolytic dependence, uncomplicated: Secondary | ICD-10-CM | POA: Diagnosis not present

## 2014-12-16 MED ORDER — LORAZEPAM 2 MG PO TABS: ORAL_TABLET | ORAL | Status: DC

## 2014-12-16 MED ORDER — TRAZODONE HCL 50 MG PO TABS: ORAL_TABLET | ORAL | Status: DC

## 2014-12-16 NOTE — Progress Notes (Signed)
Psychiatric Initial Adult Assessment   Patient Identification: [name] MRN:  [id number] Date of Evaluation:  12/16/2014 Referral Source: Dr. Gardner Candle Chief Complaint:   insomnia Visit Diagnosis: Benzodiazepine dependence This patient is a 69 year old married father is been retired for 6 years and comes around questions of the use of his Ativan that he was started on 3 years ago. At that time he was hospitalized and needed an agent to help him sleep. He was started on 2 mg and has been on it since. When his made efforts to come off of it he clearly cannot sleep. Is not really given it for anxiety. The patient denies persistent excessive anxiety. He denies daily depression. He sleeps fairly well when he takes the Ativan. His problems were initiating sleep. He would take him hours to fall asleep without the Ativan. The patient is eating well. He's got good energy. He can concentrate without any problems. He denies worthlessness. The patient denies being suicidal now or ever. The patient denies the use of any alcohol at this time. In 2013 and the years before he did have bouts where he drank excessively and it did affect his relationship. His alcohol has now been reduced significantly and is no longer an issue. The patient denies the use of drugs. He denies ever having psychotic symptoms. He denies ever having an episode of major depression or manic symptoms. He denies any anxiety symptoms consistent with generalized anxiety disorder, panic disorder or obsessive-compulsive disorder. The patient has been married for 32 years and is very stable marriage. He is to a dull children who are stable. Patient financially is doing well. He did have the death of his brother died this year from Alzheimer's. The patient is no distinct psychiatric hospital history but he was hospitalized at behavioral Center for 2 days around his alcohol use. This was many years ago 2001. The patient is never been seen by a  psychiatrist has never been in therapy. He's tried a number of over-the-counter sleeping aids including melatonin which he said were ineffective or caused side effects. The patient is a high school graduate has no history of sexual or physical abuse as a child. He's never had a seizure or head trauma. Generally feels fairly well. It should be noted that he has a sleep study this coming up in the next week. I suspect this is distinctly for this issue of insomnia.   ICD-9-CM ICD-10-CM   1. Insomnia due to medical condition 327.01 G47.01 LORazepam (ATIVAN) 2 MG tablet   Diagnosis:   Patient Active Problem List   Diagnosis Date Noted  . TI (tricuspid incompetence) [I07.1] 07/28/2014  . Benign essential HTN [I10] 07/22/2014  . Arteriosclerosis of coronary artery [I25.10] 02/10/2014  . MI (mitral incompetence) [I34.0] 02/10/2014  . Persistent atrial fibrillation ([place]) [I48.1] 02/10/2014  . Constipation [K59.00] 01/10/2014  . Pulmonary nodule, left [R91.1] 11/29/2013  . Anxiety and depression [F41.8] 07/27/2013  . Chest pain, unspecified [R07.9] 07/27/2013  . History of alcoholism ([place]) [F10.21] 07/27/2013  . History of tobacco use [Z87.891] 07/27/2013  . 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 H/O coronary artery bypass surgery [Z95.1] 07/27/2013  . Breath shortness [R06.02] 07/27/2013  . Status post hemorrhoidectomy Dec 2014 [Z98.890, Z87.19] 03/10/2013  . Hx of CABG [Z95.1] 01/22/2013  . Insomnia [G47.00] 09/30/2012  . History of hepatitis B [Z86.19] 09/09/2012  . Colon cancer ([place]) [C18.9] 07/28/2012  . Thrombocytopenia ([place]) [D69.6] 11/04/2011  . Chest pain [R07.9] 11/04/2011  .  Chronic systolic heart failure ([place]) [I50.22] 11/03/2011  . 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 Alcohol abuse [F10.10] 01/18/2009  . TOBACCO ABUSE [F17.200] 01/18/2009  . DEPRESSION [F32.9] 01/17/2009   History of Present Illness:   Elements:    Associated Signs/Symptoms: Depression Symptoms:   (Hypo) Manic Symptoms:   Anxiety Symptoms:   Psychotic Symptoms:   PTSD Symptoms:   Past Medical History:  Past Medical History  Diagnosis Date  . Persistent atrial fibrillation ([place])     diagnosed 2010  . 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 03/21/2011 - 2D Echo: EF 25% to 30%. Diffuse hypokinesis.  Marland Kitchen HEMORRHOIDS 01/17/2009  . ALCOHOL ABUSE 01/18/2009  . Insomnia     TAKES ATIVAN TO SLEEP  . Colon cancer ([place])   . Hepatitis     hepatitis B-never treated-Titers were low  . Coronary artery disease     s/p CABG  . HTN (hypertension)   . HLD (hyperlipidemia)   . Depression   . Anxiety   . H/O alcohol abuse   . Rhonchi   . Fatigue   . Left shoulder pain   . A-fib [facility])     Past Surgical History  Procedure Laterality Date  . Mouth surgery    . Cardiac catheterization    . Colonoscopy N/A 02/11/2013    Procedure: COLONOSCOPY;  Surgeon: Milus Banister, MD;  Location[facility];  Service: Endoscopy;  Laterality: N/A;  . Colon surgery  07/2012    COLON CANCER  . Coronary artery bypass graft      triple  bypass OCT 2013 in [place]  . Hemorrhoid surgery N/A 03/09/2013    Procedure: HEMORRHOIDECTOMY;  Surgeon: Pedro Earls, MD;  Location: [facility];  Service: General;  Laterality: N/A;  . Flexible sigmoidoscopy N/A 12/16/2013    Procedure: Beryle Quant;  Surgeon: Milus Banister, MD;  [facility];  Service: Endoscopy;  Laterality: N/A;  . Electrophysiologic study N/A 08/30/2014    Procedure: CARDIOVERSION;  Surgeon: [name], MD;  Location: [facility] ORS;  Service: Cardiovascular;  Laterality: N/A;  . Electrophysiologic study N/A 09/29/2014    Procedure: CARDIOVERSION;  Surgeon: [name], MD;  Location: [facility] ORS;  Service: Cardiovascular;  Laterality: N/A;   Family History:  Family History  Problem Relation Age of Onset  . Heart disease Father   . Asthma Mother   .  Dementia Mother   . Colon cancer Sister 45   Social History:   Social History   Social History  . Marital Status: Divorced    Spouse Name: N/A  . Number of Children: 2  . Years of Education: 12   Occupational History  . RetiredAnimal nutritionist    .     Social History Main Topics  . Smoking status: Current Every Day Smoker -- 1.00 packs/day for 50 years    Types: Cigarettes  . Smokeless tobacco: Never Used     Comment: tobacco info given 01/07/14  . Alcohol Use: 0.0 oz/week    0 Standard drinks or equivalent per week     Comment: 2 drinks per month, previously heavy  . Drug Use: No  . Sexual Activity: No   Other Topics Concern  . 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   Has moved homes x 3 in 2 months  States cannot always afford meds-See PI   Drinks 2 cups of coffee a day    Additional Social History:    Musculoskeletal: Strength & Muscle Tone: within normal limits Gait & Station: normal Patient leans: Right  Psychiatric Specialty Exam: HPI  ROS  Blood pressure 138/86, pulse 60, height 6\' 5"  (1.956 m), weight 220 lb (99.791 kg).Body mass index is 26.08 kg/(m^2).  General Appearance: Casual  Eye Contact:  Good  Speech:  Clear and Coherent  Volume:  Normal  Mood:  Negative  Affect:  NA  Thought Process:  Coherent  Orientation:  Full (Time, Place, and Person)  Thought Content:  WDL  Suicidal Thoughts:  No  Homicidal Thoughts:  No  Memory:  NA  Judgement:  Good  Insight:  Good  Psychomotor Activity:  Normal  Concentration:  Good  Recall:  NA  Fund of Knowledge:Good  Language: Good  Akathisia:  No  Handed:  Right  AIMS (if indicated):    Assets:  Desire for Improvement  ADL's:  Intact  Cognition: WNL  Sleep:  Good    Is the patient at risk to self?  No. Has the patient been a risk to self in the past 6 months?  No. Has the patient been a risk to self  within the distant past?  No. Is the patient a risk to others?  No. Has the patient been a risk to others in the past 6 months?  No. Has the patient been a risk to others within the distant past?  No.  Allergies:  No Known Allergies Current Medications: Current Outpatient Prescriptions  Medication Sig Dispense Refill  . amiodarone (PACERONE) 200 MG tablet Take one tablet by mouth twice daily (Patient taking differently: 400 mg daily. Take one tablet by mouth twice daily) 60 tablet 3  . apixaban (ELIQUIS) 5 MG TABS tablet Take 5 mg by mouth 2 (two) times daily.    Marland Kitchen LORazepam (ATIVAN) 2 MG tablet 1 qhs  For  71month then  Half qhs for 2weeks 45 tablet 0  . metoprolol succinate (TOPROL-XL) 50 MG 24 hr tablet Take 1 tablet (50 mg total) by mouth daily. Take with or immediately following a meal. 30 tablet 6  . traZODone (DESYREL) 50 MG tablet 1 qhs 30 tablet 4   No current facility-administered medications for this visit.    Previous Psychotropic Medications: Yes   Substance Abuse History in the last 12 months:  No.  Consequences of Substance Abuse:   Medical Decision Making:  Self-Limited or Minor (1)  Treatment Plan Summary: At this point it is my opinion that this patient does not have a significant psychiatric illness. He had a distant history of some alcohol abuse but that is resolved. At this time is no specific anxiety disorder where it is indicated that he should be on Ativan. I think it is reasonable for the patient to try a different agent for sleep and try to take it on a when necessary basis. Ideally this patient is diagnosed with a primary form of insomnia with benefit by being in cognitive behavioral therapy for sleep. This recently shown to be very effective but I'm unaware of its availability in this community. At this time the patient shows to work with me around getting off the Ativan. Given that it's controlled agent and that he is getting older it makes sense to remove it.  He's agreed to the plan to reduce it from 2 mg to dose of 1 mg every  night for one month and then to reduce it to a half of the pill for 2 weeks and then to discontinue it. At the same time that he is coming off the Ativan he she'll start on a low-dose of trazodone 25 mg which may be increased to 50 mg. I think this is a safer agent at this low dose for this individual. I've encouraged the patient to continue in his diagnostic evaluation by having a sleep study and of course whatever that physician prescribes her plans to do is perfectly appropriate for me and I will be willing and wishing to step out of the care of this patient. This patient to return to see me in 3 months. Again this patient is safe is not of any significant psychiatric symptomatology now. I do not believe he has an anxiety disorder.    Breland Trouten IRV[place] 10/7/201610:11 AM

## 2014-12-27 ENCOUNTER — Telehealth (HOSPITAL_COMMUNITY): Payer: Self-pay

## 2014-12-27 NOTE — Telephone Encounter (Signed)
Medication refill request - Telephone call from patient stating he saw Dr. Casimiro Needle on 12/16/14 and was in need of an Ativan order.  States his Trazodone order was received at his pharmacy but states he never recevied a prescription when in 12/16/14.  Patient states a new printed order was never given to him and requests a new order for his prescribed Ativan be called into his [facility] on [address].  Patient states he has 3 nights remaining currently so informed would send request to Dr. Casimiro Needle who will return to this office on 12/28/14.  Requested patient to call back then to see if authorized and patient agreed with plan.

## 2014-12-28 NOTE — Telephone Encounter (Signed)
Met with Dr. Casimiro Needle to inform patient is stating he never received Ativan prescription on 12/16/14 when he left that date as a printed prescription.  Dr. Casimiro Needle stated he believed this to be the case and requested this nurse call in taper plan from order 12/16/14 to patient's requested [place] at [address].  Called in Lorazepam 46m, take one tablet at bedtime for one month, then a half tablet for 2 weeks and then discontinue.  Called in #45 as was written in order form 12/16/14 and BLoletha Grayer pharmacist stated this should be for #37 and that is how he would fill order.  Verified plan noted in Dr. PMalvin Johnsnote from 12/16/14 as taper with one for one month and then 1/2 for 2 weeks and agreed with plan.  Called Mr. MProutand informed or called in order and again reviewed instructions with patient for taper as he agreed with plan.

## 2015-02-09 ENCOUNTER — Telehealth (HOSPITAL_COMMUNITY): Payer: Self-pay

## 2015-02-09 NOTE — Telephone Encounter (Signed)
Telephone call with patient after he left a message he is not sleeping but remains drowsy and has a hung over feeling each morning after taking Trazodone 50 mg at bedtime.  Patient reported Dr. Casimiro Needle tapered him off Ativan and he has now been off of this but is still having problems with sleep and reports being more irritable.  Patient requests an earlier appointment to discuss with Dr. Casimiro Needle and agrees with 9:00am appointment on 02/10/15.

## 2015-02-10 ENCOUNTER — Encounter (HOSPITAL_COMMUNITY): Payer: Self-pay | Admitting: Psychiatry

## 2015-02-10 ENCOUNTER — Ambulatory Visit (INDEPENDENT_AMBULATORY_CARE_PROVIDER_SITE_OTHER): Payer: Medicare HMO | Admitting: Psychiatry

## 2015-02-10 VITALS — BP 123/76 | HR 56 | Ht 77.0 in | Wt 221.2 lb

## 2015-02-10 DIAGNOSIS — F5105 Insomnia due to other mental disorder: Secondary | ICD-10-CM | POA: Diagnosis not present

## 2015-02-10 DIAGNOSIS — F419 Anxiety disorder, unspecified: Secondary | ICD-10-CM | POA: Diagnosis not present

## 2015-02-10 MED ORDER — SUVOREXANT 10 MG PO TABS: 20.0000 mg | ORAL_TABLET | Freq: Two times a day (BID) | ORAL | Status: DC

## 2015-02-10 NOTE — Progress Notes (Signed)
[facility] MD Progress Note  02/10/2015 9:32 AM RIOT SIPP  MRN:  [id number] Subjective: Miserable Principal Problem: Insomnia disorder Diagnosis:  Insomnia disorder At this time the patient is not doing well. The use of trazodone has not been helpful. Takes 50 mg. He shares with me at this time he is taking 250 mg in the distant past and it didn't work. He did not share this with me initially. He says he takes the 50 mg of trazodone at it makes him feel rotten in the morning and doesn't help him sleep at all. Before I saw this patient he was seen by a neurologist for sleep evaluation. Apparently the decision was made for him to have a sleep study this never occurred. [place] was never made. The patient has a number of medical conditions. Generally though he is physically stable. He is no distinct anxiety disorder. He describes himself as being chronically anxious. He states has been like this all his life. A quick review a few years ago he was started on Ativan 2 mg and it worked very well. He was started on it for episodic anxiety and insomnia. Essentially his anxiety level is at his normal range but he still is having trouble sleeping. His cardiologist is Dr. Serafina Royals. The possibility of contacting him and discussing using doxepin for this individual with a heart condition is reasonable. But there are other interventions. Today the patient denies being depressed. He's eating well and has good energy. He denies being suicidal. He enjoys building things around his condo watching TV and generally is a very stable life. He claims he is no stresses. He has a good relationship with his wife. His only problem is with sleep. Patient Active Problem List   Diagnosis Date Noted  . TI (tricuspid incompetence) [I07.1] 07/28/2014  . Benign essential HTN [I10] 07/22/2014  . Arteriosclerosis of coronary artery [I25.10] 02/10/2014  . MI (mitral incompetence) [I34.0] 02/10/2014  . Persistent atrial fibrillation  ([place]) [I48.1] 02/10/2014  . Constipation [K59.00] 01/10/2014  . Pulmonary nodule, left [R91.1] 11/29/2013  . Anxiety and depression [F41.8] 07/27/2013  . Chest pain, unspecified [R07.9] 07/27/2013  . History of alcoholism ([place]) [F10.21] 07/27/2013  . History of tobacco use [Z87.891] 07/27/2013  . 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 H/O coronary artery bypass surgery [Z95.1] 07/27/2013  . Breath shortness [R06.02] 07/27/2013  . Status post hemorrhoidectomy Dec 2014 [Z98.890, Z87.19] 03/10/2013  . Hx of CABG [Z95.1] 01/22/2013  . Insomnia [G47.00] 09/30/2012  . History of hepatitis B [Z86.19] 09/09/2012  . Colon cancer ([place]) [C18.9] 07/28/2012  . Thrombocytopenia ([place]) [D69.6] 11/04/2011  . Chest pain [R07.9] 11/04/2011  . Chronic systolic heart failure ([place]) [I50.22] 11/03/2011  . 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 Alcohol abuse [F10.10] 01/18/2009  . TOBACCO ABUSE [F17.200] 01/18/2009  . DEPRESSION [F32.9] 01/17/2009   Total Time spent with patient: 30 minutes  Past Psychiatric History:   Past Medical History:  Past Medical History  Diagnosis Date  . Persistent atrial fibrillation ([place])     diagnosed 2010  . 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 03/21/2011 - 2D Echo: EF 25% to 30%. Diffuse hypokinesis.  Marland Kitchen HEMORRHOIDS 01/17/2009  . ALCOHOL ABUSE 01/18/2009  . Insomnia     TAKES ATIVAN TO SLEEP  . Colon cancer ([place])   . Hepatitis     hepatitis B-never treated-Titers were low  . Coronary artery disease  s/p CABG  . HTN (hypertension)   . HLD (hyperlipidemia)   . Depression   . Anxiety   . H/O alcohol abuse   . Rhonchi   . Fatigue   . Left shoulder pain   . A-fib [facility])     Past Surgical History  Procedure Laterality Date  . Mouth surgery    . Cardiac catheterization    . Colonoscopy N/A 02/11/2013    Procedure: COLONOSCOPY;  Surgeon: Milus Banister, MD;  [facility];   Service: Endoscopy;  Laterality: N/A;  . Colon surgery  07/2012    COLON CANCER  . Coronary artery bypass graft      triple  bypass OCT 2013 in [place]  . Hemorrhoid surgery N/A 03/09/2013    Procedure: HEMORRHOIDECTOMY;  Surgeon: Pedro Earls, MD;  [facility];  Service: General;  Laterality: N/A;  . Flexible sigmoidoscopy N/A 12/16/2013    Procedure: Beryle Quant;  Surgeon: Milus Banister, MD;  [facility];  Service: Endoscopy;  Laterality: N/A;  . Electrophysiologic study N/A 08/30/2014    Procedure: CARDIOVERSION;  Surgeon: [name], MD;  Location: [facility] ORS;  Service: Cardiovascular;  Laterality: N/A;  . Electrophysiologic study N/A 09/29/2014    Procedure: CARDIOVERSION;  Surgeon: [name], MD;  Location: [facility] ORS;  Service: Cardiovascular;  Laterality: N/A;   Family History:  Family History  Problem Relation Age of Onset  . Heart disease Father   . Asthma Mother   . Dementia Mother   . Colon cancer Sister 57   Family Psychiatric  History:  Social History:  History  Alcohol Use  . 0.0 oz/week  . 0 Standard drinks or equivalent per week    Comment: 2 drinks per month, previously heavy     History  Drug Use No    Social History   Social History  . Marital Status: Divorced    Spouse Name: N/A  . Number of Children: 2  . Years of Education: 12   Occupational History  . RetiredAnimal nutritionist    .     Social History Main Topics  . Smoking status: Current Every Day Smoker -- 0.75 packs/day for 50 years    Types: Cigarettes  . Smokeless tobacco: Never Used     Comment: tobacco info given 01/07/14  . Alcohol Use: 0.0 oz/week    0 Standard drinks or equivalent per week     Comment: 2 drinks per month, previously heavy  . Drug Use: No  . Sexual Activity: No   Other Topics Concern  . 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    Has moved homes x 3 in 2 months   States cannot always afford meds-See PI   Drinks 2 cups of coffee a day    Additional Social History:                         Sleep: Poor  Appetite:  Good  Current Medications: Current Outpatient Prescriptions  Medication Sig Dispense Refill  . amiodarone (PACERONE) 200 MG tablet Take one tablet by mouth twice daily (Patient taking differently: 400 mg daily. Take one tablet by mouth twice daily) 60 tablet 3  . apixaban (ELIQUIS) 5 MG TABS tablet Take 5 mg by mouth 2 (two) times daily.    Marland Kitchen LORazepam (ATIVAN) 2 MG  tablet 1 qhs  For  27month then  Half qhs for 2weeks 45 tablet 0  . metoprolol succinate (TOPROL-XL) 50 MG 24 hr tablet Take 1 tablet (50 mg total) by mouth daily. Take with or immediately following a meal. 30 tablet 6  . Suvorexant (BELSOMRA) 10 MG TABS Take 20 mg by mouth 2 (two) times daily. 60 tablet 4  . traZODone (DESYREL) 50 MG tablet 1 qhs 30 tablet 4   No current facility-administered medications for this visit.    Lab Results: No results found for this or any previous visit (from the past 48 hour(s)).  Physical Findings: AIMS:  , ,  ,  ,    CIWA:    COWS:     Musculoskeletal: Strength & Muscle Tone: within normal limits Gait & Station: normal Patient leans: Right  Psychiatric Specialty Exam: ROS  Blood pressure 123/76, pulse 56, height 6\' 5"  (1.956 m), weight 221 lb 3.2 oz (100.336 kg).Body mass index is 26.23 kg/(m^2).  General Appearance: NA  Eye Contact::  Good  Speech:  Clear and Coherent  Volume:  Normal  Mood:  Euthymic  Affect:  Appropriate  Thought Process:  Coherent  Orientation:  Full (Time, Place, and Person)  Thought Content:  WDL  Suicidal Thoughts:  No  Homicidal Thoughts:  No  Memory:  NA  Judgement:  Good  Insight:  Good  Psychomotor Activity:  Normal  Concentration:  Good  Recall:  Good  Fund of Knowledge:Good  Language: Good  Akathisia:  No  Handed:  Right  AIMS (if indicated):      Assets:  Desire for Improvement  ADL's:  Intact  Cognition: WNL  Sleep:      Treatment Plan Summary: At this time we will asked this patient to discontinue the trazodone. We will begin him on Belsomr 10 mg at night. The patient does not sleep better after 1 week will ask him to increase it to taking 2 of them. The other potential medication interventions includes doxepin before I would order that I would speech was cardiologist first about taking it. At this time the patient will begin valerian root 450 mg at night that'll get from a natural food store. The best treatment for this patient would be cognitive behavioral therapy but this is not immediately available as best I understand. The patient will call back his sleep specialist about getting his sleep study. Once again in my opinion the patient does not have a distinct anxiety disorder. His anxiety experiences all his life as part his character. Other possible interventions for sleep: Include Neurontin. This patient is not suicidal. He is stable in every other way. He does not drink any alcohol. He'll return to see me in 5 weeks.   Gentri Guardado, [place] 02/10/2015, 9:32 AM

## 2015-02-13 ENCOUNTER — Telehealth (HOSPITAL_COMMUNITY): Payer: Self-pay

## 2015-02-13 DIAGNOSIS — F419 Anxiety disorder, unspecified: Secondary | ICD-10-CM

## 2015-02-13 DIAGNOSIS — F5105 Insomnia due to other mental disorder: Principal | ICD-10-CM

## 2015-02-14 ENCOUNTER — Telehealth (HOSPITAL_COMMUNITY): Payer: Self-pay | Admitting: *Deleted

## 2015-02-14 NOTE — Telephone Encounter (Signed)
Prior authorization for Belsomra received. Called [phone] was told it will be sent for review and decision faxed. Order was inconsistent with quantity prescribed. It states 20mg  twice daily with quantity of 60 tablets for 30 days with 10mg  pills given. Will notify MD.

## 2015-02-14 NOTE — Telephone Encounter (Signed)
Telephone call with patient to inform Claudius Sis, RN had sent in patient's prior authorization request for Besomra but it may take a few days for a response.  Patient reported he had been reading about this medication and thought Ambien, which had worked for him in the past would be a better medication and requested I ask Dr.Plovsky would he try putting him back on Ambien.  Patient stated Trazodone never helped but Ambien at 10mg  at night was helpful for sleep.  Agreed to send request to Dr. Casimiro Needle and will follow up with him once discussed with Dr. Casimiro Needle.

## 2015-02-15 MED ORDER — DOXEPIN HCL 25 MG PO CAPS: 25.0000 mg | ORAL_CAPSULE | Freq: Every day | ORAL | Status: DC

## 2015-02-15 NOTE — Telephone Encounter (Signed)
Met with Dr. Casimiro Needle to inform patient is still waiting to see if Belsomra will be approved by his insurance, states Trazodone does not help and discussed his request to return to taking past used Ambien.  Dr. Casimiro Needle reviewed record and patient history and preferred Doxepin 25 mg, one a bedtime.  Called patient to review the medication with patient and he stated desire to try this medication.  Patient to discontinue Trazodone, still awaiting Belsomra approval but will try low dose Doxepin at this time for sleep.  If medication works, patient agreed he would not start [place] and stated he understood he could only take one of these medications at a time.  Patient agreed to discontinue Trazodone and e-scribed in order for Doxepin 12m, one at bedtime as instructed by Dr. PCasimiro Needleto patient's [place] Patient to call back if any problems with medication.

## 2015-02-17 ENCOUNTER — Telehealth (HOSPITAL_COMMUNITY): Payer: Self-pay

## 2015-02-17 DIAGNOSIS — F419 Anxiety disorder, unspecified: Secondary | ICD-10-CM

## 2015-02-17 DIAGNOSIS — F5105 Insomnia due to other mental disorder: Principal | ICD-10-CM

## 2015-02-17 NOTE — Telephone Encounter (Signed)
pt called pt states he is very upset wtih the doctor, because we have a list of his medications and his cardiologist states that he should not be put on doxepin 25mg  because it will put him back into afib.  pt states that cardiio dr. stated that he can try ambien instead .

## 2015-02-23 MED ORDER — ZOLPIDEM TARTRATE 10 MG PO TABS: 10.0000 mg | ORAL_TABLET | Freq: Every evening | ORAL | Status: DC | PRN

## 2015-02-23 NOTE — Telephone Encounter (Signed)
Telephone call with patient after discussing with Dr. Casimiro Needle 02/22/15 pm to offer patient a change to Ambien 10 mg, one at bedtime as patient has reported this in the past.  Dr. Casimiro Needle reviewed patient's record 02/22/15 and requested we contact patient to discuss.  Patient reported he had received final notice he was not approved for Belsomra and did not want to try Doxepin due to cardiac concerns.  Patient stated he did want to return to taking Ambien and agreed to call in order to his [place] and requested he call back if any problems with restart of medication.  Reminded patient of his next evaluation set for 03/24/15.  Called in one time order of Ambien 10mg , one at bedtime as needed for sleep to Anderson Malta, pharmacist at [facility] and discontinued Belsomra and Doxepin as ordered.

## 2015-03-24 ENCOUNTER — Ambulatory Visit (INDEPENDENT_AMBULATORY_CARE_PROVIDER_SITE_OTHER): Payer: Medicare HMO | Admitting: Psychiatry

## 2015-03-24 VITALS — BP 132/76 | HR 64 | Ht 77.0 in | Wt 220.2 lb

## 2015-03-24 DIAGNOSIS — F5104 Psychophysiologic insomnia: Secondary | ICD-10-CM

## 2015-03-24 DIAGNOSIS — F419 Anxiety disorder, unspecified: Secondary | ICD-10-CM | POA: Diagnosis not present

## 2015-03-24 DIAGNOSIS — F5105 Insomnia due to other mental disorder: Secondary | ICD-10-CM

## 2015-03-24 MED ORDER — ZOLPIDEM TARTRATE ER 12.5 MG PO TBCR
12.5000 mg | EXTENDED_RELEASE_TABLET | Freq: Every evening | ORAL | Status: DC | PRN
Start: 2015-03-24 — End: 2015-05-26

## 2015-03-24 NOTE — Progress Notes (Signed)
[facility] MD Progress Note  03/24/2015 9:21 AM [name]  MRN:  [id number] Subjective: Doing better because he sleeping Principal Problem: Primary insomnia Diagnosis:  Primary insomnia Unfortunately the patient could not get Belsomra in a timely fashion. At that point we therefore began him on Ambien 10 mg which she says works very well. He's gone from sleeping only 4 hours to getting 7 hours and feels much much better. His problem is initiating sleep. He takes in 2-3 hours to finally fall off to sleep. This is been the case since 2013 following bypass surgery and multiple cardiovascular procedures. He simply cannot fall off to sleep. He automatically wakes up at about 4:00 regardless of what tiny dose today and if he went to bed late again 12 getting only 4 hours of sleep he feels miserable. Today's that he feels much better because he is getting at least 7 hours of sleep although he is used to more like 8 hours. This patient denies the use of alcohol or drugs. He denies being depressed. He denies any specific anxiety symptoms. He was to get a sleep study but there was some cancellation and scheduling issues. Today we called the office and connected with the [facility], [name] that are doing the sleep study. He will be getting an appointment in the next 2 weeks. He will have a sleep study. It is condition is primary insomnia the best treatment would be some form of cognitive behavioral therapy which simply is not available in this community. Psychiatrically the patient is very stable. He denies shortness of breath or chest pain. Nothing is waking him up. I've asked him to stop watching TV in bed and not to say that longer than 20 minutes if he can't sleep. At this time this is not a problem after taking Ambien 10 mg she goes right to sleep. Patient Active Problem List   Diagnosis Date Noted  . TI (tricuspid incompetence) [I07.1] 07/28/2014  . Benign essential HTN [I10] 07/22/2014  .  Arteriosclerosis of coronary artery [I25.10] 02/10/2014  . MI (mitral incompetence) [I34.0] 02/10/2014  . Persistent atrial fibrillation ([name]) [I48.1] 02/10/2014  . Constipation [K59.00] 01/10/2014  . Pulmonary nodule, left [R91.1] 11/29/2013  . Anxiety and depression [F41.8] 07/27/2013  . Chest pain, unspecified [R07.9] 07/27/2013  . History of alcoholism ([name]) [F10.21] 07/27/2013  . History of tobacco use [Z87.891] 07/27/2013  . 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 H/O coronary artery bypass surgery [Z95.1] 07/27/2013  . Breath shortness [R06.02] 07/27/2013  . Status post hemorrhoidectomy Dec 2014 [Z98.890, Z87.19] 03/10/2013  . Hx of CABG [Z95.1] 01/22/2013  . Insomnia [G47.00] 09/30/2012  . History of hepatitis B [Z86.19] 09/09/2012  . Colon cancer ([place]) [C18.9] 07/28/2012  . Thrombocytopenia ([place]) [D69.6] 11/04/2011  . Chest pain [R07.9] 11/04/2011  . Chronic systolic heart failure ([place]) [I50.22] 11/03/2011  . 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 Alcohol abuse [F10.10] 01/18/2009  . TOBACCO ABUSE [F17.200] 01/18/2009  . DEPRESSION [F32.9] 01/17/2009   Total Time spent with patient: 30 minutes  Past Psychiatric History:   Past Medical History:  Past Medical History  Diagnosis Date  . Persistent atrial fibrillation ([name])     diagnosed 2010  . 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 03/21/2011 - 2D Echo: EF 25% to 30%. Diffuse hypokinesis.  Marland Kitchen HEMORRHOIDS 01/17/2009  . ALCOHOL ABUSE 01/18/2009  . Insomnia     TAKES ATIVAN TO SLEEP  .  Colon cancer ([name])   . Hepatitis     hepatitis B-never treated-Titers were low  . Coronary artery disease     s/p CABG  . HTN (hypertension)   . HLD (hyperlipidemia)   . Depression   . Anxiety   . H/O alcohol abuse   . Rhonchi   . Fatigue   . Left shoulder pain   . A-fib [name])     Past Surgical History  Procedure Laterality Date  . Mouth surgery    .  Cardiac catheterization    . Colonoscopy N/A 02/11/2013    Procedure: COLONOSCOPY;  Surgeon: Jeffrey Banister, MD;  Location: [facility];  Service: Endoscopy;  Laterality: N/A;  . Colon surgery  07/2012    COLON CANCER  . Coronary artery bypass graft      triple  bypass OCT 2013 in [place] [name]  . Hemorrhoid surgery N/A 03/09/2013    Procedure: HEMORRHOIDECTOMY;  Surgeon: Jeffrey Earls, MD;  [facility]: [facility];  Service: General;  Laterality: N/A;  . Flexible sigmoidoscopy N/A 12/16/2013    Procedure: [name];  Surgeon: Jeffrey Banister, MD;  Location: [facility];  Service: Endoscopy;  Laterality: N/A;  . Electrophysiologic study N/A 08/30/2014    Procedure: CARDIOVERSION;  Surgeon: Jeffrey Skains, MD;  Location: [name];  Service: Cardiovascular;  Laterality: N/A;  . Electrophysiologic study N/A 09/29/2014    Procedure: CARDIOVERSION;  Surgeon: Jeffrey Skains, MD;  Location: [name];  Service: Cardiovascular;  Laterality: N/A;   Family History:  Family History  Problem Relation Age of Onset  . Heart disease Father   . Asthma Mother   . Dementia Mother   . Colon cancer Sister 26   Family Psychiatric  History:  Social History:  History  Alcohol Use  . 0.0 oz/week  . 0 Standard drinks or equivalent per week    Comment: 2 drinks per month, previously heavy     History  Drug Use No    Social History   Social History  . Marital Status: Divorced    Spouse Name: N/A  . Number of Children: 2  . Years of Education: 12   Occupational History  . RetiredAnimal nutritionist    .     Social History Main Topics  . Smoking status: Current Every Day Smoker -- 0.75 packs/day for 50 years    Types: Cigarettes  . Smokeless tobacco: Never Used     Comment: tobacco info given 01/07/14  . Alcohol Use: 0.0 oz/week    0 Standard drinks or equivalent per week     Comment: 2 drinks per month, previously heavy  . Drug Use: No  . Sexual Activity: No   Other Topics Concern   . 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   Has moved homes x 3 in 2 months   States cannot always afford meds-See PI   Drinks 2 cups of coffee a day    Additional Social History:                         Sleep: Fair  Appetite:  Good  Current Medications: Current Outpatient Prescriptions  Medication Sig Dispense Refill  . amiodarone (PACERONE) 200 MG tablet Take one tablet by mouth twice daily (Patient taking differently: 400 mg daily. Take one tablet by mouth twice daily) 60  tablet 3  . apixaban (ELIQUIS) 5 MG TABS tablet Take 5 mg by mouth 2 (two) times daily.    Marland Kitchen LORazepam (ATIVAN) 2 MG tablet 1 qhs  For  20month then  Half qhs for 2weeks 45 tablet 0  . metoprolol succinate (TOPROL-XL) 50 MG 24 hr tablet Take 1 tablet (50 mg total) by mouth daily. Take with or immediately following a meal. 30 tablet 6  . zolpidem (AMBIEN CR) 12.5 MG CR tablet Take 1 tablet (12.5 mg total) by mouth at bedtime as needed for sleep. 30 tablet 4  . zolpidem (AMBIEN) 10 MG tablet Take 1 tablet (10 mg total) by mouth at bedtime as needed for sleep. 30 tablet 0   No current facility-administered medications for this visit.    Lab Results: No results found for this or any previous visit (from the past 48 hour(s)).  Physical Findings: AIMS:  , ,  ,  ,    CIWA:    COWS:     Musculoskeletal: Strength & Muscle Tone: within normal limits Gait & Station: normal Patient leans: N/A  Psychiatric Specialty Exam: ROS  Blood pressure 132/76, pulse 64, height 6\' 5"  (1.956 m), weight 220 lb 3.2 oz (99.882 kg).Body mass index is 26.11 kg/(m^2).  General Appearance: Casual  Eye Contact::  Good  Speech:  Clear and Coherent  Volume:  Normal  Mood:  NA  Affect:  Congruent  Thought Process:  Coherent  Orientation:  Full (Time, Place, and Person)  Thought Content:  WDL  Suicidal Thoughts:  No   Homicidal Thoughts:  No  Memory:  NA  Judgement:  Good  Insight:  Good  Psychomotor Activity:  Normal  Concentration:  Fair  Recall:  Good  Fund of Knowledge:Good  Language: Good  Akathisia:  No  Handed:  Right  AIMS (if indicated):     Assets:  Communication Skills  ADL's:  Intact  Cognition: WNL  Sleep:      Treatment Plan Summary: At this time his primary problem is primary insomnia. Today again we reviewed good sleep hygiene. Today again we helped him get a sleep study just to rule out any other causes of sleep disorder. [place] get a sleep specialist away in 1 to status. His primary care doctor was resistant to giving him 2 mg of Ativan for sleep. I think in some ways Ambien 10 mg is actually safer. It actually her and causes no cognitive disturbances at all. He's taking it very appropriately. He knows when he takes any muscular right event and he doesn't drink any alcohol at all. Today I went ahead and change his Ambien to taking 12.5 mg CR because he would like to sleep just a bit later. I think this is reasonable. He calls back and says he is not falling off to sleep well enough we'll go back to 10 mg of Ambien. This patient is going to return to see [place] in 9 weeks for a 15 minute visit. This patient denies any neurological symptoms. He is physically very healthy.  [place], [place] 03/24/2015, 9:21 AM

## 2015-03-27 ENCOUNTER — Telehealth: Payer: Self-pay | Admitting: Neurology

## 2015-03-27 DIAGNOSIS — Z951 Presence of aortocoronary bypass graft: Secondary | ICD-10-CM

## 2015-03-27 DIAGNOSIS — G479 Sleep disorder, unspecified: Secondary | ICD-10-CM

## 2015-03-27 DIAGNOSIS — G4761 Periodic limb movement disorder: Secondary | ICD-10-CM

## 2015-03-27 DIAGNOSIS — I480 Paroxysmal atrial fibrillation: Secondary | ICD-10-CM

## 2015-03-27 NOTE — Telephone Encounter (Signed)
Order is in.

## 2015-03-27 NOTE — Telephone Encounter (Signed)
Patient called to schedule a sleep study.  The patient did have an order in October however the order has left my workqueue and I need one to attach to the sleep study we have schedule now.  Thanks

## 2015-04-23 ENCOUNTER — Ambulatory Visit (INDEPENDENT_AMBULATORY_CARE_PROVIDER_SITE_OTHER): Payer: Medicare HMO | Admitting: Neurology

## 2015-04-23 DIAGNOSIS — G479 Sleep disorder, unspecified: Secondary | ICD-10-CM

## 2015-04-23 DIAGNOSIS — G4761 Periodic limb movement disorder: Secondary | ICD-10-CM

## 2015-04-23 DIAGNOSIS — G4720 Circadian rhythm sleep disorder, unspecified type: Secondary | ICD-10-CM

## 2015-04-23 NOTE — Sleep Study (Signed)
Please see the scanned sleep study interpretation located in the Procedure tab within the Chart Review section. 

## 2015-04-24 ENCOUNTER — Telehealth: Payer: Self-pay | Admitting: Neurology

## 2015-04-24 NOTE — Telephone Encounter (Signed)
Patient referred by Dr. Kristie Cowman, seen by me on 12/12/14, diagnostic PSG on 04/23/15.   Please call and notify the patient that the recent sleep study did not show any significant obstructive sleep apnea, however, he had significant leg twitching, which is often seen in patients with RLS. Please inform patient that I would like to go over the details of the study during a follow up appointment. Arrange a followup appointment. Also, route or fax report to PCP and referring MD, if other than PCP.  Once you have spoken to patient, you can close this encounter.   Thanks,  Star Age, MD, PhD [facility])

## 2015-04-24 NOTE — Telephone Encounter (Signed)
I spoke to patient and he is aware of results and recommendations. We were able to make an appt for patient tomorrow.

## 2015-04-25 ENCOUNTER — Telehealth: Payer: Self-pay | Admitting: Neurology

## 2015-04-25 ENCOUNTER — Encounter: Payer: Self-pay | Admitting: Neurology

## 2015-04-25 ENCOUNTER — Ambulatory Visit (INDEPENDENT_AMBULATORY_CARE_PROVIDER_SITE_OTHER): Payer: Medicare HMO | Admitting: Neurology

## 2015-04-25 VITALS — BP 117/76 | HR 57 | Resp 16 | Ht 77.0 in | Wt 220.0 lb

## 2015-04-25 DIAGNOSIS — G479 Sleep disorder, unspecified: Secondary | ICD-10-CM

## 2015-04-25 DIAGNOSIS — G4761 Periodic limb movement disorder: Secondary | ICD-10-CM | POA: Diagnosis not present

## 2015-04-25 MED ORDER — ROPINIROLE HCL 0.25 MG PO TABS: ORAL_TABLET | ORAL | Status: DC

## 2015-04-25 NOTE — Telephone Encounter (Signed)
See note from 04/24/15.

## 2015-04-25 NOTE — Progress Notes (Signed)
Subjective:    Patient ID: [name] is a 70 y.o. male.  HPI     Interim history:  [name] is a 70 year old right-handed gentleman with an underlying medical history of low back pain, overweight state, history of pneumonia, arthritis, paroxysmal atrial fibrillation (s/p multimple cardioversions), anxiety, depression, history of hepatitis B, coronary artery disease, status post CABG, colon cancer (s/p partical colectomy in 2014, no chemo needed), hyperlipidemia, hypertension, smoking (1 ppd, over 50 years), and prior alcohol abuse, with relapse in 2016, who presents for follow-up consultation of his sleep disturbance, after his recent sleep study. The patient is accompanied by his wife today. I first met him on 12/12/2014 at the request of his primary care physician, at which time he reported difficulty with his sleep, including difficulty initiating sleep, some restless leg symptoms and leg twitching at night. I invited him back for sleep study. He had a baseline sleep study on 03/23/2015. I went over his test results with him in detail today. Sleep efficiency was reduced mildly at 76.3% with a prolonged sleep latency of 49 minutes and wake after sleep onset of 59.5 minutes with moderate to severe sleep fragmentation noted. He had an elevated arousal index, an increased percentage of light stage sleep with stage I at 19.9% in stage II at 63.9% respectively, absence of slow-wave sleep and a decreased percentage of REM sleep at 16.2% with a REM latency of 116.5 minutes. He had severe PLMS with an index of 92 per hour, resulting in 12.9 arousals per hour. He had no significant EKG changes or EEG changes. He had minimal intermittent snoring. Total AHI was 1 per hour, 3.2 per hour during REM sleep and 2.1 per hour in the supine position. Average oxygen saturation was 93%, nadir was 88%.  Today, 04/25/2015: He reports feeling about the same, no clear or recent RLS symptoms, but his wife endorses that he  twitches his legs every night, quite significantly. He is a restless sleeper. He tosses and turns a lot.  Previously:  12/12/2014: He reports difficulty with his sleep, for years. He has no significant snoring, no significant difficulty maintaining sleep. He has rare RLS symptoms and has been told he kicks in his sleep. He lives with [name]. He has not woken up with any sense of gasping for air. Once he is asleep he feels he is not disturbed. He wakes up fairly adequately rested. His Epworth sleepiness score is 2 out of 24 today, his fatigue scores 22 out of 63. Bedtime is around 9:30. He takes 2 mg of Ativan. It makes him sleepy after 30-45 minutes. He watches TV in bed but turns it off. There is no pet in the bed. She shares his bed. He has 2 cups of coffee in the mornings and 2 glasses of ice tea during the day and ginger ale at night. He does not drink a lot of water. He drinks 6 beers per month on average. He is going to start Chantix for smoking cessation soon. For sleep he has tried in the past over-the-counter medication including melatonin and p.m. type medications. At one time he was on Ativan and Ambien together but stopped the Ambien. Originally, his Ativan was started for anxiety, particularly prior to his heart surgery. He has not seen a psychiatrist for anxiety management.  I reviewed your office note from 11/01/2014 which you kindly included. Also reviewed the office note from Dr. Joylene Grapes, his cardiologist, from 08/12/2014. He has been on Ativan for  anxiety for years, since 2012.   His Past Medical History Is Significant For: Past Medical History  Diagnosis Date  . Persistent atrial fibrillation ([place])     diagnosed 2010  . 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 03/21/2011 - 2D Echo: EF 25% to 30%. Diffuse hypokinesis.  Marland Kitchen HEMORRHOIDS 01/17/2009  . ALCOHOL ABUSE 01/18/2009  . Insomnia     TAKES ATIVAN TO SLEEP  . Colon cancer ([place])   . Hepatitis     hepatitis  B-never treated-Titers were low  . Coronary artery disease     s/p CABG  . HTN (hypertension)   . HLD (hyperlipidemia)   . Depression   . Anxiety   . H/O alcohol abuse   . Rhonchi   . Fatigue   . Left shoulder pain   . A-fib [facility])     His Past Surgical History Is Significant For: Past Surgical History  Procedure Laterality Date  . Mouth surgery    . Cardiac catheterization    . Colonoscopy N/A 02/11/2013    Procedure: COLONOSCOPY;  Surgeon: Milus Banister, MD;  [facility]: [facility];  Service: Endoscopy;  Laterality: N/A;  . Colon surgery  07/2012    COLON CANCER  . Coronary artery bypass graft      triple  bypass OCT 2013 in [place]  . Hemorrhoid surgery N/A 03/09/2013    Procedure: HEMORRHOIDECTOMY;  Surgeon: Pedro Earls, MD;  Location: [facility] ORS;  Service: General;  Laterality: N/A;  . Flexible sigmoidoscopy N/A 12/16/2013    Procedure: Beryle Quant;  Surgeon: Milus Banister, MD;  [facility];  Service: Endoscopy;  Laterality: N/A;  . Electrophysiologic study N/A 08/30/2014    Procedure: CARDIOVERSION;  Surgeon: [name], MD;  Location: [facility] ORS;  Service: Cardiovascular;  Laterality: N/A;  . Electrophysiologic study N/A 09/29/2014    Procedure: CARDIOVERSION;  Surgeon: [name], MD;  Location: [facility] ORS;  Service: Cardiovascular;  Laterality: N/A;    His Family History Is Significant For: Family History  Problem Relation Age of Onset  . Heart disease Father   . Asthma Mother   . Dementia Mother   . Colon cancer Sister 46    His Social History Is Significant For: Social History   Social History  . Marital Status: Divorced    Spouse Name: N/A  . Number of Children: 2  . Years of Education: 12   Occupational History  . RetiredAnimal nutritionist    .     Social History Main Topics  . Smoking status: Current Every Day Smoker -- 0.75 packs/day for 50 years    Types: Cigarettes  . Smokeless tobacco: Never Used      Comment: tobacco info given 01/07/14  . Alcohol Use: 0.0 oz/week    0 Standard drinks or equivalent per week     Comment: 2 drinks per month, previously heavy  . Drug Use: No  . Sexual Activity: No   Other Topics Concern  . 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   Has moved homes x 3 in 2 months   States cannot always afford meds-See PI   Drinks 2 cups of coffee a day     His Allergies Are:  No Known Allergies:   His Current Medications Are:  Outpatient Encounter Prescriptions as of 04/25/2015  Medication Sig  .  amiodarone (PACERONE) 200 MG tablet Take one tablet by mouth twice daily (Patient taking differently: 400 mg daily. Take one tablet by mouth twice daily)  . apixaban (ELIQUIS) 5 MG TABS tablet Take 5 mg by mouth 2 (two) times daily.  . metoprolol succinate (TOPROL-XL) 50 MG 24 hr tablet Take 1 tablet (50 mg total) by mouth daily. Take with or immediately following a meal.  . zolpidem (AMBIEN CR) 12.5 MG CR tablet Take 1 tablet (12.5 mg total) by mouth at bedtime as needed for sleep.  . [DISCONTINUED] LORazepam (ATIVAN) 2 MG tablet 1 qhs  For  9monththen  Half qhs for 2weeks  . [DISCONTINUED] zolpidem (AMBIEN) 10 MG tablet Take 1 tablet (10 mg total) by mouth at bedtime as needed for sleep.   No facility-administered encounter medications on file as of 04/25/2015.  :  Review of Systems:  Out of a complete 14 point review of systems, all are reviewed and negative with the exception of these symptoms as listed below:  Review of Systems  Neurological:       Patient is here to discuss sleep study, no new concerns.     Objective:  Neurologic Exam  Physical Exam Physical Examination:   Filed Vitals:   04/25/15 1432  BP: 117/76  Pulse: 57  Resp: 16   General Examination: The patient is a very pleasant 70y.o. male in no acute distress. He appears well-developed and  well-nourished and adequately groomed.   HEENT: Normocephalic, atraumatic, pupils are equal, round and reactive to light and accommodation. Extraocular tracking is good without limitation to gaze excursion or nystagmus noted. Normal smooth pursuit is noted. Hearing is grossly intact. Face is symmetric with normal facial animation and normal facial sensation. Speech is clear with no dysarthria noted. There is no hypophonia. There is no lip, neck/head, jaw or voice tremor. Neck is supple with full range of passive and active motion. There are no carotid bruits on auscultation. Oropharynx exam reveals: mild to moderate mouth dryness, adequate dental hygiene and moderate airway crowding, due to redundant soft palate and large uvula. Tonsils are small, could be absent? The patient reports no tonsillectomy. Mallampati is class II. Tongue protrudes centrally and palate elevates symmetrically.     Chest: Clear to auscultation without wheezing, rhonchi or crackles noted.  Heart: S1+S2+0, regular and normal without murmurs, rubs or gallops noted.   Abdomen: Soft, non-tender and non-distended with normal bowel sounds appreciated on auscultation.  Extremities: There is no pitting edema in the distal lower extremities bilaterally. Pedal pulses are intact.  Skin: Warm and dry without trophic changes notedin the upper extremities. There are no varicose veins. Unremarkable scar in the left forearm from blood vessel harvesting is noted.  Musculoskeletal: exam reveals no obvious joint deformities, tenderness or joint swelling or erythema.   Neurologically:  Mental status: The patient is awake, alert and oriented in all 4 spheres. His immediate and remote memory, attention, language skills and fund of knowledge are appropriate. There is no evidence of aphasia, agnosia, apraxia or anomia. Speech is clear with normal prosody and enunciation. Thought process is linear. Mood is normal and affect is blunted.  Cranial  nerves II - XII are as described above under HEENT exam. In addition: shoulder shrug is normal with equal shoulder height noted. Motor exam: Normal bulk, strength and tone is noted. There is no drift, tremor or rebound. Romberg is negative, except slight sway. Reflexes are 1+ throughout, but absent in both ankles. Fine motor  skills and coordination: intact with normal finger taps, normal hand movements, normal rapid alternating patting, normal foot taps and normal foot agility.  Cerebellar testing: No dysmetria or intention tremor on finger to nose testing. Heel to shin is unremarkable bilaterally. There is no truncal or gait ataxia.  Sensory exam: intact to light touch in the upper and lower extremities.  Gait, station and balance: He stands easily. No veering to one side is noted. No leaning to one side is noted. Posture is age-appropriate and stance is narrow based. Gait shows normal stride length and normal pace. No problems turning are noted. He turns en bloc, however tandem walk is challenging for him, unchanged.               Assessment and Plan:  In summary, REQUAN HARDGE is a very pleasant 71 year old male with an underlying medical history of low back pain, overweight state, history of pneumonia, arthritis, paroxysmal atrial fibrillation (s/p multimple cardioversions), anxiety, depression, history of hepatitis B, coronary artery disease, status post CABG, colon cancer (s/p partical colectomy in 2014, no chemo needed), hyperlipidemia, hypertension, smoking (1 ppd, over 50 years), and prior alcohol abuse, with relapse earlier in 2016, who presents for follow-up consultation of his sleep disturbance, particularly difficulty falling asleep of many years duration. He had a recent sleep study on 04/23/2015 and I went over his test results with him and his wife today. Thankfully, he does not have any significant sleep disordered breathing. He had significant leg twitching, sleep disruption, increase in  light stage sleep, absence of deep sleep, decrease in rapid eye movement sleep, and PLM associated arousals. His sleep disturbance may be secondary to several factors but may be exacerbated by underlying PLMD. He does not endorse much in the way of RLS symptoms at this time. Nevertheless, I think he may benefit from symptomatic treatment of his PLMD, in the hope that his sleep may be more consolidated and easier to achieve and he may wake up rested. To that end, I suggested a trial of low-dose ropinirole starting with 0.25 mg strength with gradual titration to up to 1 mg each night, taken 90 minutes to 120 minutes before bedtime. I've asked him to keep her bedtime schedule for this. Furthermore, he is advised about potential side effects of this medication including dopamine dysregulation symptoms such as gambling, excessive eating, OCD type symptoms and hypersexuality. I provided him with a new prescription and titration instructions. I would like to see him back in about 3 months, sooner if needed. I encouraged him to call with any interim questions or concerns.  I spent 25 minutes in total face-to-face time with the patient, more than 50% of which was spent in counseling and coordination of care, reviewing test results, reviewing medication and discussing or reviewing the diagnosis of PLMD, its prognosis and treatment options.

## 2015-04-25 NOTE — Patient Instructions (Addendum)
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 While this is typically associated with symptoms of restless leg syndrome, patients can have periodic leg movements, that is, repetitive twitching in their sleep secondary to medication effects, particularly from taking an SSRI type antidepressant. Sometimes changing the antidepressant regimen can help. If this leg movement disorder disrupts your sleep, it can cause other problems such as daytime sleepiness. Please do not drive if you feels sleepy. Sometimes other conditions can cause leg twitching at night including thyroid dysfunction and iron deficiency. There are some symptomatic medications available for treatment. At this point, I would like for you to try Requip (generic name: ropinirole) 0.25 mg: Take one pill each night 1 week, then 2 pills each night for 1 week, then 3 pills each night for 1 week, then 4 pills each night thereafter. Take 90-120 minutes before projected bedtime. Common side effects reported are: Sedation, sleepiness, nausea, vomiting, and rare side effects are confusion, hallucinations, swelling in legs, and abnormal behaviors, including impulse control problems, which can manifest as excessive eating, obsessions with food or gambling, or hypersexuality.

## 2015-05-12 ENCOUNTER — Ambulatory Visit (HOSPITAL_COMMUNITY): Payer: Self-pay | Admitting: Psychiatry

## 2015-05-26 ENCOUNTER — Ambulatory Visit (INDEPENDENT_AMBULATORY_CARE_PROVIDER_SITE_OTHER): Payer: Medicare HMO | Admitting: Psychiatry

## 2015-05-26 VITALS — BP 118/84 | HR 98 | Ht 77.0 in | Wt 223.4 lb

## 2015-05-26 DIAGNOSIS — G4701 Insomnia due to medical condition: Secondary | ICD-10-CM | POA: Diagnosis not present

## 2015-05-26 DIAGNOSIS — G2581 Restless legs syndrome: Secondary | ICD-10-CM

## 2015-05-26 MED ORDER — ZOLPIDEM TARTRATE ER 12.5 MG PO TBCR: 12.5000 mg | EXTENDED_RELEASE_TABLET | Freq: Every evening | ORAL | Status: DC | PRN

## 2015-05-26 NOTE — Progress Notes (Signed)
[facility] MD Progress Note  05/26/2015 9:49 AM [name]  MRN:  [id number] Subjective:  Sleeping well Principal Problem: Insomnia Diagnosis:  Primary insomnia The patient is doing well. He change from standard Ambien to Ambien 12.5 mg CR and it's helped a little better. He says he sleeping better more like 5 hours. He has no daytime dysfunction. He is now napping or sleepiness. His mood is good. He is eating well. He's got good energy. He denies the use of alcohol or substances. The patient just got back from a cruise and enjoyed himself. The patient's wife and he are doing well. The patient did finally have his sleep studies done and demonstrated restless leg syndrome. He therefore is on something for restless leg as well as the Ambien. The patient is active and energetic and is enjoying life. He demonstrates no evidence of psychosis. He is no significant mood symptoms at this time. He is not suicidal or homicidal. He takes my medicine as prescribed. Patient Active Problem List   Diagnosis Date Noted  . TI (tricuspid incompetence) [I07.1] 07/28/2014  . Benign essential HTN [I10] 07/22/2014  . Arteriosclerosis of coronary artery [I25.10] 02/10/2014  . MI (mitral incompetence) [I34.0] 02/10/2014  . Persistent atrial fibrillation ([place]) [I48.1] 02/10/2014  . Constipation [K59.00] 01/10/2014  . Pulmonary nodule, left [R91.1] 11/29/2013  . Anxiety and depression [F41.8] 07/27/2013  . Chest pain, unspecified [R07.9] 07/27/2013  . History of alcoholism ([place]) [F10.21] 07/27/2013  . History of tobacco use [Z87.891] 07/27/2013  . 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 H/O coronary artery bypass surgery [Z95.1] 07/27/2013  . Breath shortness [R06.02] 07/27/2013  . Status post hemorrhoidectomy Dec 2014 [Z98.890, Z87.19] 03/10/2013  . Hx of CABG [Z95.1] 01/22/2013  . Insomnia [G47.00] 09/30/2012  . History of hepatitis B [Z86.19] 09/09/2012  . Colon cancer ([place]) [C18.9]  07/28/2012  . Thrombocytopenia ([place]) [D69.6] 11/04/2011  . Chest pain [R07.9] 11/04/2011  . Chronic systolic heart failure ([place]) [I50.22] 11/03/2011  . 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 Alcohol abuse [F10.10] 01/18/2009  . TOBACCO ABUSE [F17.200] 01/18/2009  . DEPRESSION [F32.9] 01/17/2009   Total Time spent with patient: 15 minutes  Past Psychiatric History:    Past Medical History:  Past Medical History  Diagnosis Date  . Persistent atrial fibrillation ([place])     diagnosed 2010  . 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 03/21/2011 - 2D Echo: EF 25% to 30%. Diffuse hypokinesis.  Marland Kitchen HEMORRHOIDS 01/17/2009  . ALCOHOL ABUSE 01/18/2009  . Insomnia     TAKES ATIVAN TO SLEEP  . Colon cancer ([place])   . Hepatitis     hepatitis B-never treated-Titers were low  . Coronary artery disease     s/p CABG  . HTN (hypertension)   . HLD (hyperlipidemia)   . Depression   . Anxiety   . H/O alcohol abuse   . Rhonchi   . Fatigue   . Left shoulder pain   . A-fib [facility])     Past Surgical History  Procedure Laterality Date  . Mouth surgery    . Cardiac catheterization    . Colonoscopy N/A 02/11/2013    Procedure: COLONOSCOPY;  Surgeon: Milus Banister, MD;  [facility];  Service: Endoscopy;  Laterality: N/A;  . Colon surgery  07/2012    COLON CANCER  . Coronary artery bypass graft      triple  bypass OCT 2013 in [place]  .  Hemorrhoid surgery N/A 03/09/2013    Procedure: HEMORRHOIDECTOMY;  Surgeon: Pedro Earls, MD;  [facility];  Service: General;  Laterality: N/A;  . Flexible sigmoidoscopy N/A 12/16/2013    Procedure: Beryle Quant;  Surgeon: Milus Banister, MD;  [facility];  Service: Endoscopy;  Laterality: N/A;  . Electrophysiologic study N/A 08/30/2014    Procedure: CARDIOVERSION;  Surgeon: [name], MD;  Location: [facility] ORS;  Service: Cardiovascular;  Laterality: N/A;  . Electrophysiologic  study N/A 09/29/2014    Procedure: CARDIOVERSION;  Surgeon: [name], MD;  Location: [facility] ORS;  Service: Cardiovascular;  Laterality: N/A;   Family History:  Family History  Problem Relation Age of Onset  . Heart disease Father   . Asthma Mother   . Dementia Mother   . Colon cancer Sister 61   Family Psychiatric  History:   Social History:  History  Alcohol Use  . 0.0 oz/week  . 0 Standard drinks or equivalent per week    Comment: 2 drinks per month, previously heavy     History  Drug Use No    Social History   Social History  . Marital Status: Divorced    Spouse Name: N/A  . Number of Children: 2  . Years of Education: 12   Occupational History  . RetiredAnimal nutritionist    .     Social History Main Topics  . Smoking status: Current Every Day Smoker -- 0.75 packs/day for 50 years    Types: Cigarettes  . Smokeless tobacco: Never Used     Comment: tobacco info given 01/07/14  . Alcohol Use: 0.0 oz/week    0 Standard drinks or equivalent per week     Comment: 2 drinks per month, previously heavy  . Drug Use: No  . Sexual Activity: No   Other Topics Concern  . 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   Has moved homes x 3 in 2 months   States cannot always afford meds-See PI   Drinks 2 cups of coffee a day    Additional Social History:                         Sleep: Good  Appetite:  Good  Current Medications: Current Outpatient Prescriptions  Medication Sig Dispense Refill  . amiodarone (PACERONE) 200 MG tablet Take one tablet by mouth twice daily (Patient taking differently: 400 mg daily. Take one tablet by mouth twice daily) 60 tablet 3  . apixaban (ELIQUIS) 5 MG TABS tablet Take 5 mg by mouth 2 (two) times daily.    . metoprolol succinate (TOPROL-XL) 50 MG 24 hr tablet Take 1 tablet (50 mg total) by mouth daily. Take with or immediately  following a meal. 30 tablet 6  . rOPINIRole (REQUIP) 0.25 MG tablet 1 pill nightly x 1 week, then 2 pills nightly x 1 w, then 3 pills nightly x 1 w, then 4 pills nightly thereafter. 90-120 min before bedtime. 120 tablet 5  . zolpidem (AMBIEN CR) 12.5 MG CR tablet Take 1 tablet (12.5 mg total) by mouth at bedtime as needed for sleep. 30 tablet 5   No current facility-administered medications for this visit.    Lab Results: No results found for this or any previous visit (from the past 48 hour(s)).  Blood Alcohol level:  Lab  Results  Component Value Date   [facility] <11 12/01/2011   ETH 118* 11/03/2011    Physical Findings: AIMS:  , ,  ,  ,    CIWA:    COWS:     Musculoskeletal: Strength & Muscle Tone: within normal limits Gait & Station: normal Patient leans: N/A  Psychiatric Specialty Exam: ROS  Blood pressure 118/84, pulse 98, height 6\' 5"  (1.956 m), weight 223 lb 6.4 oz (101.334 kg).Body mass index is 26.49 kg/(m^2).  General Appearance: Casual  Eye Contact::  Good  Speech:  Clear and Coherent  Volume:  Normal  Mood:  Euthymic  Affect:  Congruent  Thought Process:  Coherent  Orientation:  Full (Time, Place, and Person)  Thought Content:  WDL  Suicidal Thoughts:  No  Homicidal Thoughts:  No  Memory:  NA  Judgement:  Good  Insight:  Good  Psychomotor Activity:  Normal  Concentration:  Good  Recall:  Good  Fund of Knowledge:Good  Language: Good  Akathisia:  No  Handed:  Right  AIMS (if indicated):     Assets:  Communication Skills  ADL's:  Intact  Cognition: WNL  Sleep:      Treatment Plan Summary: At this time the patient will continue taking Ambien 12.5 mg CR. He'll also take medicines for his restless legs. I suspect both of these conditions that of a primary sleep disorder of insomnia and restless legs are somehow connected to each other. Today reviewed the pros and cons of his medications and he agreed to take the. I shared with him it'll probably be a long-term  use of this medicine. A different opportunity would be to get him in cognitive behavioral therapy for insomnia but this is simply not available to him. The patient feels perfectly accepting of taking Ambien 12.5 mg CR for prolonged period of time. He denies any chest pain or shortness of breath. He denies any neurological symptoms at this time. This patient she'll return to see me in 5 months for 15 minute visit.  Haskel Schroeder, MD 05/26/2015, 9:49 AM

## 2015-05-31 ENCOUNTER — Ambulatory Visit (HOSPITAL_COMMUNITY): Payer: Self-pay | Admitting: Psychiatry

## 2015-06-05 ENCOUNTER — Telehealth: Payer: Self-pay | Admitting: Gastroenterology

## 2015-06-05 MED ORDER — POLYETHYLENE GLYCOL 3350 17 GM/SCOOP PO POWD: 1.0000 | Freq: Every day | ORAL | Status: DC

## 2015-06-05 NOTE — Telephone Encounter (Signed)
The pt appt was moved up to 07/28/15 and he was advised to try a capful of miralax daily until the appt.  He was also added to the wait list.  The pt is able to have a bowel movement with Dulcolax but is not able to go unless he takes the dulcolax daily.  He will try the miralax instead and call if that does not help or he stops passing any gas, or stool, or if he develops abd pain, fever or other symptoms.

## 2015-06-05 NOTE — Telephone Encounter (Addendum)
rx sent as requested per patient

## 2015-07-13 ENCOUNTER — Ambulatory Visit: Payer: Medicare HMO | Admitting: Neurology

## 2015-07-28 ENCOUNTER — Encounter: Payer: Self-pay | Admitting: Gastroenterology

## 2015-07-28 ENCOUNTER — Ambulatory Visit (INDEPENDENT_AMBULATORY_CARE_PROVIDER_SITE_OTHER): Payer: Medicare HMO | Admitting: Gastroenterology

## 2015-07-28 VITALS — BP 120/78 | HR 60 | Ht 75.25 in | Wt 220.0 lb

## 2015-07-28 DIAGNOSIS — K5909 Other constipation: Secondary | ICD-10-CM | POA: Diagnosis not present

## 2015-07-28 NOTE — Progress Notes (Signed)
Review of pertinent gastrointestinal problems: 1. Stage I colon cancer diagnosed 2014, treated with surgery only, this was all done elsewhere. Colonoscopy Dr. Ardis Hughs 12 2014 found normal appearing anastomosis at 15 cm from the anus, no polyps. He was recommended to have repeat colonoscopy at 3 year interval. 2. Chronic constipation;   HPI: This is a   very pleasant 70 year old man     who was referred to me by Leamon Arnt, MD  to evaluate  chronic constipation .    Chief complaint is chronic constipation  Continues to have difficulty with his bowels, chronically constipated.   He will phone very full and bloated and had some abdominal discomfort until he finally moves his bowels.  Can go 5-6 days at times between BMs.    Has added fruits, veggies to his diet.  Takes 1 dose of miralax daily.    Alternating constipation, loose stools.  Doesn't recall linzess.  Has not tried fiber supplements  Overall stable weight.  Review of systems: Pertinent positive and negative review of systems were noted in the above HPI section. Complete review of systems was performed and was otherwise normal.   Past Medical History  Diagnosis Date  . Persistent atrial fibrillation ([place])     diagnosed 2010  . 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 03/21/2011 - 2D Echo: EF 25% to 30%. Diffuse hypokinesis.  Marland Kitchen HEMORRHOIDS 01/17/2009  . ALCOHOL ABUSE 01/18/2009  . Insomnia     TAKES ATIVAN TO SLEEP  . Colon cancer ([place])   . Hepatitis     hepatitis B-never treated-Titers were low  . Coronary artery disease     s/p CABG  . HTN (hypertension)   . HLD (hyperlipidemia)   . Depression   . Anxiety   . H/O alcohol abuse   . Rhonchi   . Fatigue   . Left shoulder pain   . A-fib [facility])     Past Surgical History  Procedure Laterality Date  . Mouth surgery    . Cardiac catheterization    . Colonoscopy N/A 02/11/2013    Procedure: COLONOSCOPY;  Surgeon: Milus Banister, MD;   [facility];  Service: Endoscopy;  Laterality: N/A;  . Colon surgery  07/2012    COLON CANCER  . Coronary artery bypass graft      triple  bypass OCT 2013 in [place]  . Hemorrhoid surgery N/A 03/09/2013    Procedure: HEMORRHOIDECTOMY;  Surgeon: Pedro Earls, MD;  Location: [facility];  Service: General;  Laterality: N/A;  . Flexible sigmoidoscopy N/A 12/16/2013    Procedure: Beryle Quant;  Surgeon: Milus Banister, MD;  [facility];  Service: Endoscopy;  Laterality: N/A;  . Electrophysiologic study N/A 08/30/2014    Procedure: CARDIOVERSION;  Surgeon: [name], MD;  Location: [facility] ORS;  Service: Cardiovascular;  Laterality: N/A;  . Electrophysiologic study N/A 09/29/2014    Procedure: CARDIOVERSION;  Surgeon: [name], MD;  Location: [facility] ORS;  Service: Cardiovascular;  Laterality: N/A;    Current Outpatient Prescriptions  Medication Sig Dispense Refill  . amiodarone (PACERONE) 200 MG tablet Take one tablet by mouth twice daily (Patient taking differently: 400 mg daily. Take one tablet by mouth twice daily) 60 tablet 3  . apixaban (ELIQUIS) 5 MG TABS tablet Take 5 mg by mouth 2 (two) times daily.    . metoprolol succinate (TOPROL-XL) 25 MG 24 hr tablet Take 25 mg by mouth daily.    Marland Kitchen  polyethylene glycol powder (GLYCOLAX/MIRALAX) powder Take 255 g by mouth daily. 255 g 0  . zolpidem (AMBIEN CR) 12.5 MG CR tablet Take 1 tablet (12.5 mg total) by mouth at bedtime as needed for sleep. 30 tablet 5   No current facility-administered medications for this visit.    Allergies as of 07/28/2015  . (No Known Allergies)    Family History  Problem Relation Age of Onset  . Heart disease Father   . Asthma Mother   . Dementia Mother   . Colon cancer Sister 52    Social History   Social History  . Marital Status: Divorced    Spouse Name: N/A  . Number of Children: 2  . Years of Education: 12   Occupational History  . RetiredAnimal nutritionist     .     Social History Main Topics  . Smoking status: Current Every Day Smoker -- 0.75 packs/day for 50 years    Types: Cigarettes  . Smokeless tobacco: Never Used     Comment: tobacco info given 01/07/14  . Alcohol Use: 0.0 oz/week    0 Standard drinks or equivalent per week     Comment: 2 drinks per month, previously heavy  . Drug Use: No  . Sexual Activity: No   Other Topics Concern  . 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   Has moved homes x 3 in 2 months   States cannot always afford meds-See PI   Drinks 2 cups of coffee a day      Physical Exam: BP 120/78 mmHg  Pulse 60  Ht 6' 3.25" (1.911 m)  Wt 220 lb (99.791 kg)  BMI 27.33 kg/m2 Constitutional: generally well-appearing Psychiatric: alert and oriented x3 Eyes: extraocular movements intact Mouth: oral pharynx moist, no lesions Neck: supple no lymphadenopathy Cardiovascular: heart regular rate and rhythm Lungs: clear to auscultation bilaterally Abdomen: soft, nontender, nondistended, no obvious ascites, no peritoneal signs, normal bowel sounds Extremities: no lower extremity edema bilaterally Skin: no lesions on visible extremities   Assessment and plan: 70 y.o. male with  Chronic constipation, abdominal discomfort  He is going to add fiber supplements to his daily regimen of MiraLAX. He is going to try to drink 1-2 medium to large glasses of water daily to stay hydrated. He will call back to report on his response to this regimen in 4 weeks. If no significant improvement in his bowels and we are going to add Linzess once daily. I see no reason for any further blood tests or imaging studies prior to then.   [name], MD [facility] 07/28/2015, 10:29 AM  Cc: Leamon Arnt, MD

## 2015-07-28 NOTE — Patient Instructions (Signed)
Continue one dose of miralax every day. Please start taking citrucel (orange flavored) powder fiber supplement.  This may cause some bloating at first but that usually goes away. Begin with a small spoonful and work your way up to a large, heaping spoonful daily over a week. Try to stay hydrated (drink two medium-large glasses of water every day). Call in one month to report on your response. The next step is prescription for linzess (once a day pill). Will confirm recall colonoscopy in 02/2016 for history of colon cancer.

## 2015-07-31 ENCOUNTER — Other Ambulatory Visit: Payer: Self-pay

## 2015-07-31 MED ORDER — POLYETHYLENE GLYCOL 3350 17 GM/SCOOP PO POWD: 17.0000 g | Freq: Every day | ORAL | Status: DC

## 2015-08-09 ENCOUNTER — Ambulatory Visit: Payer: Self-pay | Admitting: Gastroenterology

## 2015-08-25 ENCOUNTER — Other Ambulatory Visit (HOSPITAL_COMMUNITY): Payer: Self-pay | Admitting: Psychiatry

## 2015-08-29 ENCOUNTER — Telehealth: Payer: Self-pay | Admitting: Gastroenterology

## 2015-08-31 NOTE — Telephone Encounter (Signed)
Left message on machine to call back  

## 2015-09-01 NOTE — Telephone Encounter (Signed)
Left message on machine to call back  

## 2015-09-01 NOTE — Telephone Encounter (Signed)
Unable to reach the pt will wait for further communication from the pt

## 2015-09-20 IMAGING — CT CT ABD-PELV W/ CM
2 of 5 series · 16 of 46 positions shown, 18 images · IV contrast (APPLIED)
Comparison: None.

CLINICAL DATA: Right lower quadrant pain and rectal bleeding.
Constipation. History of colon cancer with prior surgery. Dilated
cardiomyopathy secondary to alcohol. Hepatitis. Hypertension.

EXAM:
CT ABDOMEN AND PELVIS WITH CONTRAST
TECHNIQUE: Multidetector CT imaging of the abdomen and pelvis was performed
using the standard protocol following bolus administration of
intravenous contrast.
CONTRAST:  100mL OMNIPAQUE IOHEXOL 300 MG/ML  SOLN

[Series 2: abd/ pelvis 5.0 i30f 1 · axial · 0.81mm/px · z∈[-451,-6]mm · 13 of 101 slices shown, 15 images]
[im 6/101  soft-tissue]
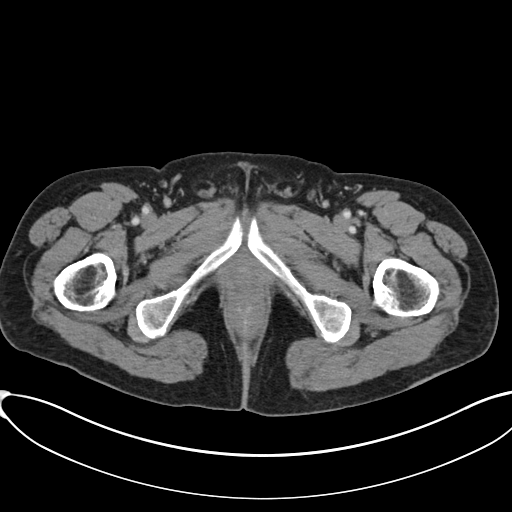
[im 6/101  bone]
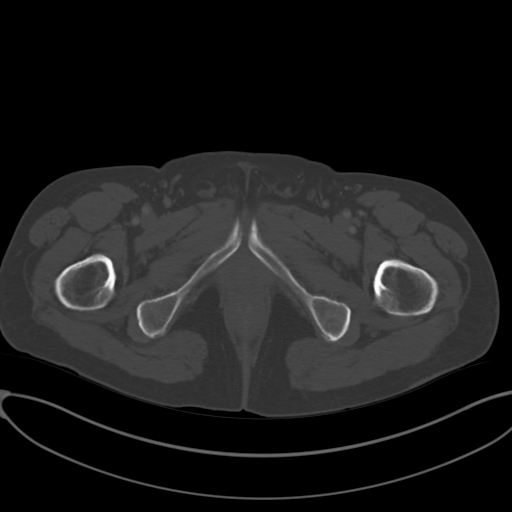
[im 16/101  soft-tissue]
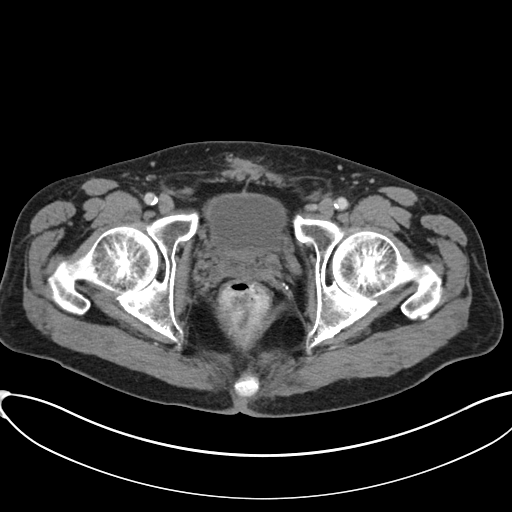
[im 22/101  soft-tissue]
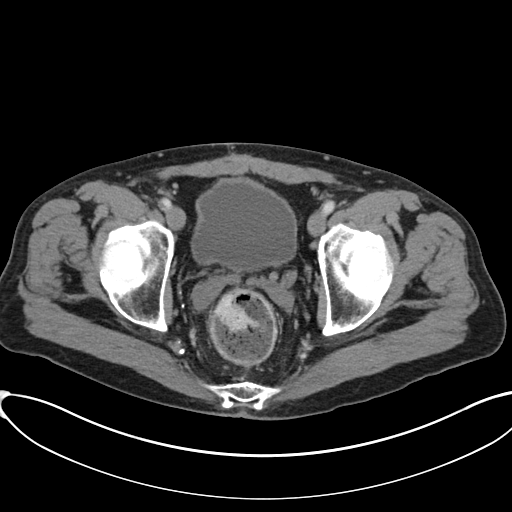
[im 27/101  soft-tissue]
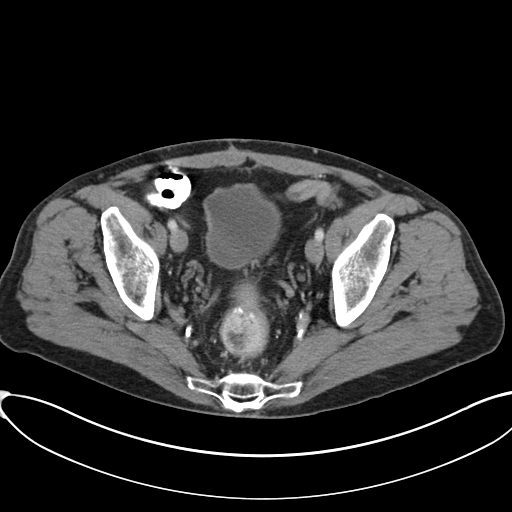
[im 37/101  soft-tissue]
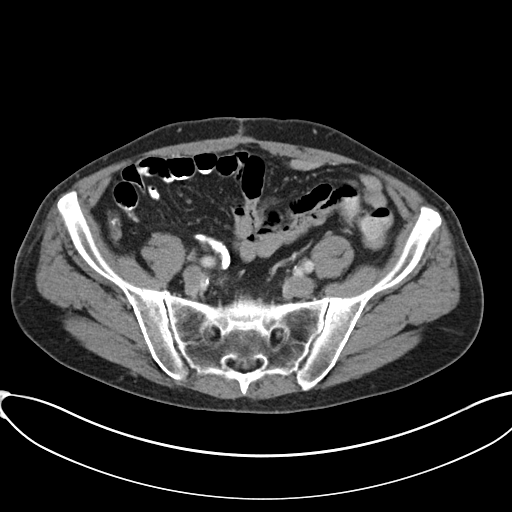
[im 43/101  soft-tissue]
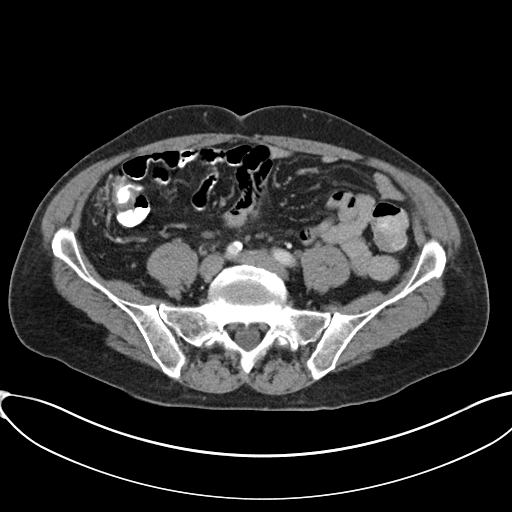
[im 53/101  soft-tissue]
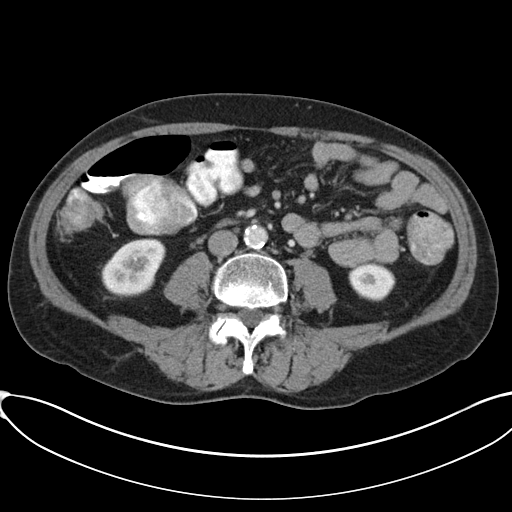
[im 58/101  soft-tissue]
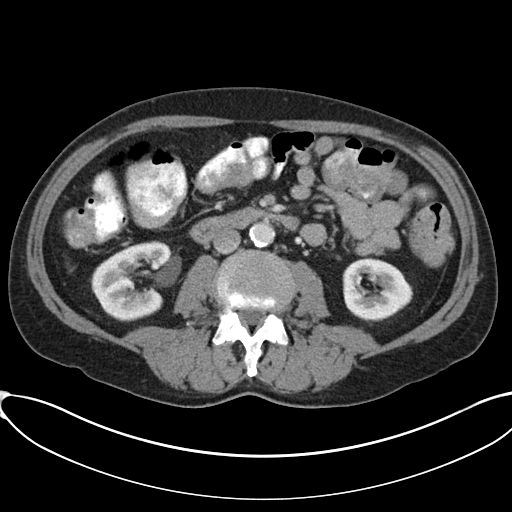
[im 64/101  soft-tissue]
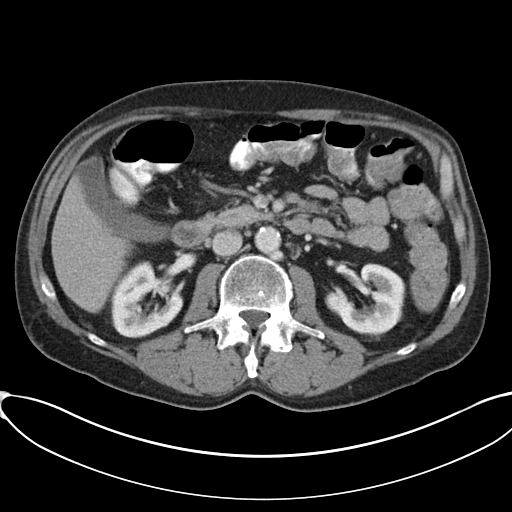
[im 64/101  bone]
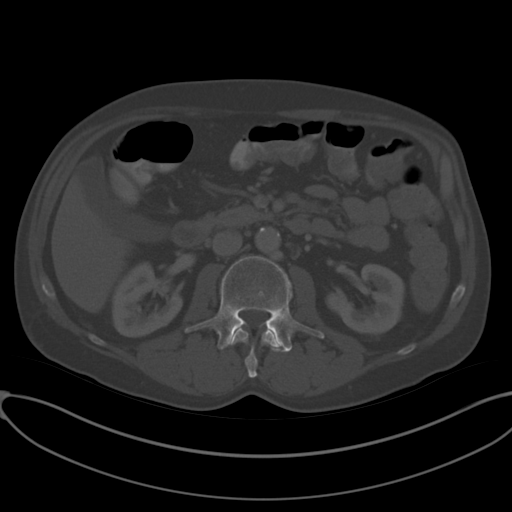
[im 74/101  soft-tissue]
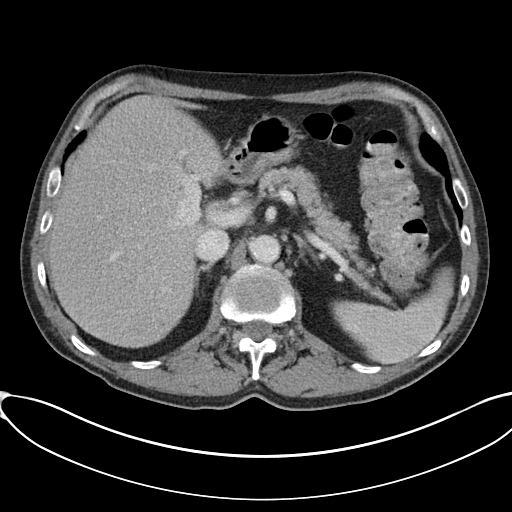
[im 79/101  soft-tissue]
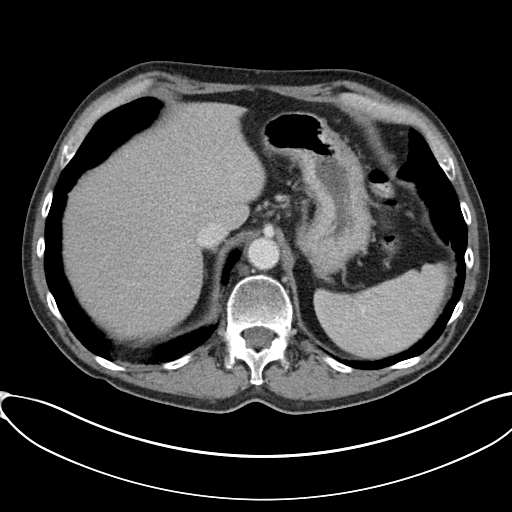
[im 85/101  soft-tissue]
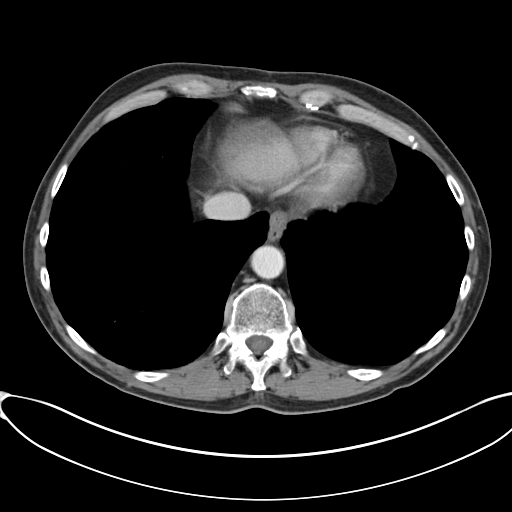
[im 95/101  soft-tissue]
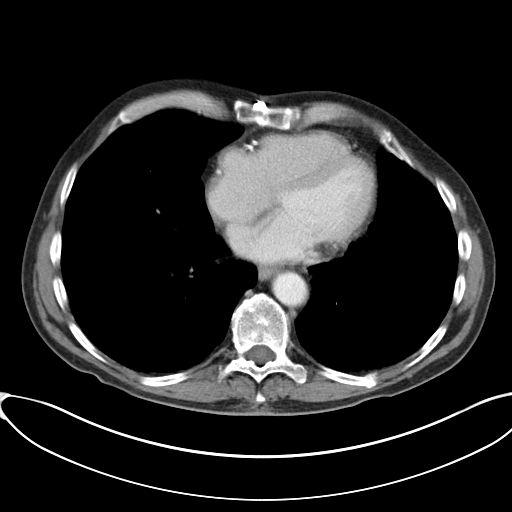

[Series 5: cororal soft tissue · coronal · 0.82mm/px · 3 of 84 slices shown]
[im 28/84  soft-tissue]
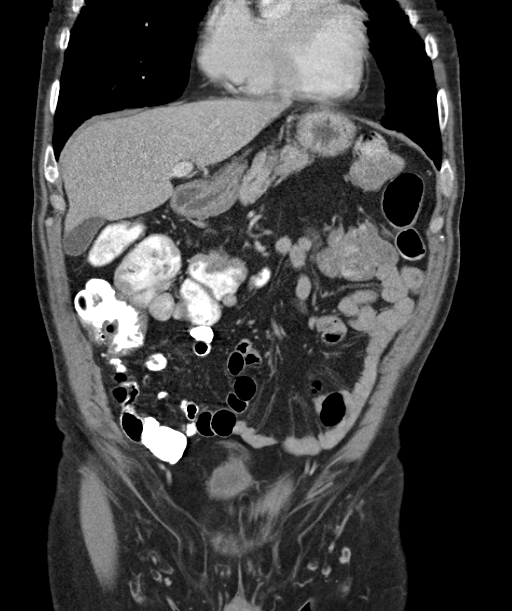
[im 37/84  soft-tissue]
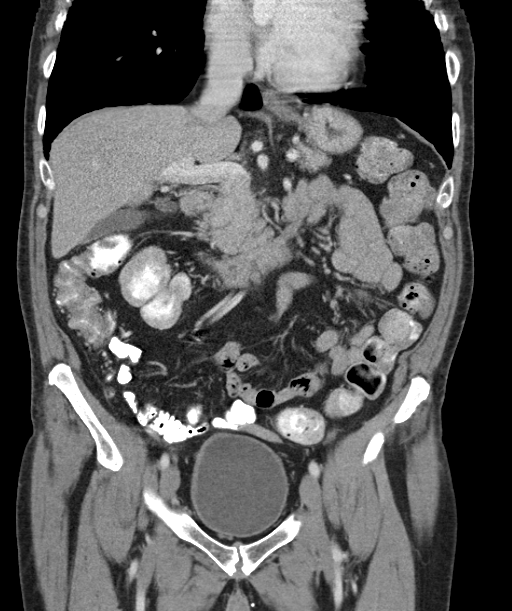
[im 47/84  soft-tissue]
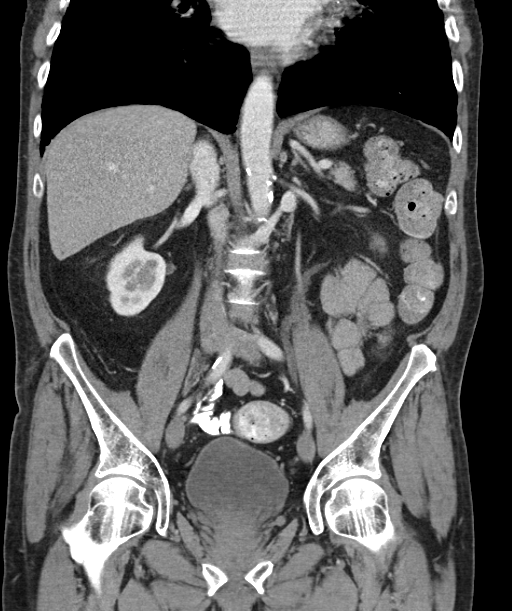

[16 of 46 positions shown; findings below may reference images not displayed]

FINDINGS: Colonic surgery with anastomosis rectosigmoid region. Within the
pelvis are prominent vessels and small nodes but without adenopathy.

Ascending colon with circumferential thickening. It is possible this
is related to under distension although underlying mass or colitis
not excluded. No extra luminal bowel inflammatory process, free
fluid or free air. There may be a tiny appendicolith but no
inflammation surrounding the appendix.

Tiny low-density lesions within the right lobe liver may be a cysts
although given the patient's history of colon cancer and lack of
prior exams, followup CT in 6 months recommended to confirm
stability.

No worrisome splenic, pancreatic, adrenal or renal lesion. Small
right renal cyst noted.

Coronary artery calcifications. Atherosclerotic type changes of the
aorta, iliac arteries and femoral arteries. No evidence of abdominal
aortic aneurysm. Narrowing of the celiac artery origin.

No calcified gallstones.

Small retroperitoneal nodes without adenopathy.

Motion degraded imaging of the lung bases with subsegmental
atelectatic changes.

Underdistended non contrast filled imaging of the urinary bladder
unremarkable.

Mild prominence of the seminal vesicles felt to be incidental
finding.

Degenerative changes lumbar spine without bony destructive lesion.
IMPRESSION: Colonic surgery with anastomosis rectosigmoid region.

Ascending colon with circumferential thickening. It is possible this
is related to under distension although underlying mass or colitis
not excluded. No extra luminal bowel inflammatory process, free
fluid or free air. There may be a tiny appendicolith but no
inflammation surrounding the appendix.

Tiny low-density lesions within the right lobe liver may be a cysts
although given the patient's history of colon cancer and lack of
prior exams, followup CT in 6 months recommended to confirm
stability.

Atherosclerotic type changes as noted above.

## 2015-10-13 ENCOUNTER — Ambulatory Visit (HOSPITAL_COMMUNITY): Payer: Self-pay | Admitting: Psychiatry

## 2015-10-13 ENCOUNTER — Ambulatory Visit (INDEPENDENT_AMBULATORY_CARE_PROVIDER_SITE_OTHER): Payer: Medicare HMO | Admitting: Psychiatry

## 2015-10-13 ENCOUNTER — Encounter (HOSPITAL_COMMUNITY): Payer: Self-pay | Admitting: Psychiatry

## 2015-10-13 VITALS — BP 126/72 | HR 56 | Ht 77.0 in | Wt 218.0 lb

## 2015-10-13 DIAGNOSIS — F5101 Primary insomnia: Secondary | ICD-10-CM | POA: Diagnosis not present

## 2015-10-13 MED ORDER — ZOLPIDEM TARTRATE ER 12.5 MG PO TBCR: 12.5000 mg | EXTENDED_RELEASE_TABLET | Freq: Every evening | ORAL | 5 refills | Status: DC | PRN

## 2015-10-13 NOTE — Progress Notes (Signed)
[facility] MD Progress Note  10/13/2015 10:21 AM [name]  MRN:  [id number] Subjective:  Sleeping well Principal Problem: Insomnia Diagnosis:  Primary insomnia Today the patient is doing fairly well. Again he knowledge is that the Ambien 12.5 mg CR is better than standard Ambien. He does keep her asleep longer. Nonetheless 2 or 3 nights every week he wakes up at 3:00 and cannot go back to sleep. He says the rest the day is affected. He says during the day she doesn't sleep he feels very weak and not productive. This is in contrast to the nights that he sleeps through the night where he does really well. In essence he is better but he  Still has problems sleeping 3 nights. He goes to sleep without any problems. This patient denies any depression or anxiety. He does not drink any alcohol. He shows no evidence of psychosis. Is now being treated for restless legs and he says is no longer problem. The patient is eating well. His only issue seems to be sleep. Today once again we went over sleep hygiene. He seems to follow it pretty well. He does seem to be a primary sleep problem in this individual. Patient Active Problem List   Diagnosis Date Noted  . TI (tricuspid incompetence) [I07.1] 07/28/2014  . Benign essential HTN [I10] 07/22/2014  . Arteriosclerosis of coronary artery [I25.10] 02/10/2014  . MI (mitral incompetence) [I34.0] 02/10/2014  . Persistent atrial fibrillation ([place]) [I48.1] 02/10/2014  . Constipation [K59.00] 01/10/2014  . Pulmonary nodule, left [R91.1] 11/29/2013  . Anxiety and depression [F41.8] 07/27/2013  . Chest pain, unspecified [R07.9] 07/27/2013  . History of alcoholism ([place]) [F10.21] 07/27/2013  . History of tobacco use [Z87.891] 07/27/2013  . 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 H/O coronary artery bypass surgery [Z95.1] 07/27/2013  . Breath shortness [R06.02] 07/27/2013  . Status post hemorrhoidectomy Dec 2014 [Z98.890, Z87.19] 03/10/2013   . Hx of CABG [Z95.1] 01/22/2013  . Insomnia [G47.00] 09/30/2012  . History of hepatitis B [Z86.19] 09/09/2012  . Colon cancer ([employer]) [C18.9] 07/28/2012  . Thrombocytopenia ([place]) [D69.6] 11/04/2011  . Chest pain [R07.9] 11/04/2011  . Chronic systolic heart failure ([place]) [I50.22] 11/03/2011  . 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 Alcohol abuse [F10.10] 01/18/2009  . TOBACCO ABUSE [F17.200] 01/18/2009  . DEPRESSION [F32.9] 01/17/2009   Total Time spent with patient: 15 minutes  Past Psychiatric History:    Past Medical History:  Past Medical History:  Diagnosis Date  . A-fib ([place])   . ALCOHOL ABUSE 01/18/2009  . Anxiety   . Colon cancer ([place])   . Coronary artery disease    s/p CABG  . Depression   . 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 03/21/2011 - 2D Echo: EF 25% to 30%. Diffuse hypokinesis.  . Fatigue   . H/O alcohol abuse   . HEMORRHOIDS 01/17/2009  . Hepatitis    hepatitis B-never treated-Titers were low  . HLD (hyperlipidemia)   . HTN (hypertension)   . Insomnia    TAKES ATIVAN TO SLEEP  . Left shoulder pain   . Persistent atrial fibrillation ([place])    diagnosed 2010  . Rhonchi     Past Surgical History:  Procedure Laterality Date  . CARDIAC CATHETERIZATION    . COLON SURGERY  07/2012   COLON CANCER  . COLONOSCOPY N/A 02/11/2013   Procedure: COLONOSCOPY;  Surgeon: Milus Banister, MD;  [facility];  Service:  Endoscopy;  Laterality: N/A;  . CORONARY ARTERY BYPASS GRAFT     triple  bypass OCT 2013 in [place] N/A 08/30/2014   Procedure: CARDIOVERSION;  Surgeon: [name], MD;  Location: [facility] ORS;  Service: Cardiovascular;  Laterality: N/A;  . ELECTROPHYSIOLOGIC STUDY N/A 09/29/2014   Procedure: CARDIOVERSION;  Surgeon: [name], MD;  Location: [facility];  Service: Cardiovascular;  Laterality: N/A;  . FLEXIBLE SIGMOIDOSCOPY N/A 12/16/2013   Procedure: FLEXIBLE  SIGMOIDOSCOPY;  Surgeon: Milus Banister, MD;  [facility];  Service: Endoscopy;  Laterality: N/A;  . HEMORRHOID SURGERY N/A 03/09/2013   Procedure: HEMORRHOIDECTOMY;  Surgeon: Pedro Earls, MD;  [facility];  Service: General;  Laterality: N/A;  . MOUTH SURGERY     Family History:  Family History  Problem Relation Age of Onset  . Heart disease Father   . Asthma Mother   . Dementia Mother   . Colon cancer Sister 60   Family Psychiatric  History:   Social History:  History  Alcohol Use  . 0.0 oz/week    Comment: 2 drinks per month, previously heavy     History  Drug Use No    Social History   Social History  . Marital status: Divorced    Spouse name: N/A  . Number of children: 2  . Years of education: 12   Occupational History  . RetiredAnimal nutritionist    .  Retired   Social History Main Topics  . Smoking status: Current Every Day Smoker    Packs/day: 0.75    Years: 50.00    Types: Cigarettes  . Smokeless tobacco: Never Used     Comment: tobacco info given 01/07/14  . Alcohol use 0.0 oz/week     Comment: 2 drinks per month, previously heavy  . Drug use: No  . Sexual activity: No   Other Topics Concern  . 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   Has moved homes x 3 in 2 months   States cannot always afford meds-See PI   Drinks 2 cups of coffee a day    Additional Social History:                         Sleep: Good  Appetite:  Good  Current Medications: Current Outpatient Prescriptions  Medication Sig Dispense Refill  . amiodarone (PACERONE) 200 MG tablet Take one tablet by mouth twice daily (Patient taking differently: 400 mg daily. Take one tablet by mouth twice daily) 60 tablet 3  . apixaban (ELIQUIS) 5 MG TABS tablet Take 5 mg by mouth 2 (two) times daily.    . metoprolol succinate (TOPROL-XL) 25 MG 24 hr tablet Take 25 mg by  mouth daily.    . polyethylene glycol powder (GLYCOLAX/MIRALAX) powder Take 17 g by mouth daily. 255 g 3  . zolpidem (AMBIEN CR) 12.5 MG CR tablet Take 1 tablet (12.5 mg total) by mouth at bedtime as needed for sleep. 30 tablet 5   No current facility-administered medications for this visit.     Lab Results: No results found for this or any previous visit (from the past 48 hour(s)).  Blood Alcohol level:  Lab Results  Component Value Date   ETH <11 12/01/2011   ETH 118 (H) 11/03/2011    Physical Findings: AIMS:  , ,  ,  ,  CIWA:    COWS:     Musculoskeletal: Strength & Muscle Tone: within normal limits Gait & Station: normal Patient leans: N/A  Psychiatric Specialty Exam: ROS  Blood pressure 126/72, pulse (!) 56, height 6\' 5"  (1.956 m), weight 218 lb (98.9 kg).Body mass index is 25.85 kg/m.  General Appearance: Casual  Eye Contact::  Good  Speech:  Clear and Coherent  Volume:  Normal  Mood:  Euthymic  Affect:  Congruent  Thought Process:  Coherent  Orientation:  Full (Time, Place, and Person)  Thought Content:  WDL  Suicidal Thoughts:  No  Homicidal Thoughts:  No  Memory:  NA  Judgement:  Good  Insight:  Good  Psychomotor Activity:  Normal  Concentration:  Good  Recall:  Good  Fund of Knowledge:Good  Language: Good  Akathisia:  No  Handed:  Right  AIMS (if indicated):     Assets:  Communication Skills  ADL's:  Intact  Cognition: WNL  Sleep:      Treatment Plan Summary: At this time the patient will continue taking the Ambien 12.5 CR and today we recommended that he add to it melatonin 3 mg. He's been on this agent many years ago but he was never taking it with the Ambien. It is my have a combination to help get a better night of sleep. The patient otherwise is not suicidal and has no physical complaints. He is actually functioning very well. It is just that for 3 days out of the week he wakes 3:00 in the morning and cannot get back to sleep and really  affects the rest of those days. He she'll return to see me in 5 months.  Haskel Schroeder, MD 10/13/2015, 10:21 AM Patient ID: [name], male   DOB: Mar 06, 1946, 70 y.o.   MRN: [id number]

## 2015-12-21 ENCOUNTER — Encounter: Payer: Self-pay | Admitting: Gastroenterology

## 2016-01-03 ENCOUNTER — Telehealth (HOSPITAL_COMMUNITY): Payer: Self-pay

## 2016-01-03 NOTE — Telephone Encounter (Signed)
Called [facility] and s/w [place] - patients Ambien is approved from 01/02/2016 - 03/10/2017. Prior Josem Kaufmann number is PA - [id number], the reference number is the same as the PA. I called the pharmacy and the patient and let them know.

## 2016-01-04 ENCOUNTER — Ambulatory Visit: Payer: Self-pay | Admitting: Gastroenterology

## 2016-01-19 ENCOUNTER — Telehealth: Payer: Self-pay | Admitting: *Deleted

## 2016-01-19 ENCOUNTER — Ambulatory Visit (INDEPENDENT_AMBULATORY_CARE_PROVIDER_SITE_OTHER): Payer: Medicare Other | Admitting: Gastroenterology

## 2016-01-19 ENCOUNTER — Other Ambulatory Visit: Payer: Self-pay | Admitting: *Deleted

## 2016-01-19 ENCOUNTER — Encounter: Payer: Self-pay | Admitting: Gastroenterology

## 2016-01-19 VITALS — BP 100/72 | HR 60 | Ht 75.25 in | Wt 216.0 lb

## 2016-01-19 DIAGNOSIS — Z7901 Long term (current) use of anticoagulants: Secondary | ICD-10-CM

## 2016-01-19 DIAGNOSIS — Z85038 Personal history of other malignant neoplasm of large intestine: Secondary | ICD-10-CM | POA: Diagnosis not present

## 2016-01-19 DIAGNOSIS — I4891 Unspecified atrial fibrillation: Secondary | ICD-10-CM

## 2016-01-19 MED ORDER — NA SULFATE-K SULFATE-MG SULF 17.5-3.13-1.6 GM/177ML PO SOLN: 1.0000 | Freq: Once | ORAL | 0 refills | Status: AC

## 2016-01-19 NOTE — Telephone Encounter (Signed)
01/19/2016   RE: [name] DOB: Sep 29, 1945 MRN: [id number]   Dear Dr. Serafina Royals ,    We have scheduled the above patient for an endoscopic procedure. Our records show that he is on anticoagulation therapy.   Please advise as to how long the patient may come off his therapy of Eliquis prior to the procedure, which is scheduled for 02/15/2016.  Please fax back/ or route the  Anti-coagulation clearance instructions to Katie at [phone].   Sincerely,    Alonza Bogus PA-C

## 2016-01-19 NOTE — Progress Notes (Signed)
     01/19/2016 [name] [id number] 11-29-1945   History of Present Illness:  This is a 70 year old male who is known to Dr. Ardis Hughs for colonoscopies. He has a history of colon cancer with surgery in May 2014. His last colonoscopy was with Dr. Ardis Hughs in December 2014 at which time there was a normal colocolonic anastomosis at 15 cm from the anus. The examination was otherwise normal except for medium size internal and external hemorrhoids. Repeat colonoscopy was recommended in in 3 years from that time. He is here today to discuss colonoscopy. He is on Eliqus, which is prescribed by his cardiologist, Dr. Nehemiah Massed. His atrial fibrillation has been under control.  No complaints today.  Still deals with some constipation but knows that it will continue to be an issue, but overall has a good regimen and Miralax and fiber supplement.  Current Medications, Allergies, Past Medical History, Past Surgical History, Family History and Social History were reviewed in [employer] record.   Physical Exam: BP 100/72   Pulse 60   Ht 6' 3.25" (1.911 m)   Wt 216 lb (98 kg)   BMI 26.82 kg/m  General: Well developed white male in no acute distress Head: Normocephalic and atraumatic Eyes:  Sclerae anicteric, conjunctiva pink  Ears: Normal auditory acuity Lungs: Clear throughout to auscultation Heart: Regular rate and rhythm Abdomen: Soft, non-distended. Normal bowel sounds.  Non-tender. Rectal:  Will be done at the time of colonoscopy. Musculoskeletal: Symmetrical with no gross deformities  Extremities: No edema  Neurological: Alert oriented x 4, grossly non-focal Psychological:  Alert and cooperative. Normal mood and affect  Assessment and Recommendations: -Personal history of colon cancer:  Last colonoscopy 02/2013.  Will schedule with Dr. Ardis Hughs at [facility] hospital at patient's request. -Chronic anticoagulation use with Eliquis for atrial fibrillation:  Will hold Eliquis for 2  days prior to endoscopic procedures - will instruct when and how to resume after procedure. Benefits and risks of procedure explained including risks of bleeding, perforation, infection, missed lesions, reactions to medications and possible need for hospitalization and surgery for complications. Additional rare but real risk of stroke or other vascular clotting events off of Eliquis also explained and need to seek urgent help if any signs of these problems occur. Will communicate by phone or EMR with patient's prescribing provider, Dr. Nehemiah Massed, to confirm that holding Eliquis is reasonable in this case.

## 2016-01-19 NOTE — Patient Instructions (Addendum)
We will call you when we get the Eliquis medication instructions from Dr. Nehemiah Massed.   You have been scheduled for a colonoscopy. Please follow written instructions given to you at your visit today.  Please pick up your prep supplies at the pharmacy within the next 1-3 days. [facility].  If you use inhalers (even only as needed), please bring them with you on the day of your procedure. Your physician has requested that you go to www.startemmi.com and enter the access code given to you at your visit today. This web site gives a general overview about your procedure. However, you should still follow specific instructions given to you by our office regarding your preparation for the procedure.

## 2016-01-19 NOTE — Progress Notes (Signed)
I agree with the above note, plan 

## 2016-02-06 NOTE — Telephone Encounter (Signed)
Called Dr. Derrick Ravel office and spoke to [place]. I advsied I faxed an Eliquis clearance letter on 01-19-2016 and I had not gotten a response. Mardene Celeste asked me to fax the letter to [phone] and she would get it in the right hands.  Faxed it today 02-06-2016.

## 2016-02-13 ENCOUNTER — Encounter (HOSPITAL_COMMUNITY): Payer: Self-pay

## 2016-02-14 ENCOUNTER — Telehealth: Payer: Self-pay | Admitting: Gastroenterology

## 2016-02-14 NOTE — Telephone Encounter (Signed)
Patient's appt has been cancelled.  He will call back once he gets back from out of town.  He will call back to reschedule.

## 2016-02-15 ENCOUNTER — Ambulatory Visit (HOSPITAL_COMMUNITY): Admission: RE | Admit: 2016-02-15 | Payer: Medicare Other | Source: Ambulatory Visit | Admitting: Gastroenterology

## 2016-02-15 ENCOUNTER — Telehealth: Payer: Self-pay | Admitting: *Deleted

## 2016-02-15 SURGERY — COLONOSCOPY WITH PROPOFOL
Anesthesia: Monitor Anesthesia Care

## 2016-02-15 MED ORDER — FENTANYL CITRATE (PF) 100 MCG/2ML IJ SOLN
INTRAMUSCULAR | Status: AC
  Filled 2016-02-15: qty 2

## 2016-02-15 MED ORDER — PROPOFOL 10 MG/ML IV BOLUS
INTRAVENOUS | Status: AC
  Filled 2016-02-15: qty 40

## 2016-02-15 MED ORDER — FENTANYL CITRATE (PF) 100 MCG/2ML IJ SOLN
INTRAMUSCULAR | Status: AC
  Filled 2016-02-15: qty 4

## 2016-02-15 MED ORDER — MIDAZOLAM HCL 2 MG/2ML IJ SOLN
INTRAMUSCULAR | Status: AC
  Filled 2016-02-15: qty 4

## 2016-02-15 NOTE — Telephone Encounter (Signed)
Ok, thanks.

## 2016-02-15 NOTE — Telephone Encounter (Signed)
This patient advised Barb Merino RN on 12-6  that he had to cancel his procedure at [facility] for 02-15-16. He was stuck out of town and will have to call back at a later time to reschedule.  I got the instructions from Dr. Nehemiah Massed regarding his Eliquis clearance.  I will keep this information for when he reschedules.

## 2016-02-23 ENCOUNTER — Telehealth: Payer: Self-pay | Admitting: Gastroenterology

## 2016-02-26 NOTE — Telephone Encounter (Signed)
Left message on machine to call back  

## 2016-02-27 ENCOUNTER — Other Ambulatory Visit: Payer: Self-pay

## 2016-02-27 DIAGNOSIS — Z85038 Personal history of other malignant neoplasm of large intestine: Secondary | ICD-10-CM

## 2016-02-27 NOTE — Telephone Encounter (Signed)
Colon scheduled, pt instructed and medications reviewed.  Patient instructions mailed to home.  Patient to call with any questions or concerns.  

## 2016-03-15 ENCOUNTER — Ambulatory Visit (INDEPENDENT_AMBULATORY_CARE_PROVIDER_SITE_OTHER): Payer: Medicaid Other | Admitting: Psychiatry

## 2016-03-15 ENCOUNTER — Encounter (HOSPITAL_COMMUNITY): Payer: Self-pay | Admitting: Psychiatry

## 2016-03-15 VITALS — BP 130/78 | HR 58 | Ht 77.0 in | Wt 220.6 lb

## 2016-03-15 DIAGNOSIS — F5101 Primary insomnia: Secondary | ICD-10-CM

## 2016-03-15 DIAGNOSIS — Z825 Family history of asthma and other chronic lower respiratory diseases: Secondary | ICD-10-CM | POA: Diagnosis not present

## 2016-03-15 DIAGNOSIS — Z808 Family history of malignant neoplasm of other organs or systems: Secondary | ICD-10-CM | POA: Diagnosis not present

## 2016-03-15 DIAGNOSIS — Z8249 Family history of ischemic heart disease and other diseases of the circulatory system: Secondary | ICD-10-CM | POA: Diagnosis not present

## 2016-03-15 DIAGNOSIS — Z79899 Other long term (current) drug therapy: Secondary | ICD-10-CM

## 2016-03-15 DIAGNOSIS — F17210 Nicotine dependence, cigarettes, uncomplicated: Secondary | ICD-10-CM

## 2016-03-15 MED ORDER — ZOLPIDEM TARTRATE ER 12.5 MG PO TBCR: 12.5000 mg | EXTENDED_RELEASE_TABLET | Freq: Every evening | ORAL | 5 refills | Status: DC | PRN

## 2016-03-15 NOTE — Progress Notes (Signed)
[facility] MD Progress Note  03/15/2016 10:01 AM SHANNE HAMS  MRN:  [id number] Subjective:  Sleeping well Principal Problem: Insomnia Diagnosis:  Primary insomnia Today the patient is doing well. His sleep is improved. He denies being depressed. He denies the use of alcohol. In a very good Christmas. At this time he typically goes to bed about 9:30 and sleeps most the time to about 3:30 or 4 Geralyn Flash him about 6 hours of sleep. The reality is he does not take naps and now he doesn't seem to feel as sleepy during the day. The addition of melatonin I think is been beneficial. He no longer has any issues with restless legs. Overall the patient feels well. His sleep is definitely improved. Is functioning very well. Is no significant psychiatric symptomatology at this time. Patient Active Problem List   Diagnosis Date Noted  . History of colon cancer [Z85.038] 01/19/2016  . Chronic anticoagulation [Z79.01] 01/19/2016  . TI (tricuspid incompetence) [I07.1] 07/28/2014  . Benign essential HTN [I10] 07/22/2014  . Arteriosclerosis of coronary artery [I25.10] 02/10/2014  . MI (mitral incompetence) [I34.0] 02/10/2014  . Persistent atrial fibrillation ([place]) [I48.1] 02/10/2014  . Constipation [K59.00] 01/10/2014  . Pulmonary nodule, left [R91.1] 11/29/2013  . Anxiety and depression [F41.8] 07/27/2013  . Chest pain, unspecified [R07.9] 07/27/2013  . History of alcoholism ([place]) [F10.21] 07/27/2013  . History of tobacco use [Z87.891] 07/27/2013  . 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 H/O coronary artery bypass surgery [Z95.1] 07/27/2013  . Breath shortness [R06.02] 07/27/2013  . Status post hemorrhoidectomy Dec 2014 [Z98.890, Z87.19] 03/10/2013  . Hx of CABG [Z95.1] 01/22/2013  . Insomnia [G47.00] 09/30/2012  . History of hepatitis B [Z86.19] 09/09/2012  . Colon cancer ([place]) [C18.9] 07/28/2012  . Thrombocytopenia ([place]) [D69.6] 11/04/2011  . Chest pain [R07.9] 11/04/2011  .  Chronic systolic heart failure ([place]) [I50.22] 11/03/2011  . 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 Alcohol abuse [F10.10] 01/18/2009  . TOBACCO ABUSE [F17.200] 01/18/2009  . DEPRESSION [F32.9] 01/17/2009  . Atrial fibrillation ([place]) [I48.91] 01/17/2009   Total Time spent with patient: 15 minutes  Past Psychiatric History:    Past Medical History:  Past Medical History:  Diagnosis Date  . A-fib ([place])   . ALCOHOL ABUSE 01/18/2009  . Anxiety   . Colon cancer ([place])   . Coronary artery disease    s/p CABG  . Depression   . 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 03/21/2011 - 2D Echo: EF 25% to 30%. Diffuse hypokinesis.  . Fatigue   . H/O alcohol abuse   . HEMORRHOIDS 01/17/2009  . Hepatitis    hepatitis B-never treated-Titers were low  . HLD (hyperlipidemia)   . HTN (hypertension)   . Insomnia    TAKES ATIVAN TO SLEEP  . Left shoulder pain   . Persistent atrial fibrillation ([place])    diagnosed 2010  . Rhonchi     Past Surgical History:  Procedure Laterality Date  . CARDIAC CATHETERIZATION    . COLON SURGERY  07/2012   COLON CANCER  . COLONOSCOPY N/A 02/11/2013   Procedure: COLONOSCOPY;  Surgeon: Milus Banister, MD;  Location: [facility];  Service: Endoscopy;  Laterality: N/A;  . CORONARY ARTERY BYPASS GRAFT     triple  bypass OCT 2013 in [place] N/A 08/30/2014   Procedure: CARDIOVERSION;  Surgeon: [name], MD;  Location: [facility] ORS;  Service: Cardiovascular;  Laterality:  N/A;  . ELECTROPHYSIOLOGIC STUDY N/A 09/29/2014   Procedure: CARDIOVERSION;  Surgeon: [name], MD;  Location: [facility] [facility];  Service: Cardiovascular;  Laterality: N/A;  . FLEXIBLE SIGMOIDOSCOPY N/A 12/16/2013   Procedure: FLEXIBLE SIGMOIDOSCOPY;  Surgeon: Milus Banister, MD;  [facility]: [facility];  Service: Endoscopy;  Laterality: N/A;  . HEMORRHOID SURGERY N/A 03/09/2013   Procedure: HEMORRHOIDECTOMY;  Surgeon:  Pedro Earls, MD;  Location: [facility];  Service: General;  Laterality: N/A;  . MOUTH SURGERY     Family History:  Family History  Problem Relation Age of Onset  . Heart disease Father   . Asthma Mother   . Dementia Mother   . Colon cancer Sister 4   Family Psychiatric  History:   Social History:  History  Alcohol Use  . 0.0 oz/week    Comment: 2-4 drinks per month, previously heavy     History  Drug Use No    Social History   Social History  . Marital status: Divorced    Spouse name: N/A  . Number of children: 2  . Years of education: 12   Occupational History  . RetiredAnimal nutritionist    .  Retired   Social History Main Topics  . Smoking status: Current Every Day Smoker    Packs/day: 0.75    Years: 50.00    Types: Cigarettes  . Smokeless tobacco: Never Used     Comment: tobacco info given 01/07/14  . Alcohol use 0.0 oz/week     Comment: 2-4 drinks per month, previously heavy  . Drug use: No  . Sexual activity: No   Other Topics Concern  . 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   Has moved homes x 3 in 2 months   States cannot always afford meds-See PI   Drinks 2 cups of coffee a day    Additional Social History:                         Sleep: Good  Appetite:  Good  Current Medications: Current Outpatient Prescriptions  Medication Sig Dispense Refill  . amiodarone (PACERONE) 200 MG tablet Take one tablet by mouth twice daily (Patient taking differently: Take 200 mg by mouth daily. ) 60 tablet 3  . apixaban (ELIQUIS) 5 MG TABS tablet Take 5 mg by mouth 2 (two) times daily.    . metoprolol succinate (TOPROL-XL) 25 MG 24 hr tablet Take 25 mg by mouth daily.    . polyethylene glycol powder (GLYCOLAX/MIRALAX) powder Take 17 g by mouth daily. (Patient taking differently: Take 17 g by mouth as needed. ) 255 g 3  . zolpidem (AMBIEN CR) 12.5 MG CR tablet  Take 1 tablet (12.5 mg total) by mouth at bedtime as needed for sleep. 30 tablet 5   No current facility-administered medications for this visit.     Lab Results: No results found for this or any previous visit (from the past 48 hour(s)).  Blood Alcohol level:  Lab Results  Component Value Date   ETH <11 12/01/2011   ETH 118 (H) 11/03/2011    Physical Findings: AIMS:  , ,  ,  ,    CIWA:    COWS:     Musculoskeletal: Strength & Muscle Tone: within normal limits Gait & Station: normal Patient leans: N/A  Psychiatric Specialty Exam: ROS  Blood  pressure 130/78, pulse (!) 58, height 6\' 5"  (1.956 m), weight 220 lb 9.6 oz (100.1 kg).Body mass index is 26.16 kg/m.  General Appearance: Casual  Eye Contact::  Good  Speech:  Clear and Coherent  Volume:  Normal  Mood:  Euthymic  Affect:  Congruent  Thought Process:  Coherent  Orientation:  Full (Time, Place, and Person)  Thought Content:  WDL  Suicidal Thoughts:  No  Homicidal Thoughts:  No  Memory:  NA  Judgement:  Good  Insight:  Good  Psychomotor Activity:  Normal  Concentration:  Good  Recall:  Good  Fund of Knowledge:Good  Language: Good  Akathisia:  No  Handed:  Right  AIMS (if indicated):     Assets:  Communication Skills  ADL's:  Intact  Cognition: WNL  Sleep:      Treatment Plan Summary: This patient has a primary sleep disorder. His primary care doctor would rather him receive care specifically an Ambien prescription from this office and I had no problems with that at all. The patient takes his medicines very appropriately. The combination of Ambien and melatonin has been successful. He now sleeps better and with a good night's sleep he performs better in his day-to-day life. This patient to return to see me in 5 months for a 15 minute visit we we will continue his Ambien CR 12.5 together with melatonin 3 mg. Haskel Schroeder, MD 03/15/2016, 10:01 AM Patient ID: [name], male   DOB: [birth year], 71  y.o.   MRN: [id number]

## 2016-03-26 ENCOUNTER — Encounter (HOSPITAL_COMMUNITY): Payer: Self-pay | Admitting: *Deleted

## 2016-03-28 ENCOUNTER — Ambulatory Visit (HOSPITAL_COMMUNITY): Admission: RE | Admit: 2016-03-28 | Payer: Medicare Other | Source: Ambulatory Visit | Admitting: Gastroenterology

## 2016-03-28 SURGERY — COLONOSCOPY WITH PROPOFOL
Anesthesia: Monitor Anesthesia Care

## 2016-04-10 ENCOUNTER — Telehealth: Payer: Self-pay | Admitting: Gastroenterology

## 2016-04-10 NOTE — Telephone Encounter (Signed)
Left message on machine to call back  

## 2016-04-11 ENCOUNTER — Other Ambulatory Visit: Payer: Self-pay

## 2016-04-11 DIAGNOSIS — Z7901 Long term (current) use of anticoagulants: Secondary | ICD-10-CM

## 2016-04-11 DIAGNOSIS — Z85038 Personal history of other malignant neoplasm of large intestine: Secondary | ICD-10-CM

## 2016-04-11 DIAGNOSIS — I4891 Unspecified atrial fibrillation: Secondary | ICD-10-CM

## 2016-04-11 NOTE — Telephone Encounter (Signed)
Colon scheduled, pt instructed and medications reviewed.  Patient instructions mailed to home.  Patient to call with any questions or concerns.  

## 2016-04-26 ENCOUNTER — Telehealth: Payer: Self-pay | Admitting: Gastroenterology

## 2016-04-26 NOTE — Telephone Encounter (Signed)
The pt is on abx for respiratory infection and wants to know if he can still have procedure on 05/02/16.  He was advised to keep appt as scheduled unless he is running a fever or his symptoms worsen.

## 2016-05-02 ENCOUNTER — Ambulatory Visit (HOSPITAL_COMMUNITY): Payer: Medicare Other | Admitting: Registered Nurse

## 2016-05-02 ENCOUNTER — Encounter (HOSPITAL_COMMUNITY)
Admission: RE | Disposition: A | Discharge status: Home / Self Care | Payer: Self-pay | Source: Ambulatory Visit | Attending: Gastroenterology

## 2016-05-02 ENCOUNTER — Ambulatory Visit (HOSPITAL_COMMUNITY)
Admission: RE | Admit: 2016-05-02 | Discharge: 2016-05-02 | Disposition: A | Discharge status: Home / Self Care | Payer: Medicare Other | Source: Ambulatory Visit | Attending: Gastroenterology | Admitting: Gastroenterology

## 2016-05-02 ENCOUNTER — Encounter (HOSPITAL_COMMUNITY): Payer: Self-pay | Admitting: Registered Nurse

## 2016-05-02 DIAGNOSIS — I10 Essential (primary) hypertension: Secondary | ICD-10-CM | POA: Insufficient documentation

## 2016-05-02 DIAGNOSIS — I426 Alcoholic cardiomyopathy: Secondary | ICD-10-CM | POA: Insufficient documentation

## 2016-05-02 DIAGNOSIS — I4891 Unspecified atrial fibrillation: Secondary | ICD-10-CM

## 2016-05-02 DIAGNOSIS — D123 Benign neoplasm of transverse colon: Secondary | ICD-10-CM | POA: Diagnosis present

## 2016-05-02 DIAGNOSIS — E785 Hyperlipidemia, unspecified: Secondary | ICD-10-CM | POA: Insufficient documentation

## 2016-05-02 DIAGNOSIS — I2510 Atherosclerotic heart disease of native coronary artery without angina pectoris: Secondary | ICD-10-CM | POA: Insufficient documentation

## 2016-05-02 DIAGNOSIS — J449 Chronic obstructive pulmonary disease, unspecified: Secondary | ICD-10-CM | POA: Diagnosis not present

## 2016-05-02 DIAGNOSIS — I420 Dilated cardiomyopathy: Secondary | ICD-10-CM | POA: Diagnosis not present

## 2016-05-02 DIAGNOSIS — F17210 Nicotine dependence, cigarettes, uncomplicated: Secondary | ICD-10-CM | POA: Insufficient documentation

## 2016-05-02 DIAGNOSIS — Z951 Presence of aortocoronary bypass graft: Secondary | ICD-10-CM | POA: Insufficient documentation

## 2016-05-02 DIAGNOSIS — I481 Persistent atrial fibrillation: Secondary | ICD-10-CM | POA: Insufficient documentation

## 2016-05-02 DIAGNOSIS — F419 Anxiety disorder, unspecified: Secondary | ICD-10-CM | POA: Insufficient documentation

## 2016-05-02 DIAGNOSIS — Z7901 Long term (current) use of anticoagulants: Secondary | ICD-10-CM

## 2016-05-02 DIAGNOSIS — Z1211 Encounter for screening for malignant neoplasm of colon: Secondary | ICD-10-CM | POA: Diagnosis not present

## 2016-05-02 DIAGNOSIS — G4700 Insomnia, unspecified: Secondary | ICD-10-CM | POA: Diagnosis not present

## 2016-05-02 DIAGNOSIS — Z980 Intestinal bypass and anastomosis status: Secondary | ICD-10-CM | POA: Diagnosis not present

## 2016-05-02 DIAGNOSIS — F329 Major depressive disorder, single episode, unspecified: Secondary | ICD-10-CM | POA: Insufficient documentation

## 2016-05-02 DIAGNOSIS — Z85038 Personal history of other malignant neoplasm of large intestine: Secondary | ICD-10-CM | POA: Diagnosis not present

## 2016-05-02 HISTORY — PX: COLONOSCOPY WITH PROPOFOL: SHX5780

## 2016-05-02 SURGERY — COLONOSCOPY WITH PROPOFOL
Anesthesia: Monitor Anesthesia Care

## 2016-05-02 MED ORDER — LACTATED RINGERS IV SOLN
INTRAVENOUS | Status: DC
  Administered 2016-05-02: 1000 mL via INTRAVENOUS

## 2016-05-02 MED ORDER — SODIUM CHLORIDE 0.9 % IV SOLN: INTRAVENOUS | Status: DC

## 2016-05-02 MED ORDER — PROPOFOL 10 MG/ML IV BOLUS
INTRAVENOUS | Status: AC
  Filled 2016-05-02: qty 20

## 2016-05-02 MED ORDER — PROPOFOL 500 MG/50ML IV EMUL
INTRAVENOUS | Status: DC | PRN
  Administered 2016-05-02: 300 ug/kg/min via INTRAVENOUS

## 2016-05-02 MED ORDER — PROPOFOL 10 MG/ML IV BOLUS
INTRAVENOUS | Status: AC
  Filled 2016-05-02: qty 40

## 2016-05-02 SURGICAL SUPPLY — 21 items

## 2016-05-02 NOTE — H&P (Signed)
HPI: This is a very pleasant 71 yo man  Chief complaint is personal history of colon cancer  Stage I colon cancer diagnosed 2014, treated with surgery only, this was all done elsewhere. Colonoscopy Dr. Ardis Hughs 12 2014 found normal appearing anastomosis at 15 cm from the anus, no polyps. He was recommended to have repeat colonoscopy at 3 year interval.  ROS: complete GI ROS as described in HPI.  Constitutional:  No unintentional weight loss   Past Medical History:  Diagnosis Date  . A-fib ([place])   . ALCOHOL ABUSE 01/18/2009  . Anxiety   . Colon cancer ([place])   . Coronary artery disease    s/p CABG  . Depression   . 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 03/21/2011 - 2D Echo: EF 25% to 30%. Diffuse hypokinesis.  . Fatigue   . H/O alcohol abuse   . HEMORRHOIDS 01/17/2009  . Hepatitis    hepatitis B-never treated-Titers were low  . HLD (hyperlipidemia)   . HTN (hypertension)   . Insomnia    TAKES ATIVAN TO SLEEP  . Left shoulder pain   . Persistent atrial fibrillation ([place])    diagnosed 2010  . Rhonchi     Past Surgical History:  Procedure Laterality Date  . CARDIAC CATHETERIZATION    . COLON SURGERY  07/2012   COLON CANCER  . COLONOSCOPY N/A 02/11/2013   Procedure: COLONOSCOPY;  Surgeon: Milus Banister, MD;  [facility];  Service: Endoscopy;  Laterality: N/A;  . CORONARY ARTERY BYPASS GRAFT     triple  bypass OCT 2013 in [place] N/A 08/30/2014   Procedure: CARDIOVERSION;  Surgeon: [name], MD;  Location: [facility];  Service: Cardiovascular;  Laterality: N/A;  . ELECTROPHYSIOLOGIC STUDY N/A 09/29/2014   Procedure: CARDIOVERSION;  Surgeon: [name], MD;  Location: [facility];  Service: Cardiovascular;  Laterality: N/A;  . FLEXIBLE SIGMOIDOSCOPY N/A 12/16/2013   Procedure: FLEXIBLE SIGMOIDOSCOPY;  Surgeon: Milus Banister, MD;  [facility];  Service: Endoscopy;  Laterality: N/A;  . HEMORRHOID  SURGERY N/A 03/09/2013   Procedure: HEMORRHOIDECTOMY;  Surgeon: Pedro Earls, MD;  [facility];  Service: General;  Laterality: N/A;  . MOUTH SURGERY      Current Facility-Administered Medications  Medication Dose Route Frequency Provider Last Rate Last Dose  . 0.9 %  sodium chloride infusion   Intravenous Continuous Milus Banister, MD      . lactated ringers infusion   Intravenous Continuous Milus Banister, MD        Allergies as of 04/11/2016  . (No Known Allergies)    Family History  Problem Relation Age of Onset  . Heart disease Father   . Asthma Mother   . Dementia Mother   . Colon cancer Sister 39    Social History   Social History  . Marital status: Divorced    Spouse name: N/A  . Number of children: 2  . Years of education: 12   Occupational History  . RetiredAnimal nutritionist    .  Retired   Social History Main Topics  . Smoking status: Current Every Day Smoker    Packs/day: 0.75    Years: 50.00    Types: Cigarettes  . Smokeless tobacco: Never Used     Comment: tobacco info given 01/07/14  . Alcohol use 0.0 oz/week     Comment: 2-4 drinks per month, previously heavy  . Drug use: No  .  Sexual activity: No   Other Topics Concern  . 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   Has moved homes x 3 in 2 months   States cannot always afford meds-See PI   Drinks 2 cups of coffee a day      Physical Exam: Ht 6\' 5"  (1.956 m)   Wt 220 lb (99.8 kg)   BMI 26.09 kg/m  Constitutional: generally well-appearing Psychiatric: alert and oriented x3 Abdomen: soft, nontender, nondistended, no obvious ascites, no peritoneal signs, normal bowel sounds No peripheral edema noted in lower extremities  Assessment and plan: 71 y.o. male with personal history of colon cancer  For surveillance colonoscopy now.  Please see the "Patient Instructions" section for  addition details about the plan.  [name], MD [facility] 05/02/2016, 10:02 AM

## 2016-05-02 NOTE — Anesthesia Postprocedure Evaluation (Addendum)
Anesthesia Post Note  Patient: [name]  Procedure(s) Performed: Procedure(s) (LRB): COLONOSCOPY WITH PROPOFOL (N/A)  Patient location during evaluation: Endoscopy Anesthesia Type: MAC Level of consciousness: oriented, awake and alert and patient cooperative Pain management: pain level controlled Vital Signs Assessment: post-procedure vital signs reviewed and stable Respiratory status: spontaneous breathing, nonlabored ventilation and respiratory function stable Cardiovascular status: blood pressure returned to baseline and stable Postop Assessment: no signs of nausea or vomiting Anesthetic complications: no       Last Vitals:  Vitals:   05/02/16 1105 05/02/16 1110  BP:  111/88  Pulse: (!) 44 (!) 46  Resp: 16 (!) 22  Temp:      Last Pain:  Vitals:   05/02/16 1046  TempSrc: Oral                 [name],E. Alecsander Hattabaugh

## 2016-05-02 NOTE — Op Note (Signed)
[facility] Patient Name: [name] Procedure Date: 05/02/2016 MRN: [id number] Attending MD: Milus Banister , MD Date of Birth: 1945/05/24 CSN: [id number] Age: 71 Admit Type: Outpatient Procedure:                Colonoscopy Indications:              High risk colon cancer surveillance: Personal                            history of colon cancer: Stage I colon cancer                            diagnosed 2014, treated with surgery only, this was                            all done elsewhere. Colonoscopy Dr. Ardis Hughs 12 2014                            found normal appearing anastomosis at 15 cm from                            the anus, no polyps. Providers:                Milus Banister, MD, Kingsley Plan, RN, Corliss Parish, Technician Referring MD:              Medicines:                Monitored Anesthesia Care Complications:            No immediate complications. Estimated blood loss:                            None. Estimated Blood Loss:     Estimated blood loss: none. Procedure:                Pre-Anesthesia Assessment:                           - Prior to the procedure, a History and Physical                            was performed, and patient medications and                            allergies were reviewed. The patient's tolerance of                            previous anesthesia was also reviewed. The risks                            and benefits of the procedure and the sedation                            options  and risks were discussed with the patient.                            All questions were answered, and informed consent                            was obtained. Prior Anticoagulants: The patient has                            taken Eliquis (apixaban), last dose was 2 days                            prior to procedure. ASA Grade Assessment: III - A                            patient with severe systemic disease.  After                            reviewing the risks and benefits, the patient was                            deemed in satisfactory condition to undergo the                            procedure.                           After obtaining informed consent, the colonoscope                            was passed under direct vision. Throughout the                            procedure, the patient's blood pressure, pulse, and                            oxygen saturations were monitored continuously. The                            [id number]) scope was introduced through                            the anus and advanced to the the cecum, identified                            by appendiceal orifice and ileocecal valve. The                            colonoscopy was performed without difficulty. The                            patient tolerated the procedure well. The quality  of the bowel preparation was adequate. The                            ileocecal valve, appendiceal orifice, and rectum                            were photographed. Scope In: 10:23:41 AM Scope Out: 10:38:25 AM Scope Withdrawal Time: 0 hours 10 minutes 43 seconds  Total Procedure Duration: 0 hours 14 minutes 44 seconds  Findings:      There was evidence of a prior end-to-end colo-colonic anastomosis in the       sigmoid colon.      A 7 mm polyp was found in the transverse colon. The polyp was sessile.       The polyp was removed with a cold snare. Resection and retrieval were       complete.      The exam was otherwise without abnormality on direct and retroflexion       views. Impression:               - End-to-end colo-colonic anastomosis.                           - One 7 mm polyp in the transverse colon, removed                            with a cold snare. Resected and retrieved.                           - The examination was otherwise normal on direct                            and  retroflexion views. Moderate Sedation:      N/A- Per Anesthesia Care Recommendation:           - Patient has a contact number available for                            emergencies. The signs and symptoms of potential                            delayed complications were discussed with the                            patient. Return to normal activities tomorrow.                            Written discharge instructions were provided to the                            patient.                           - Resume previous diet.                           - Continue present medications.  If the polyp(s) is proven to be 'pre-cancerous' on                            pathology, you will need repeat colonoscopy in 5                            years. Procedure Code(s):        --- Professional ---                           [phone], Colonoscopy, flexible; with removal of                            tumor(s), polyp(s), or other lesion(s) by snare                            technique Diagnosis Code(s):        --- Professional ---                           [id number], Personal history of other malignant                            neoplasm of large intestine                           Z98.0, Intestinal bypass and anastomosis status                           D12.3, Benign neoplasm of transverse colon (hepatic                            flexure or splenic flexure) CPT copyright 2016 American Medical Association. All rights reserved. The codes documented in this report are preliminary and upon coder review may  be revised to meet current compliance requirements. Milus Banister, MD 05/02/2016 10:44:27 AM This report has been signed electronically. Number of Addenda: 0

## 2016-05-02 NOTE — Discharge Instructions (Signed)

## 2016-05-02 NOTE — Transfer of Care (Signed)
Immediate Anesthesia Transfer of Care Note  Patient: [name]  Procedure(s) Performed: Procedure(s): COLONOSCOPY WITH PROPOFOL (N/A)  Patient Location: PACU  Anesthesia Type:MAC  Level of Consciousness: sedated and patient cooperative  Airway & Oxygen Therapy: Patient Spontanous Breathing and Patient connected to face mask oxygen  Post-op Assessment: Report given to RN and Post -op Vital signs reviewed and stable  Post vital signs: stable  Last Vitals:  Vitals:   05/02/16 0957  BP: (!) 127/91  Pulse: 63  Resp: 15  Temp: 36.5 C    Last Pain:  Vitals:   05/02/16 0957  TempSrc: Oral         Complications: No apparent anesthesia complications

## 2016-05-02 NOTE — Anesthesia Preprocedure Evaluation (Addendum)
Anesthesia Evaluation  Patient identified by MRN, date of birth, ID band Patient awake    Reviewed: Allergy & Precautions, NPO status , Patient's Chart, lab work & pertinent test results, reviewed documented beta blocker date and time   History of Anesthesia Complications Negative for: history of anesthetic complications  Airway Mallampati: I  TM Distance: >3 FB Neck ROM: Full    Dental  (+) Upper Dentures, Lower Dentures, Dental Advisory Given   Pulmonary COPD, Current Smoker,    breath sounds clear to auscultation       Cardiovascular hypertension, Pt. on home beta blockers and Pt. on medications (-) angina+ CAD and + CABG  + dysrhythmias Atrial Fibrillation  Rhythm:Irregular Rate:Normal  '13 - 2D Echo: EF 25% to 30%. Diffuse hypokinesis.   Neuro/Psych Anxiety Depression negative neurological ROS     GI/Hepatic (+)     substance abuse  alcohol use, Hepatitis -, BColon cancer   Endo/Other  negative endocrine ROS  Renal/GU negative Renal ROS     Musculoskeletal   Abdominal   Peds  Hematology  (+) Blood dyscrasia (eliquis: last dose Tuesday am), ,   Anesthesia Other Findings   Reproductive/Obstetrics                            Anesthesia Physical Anesthesia Plan  ASA: III  Anesthesia Plan: MAC   Post-op Pain Management:    Induction: Intravenous  Airway Management Planned: Natural Airway  Additional Equipment:   Intra-op Plan:   Post-operative Plan:   Informed Consent: I have reviewed the patients History and Physical, chart, labs and discussed the procedure including the risks, benefits and alternatives for the proposed anesthesia with the patient or authorized representative who has indicated his/her understanding and acceptance.   Dental advisory given  Plan Discussed with: CRNA and Surgeon  Anesthesia Plan Comments: (Plan routine monitors, MAC)         Anesthesia Quick Evaluation

## 2016-05-03 ENCOUNTER — Encounter (HOSPITAL_COMMUNITY): Payer: Self-pay | Admitting: Gastroenterology

## 2016-06-03 ENCOUNTER — Telehealth (HOSPITAL_COMMUNITY): Payer: Self-pay

## 2016-06-03 NOTE — Telephone Encounter (Signed)
Patient is calling because Ambien is no longer working. He said he is taking 12.5 mg at night along with a 3 mg melatonin. He goes to bed at 9:30 and goes to sleep fine, but then he wakes up at about 1 am and is wide awake. Please review and advise, thank you

## 2016-06-05 MED ORDER — DOXEPIN HCL 3 MG PO TABS: 3.0000 mg | ORAL_TABLET | Freq: Every day | ORAL | 0 refills | Status: DC

## 2016-06-05 NOTE — Telephone Encounter (Signed)
Per Dr. Casimiro Needle, I sent an order to the pharmacy for the Doxepin 3 mg. This is to me taken in conjunction with the Ambien. I called patient to let him know and told him to call me Monday and let me know how he is doing

## 2016-08-06 ENCOUNTER — Other Ambulatory Visit: Payer: Self-pay | Admitting: Gastroenterology

## 2016-08-07 MED ORDER — POLYETHYLENE GLYCOL 3350 17 G PO PACK: 17.0000 g | PACK | Freq: Every day | ORAL | 3 refills | Status: AC | PRN

## 2016-08-07 NOTE — Telephone Encounter (Signed)
Prescription sent pt aware 

## 2016-08-14 ENCOUNTER — Ambulatory Visit (HOSPITAL_COMMUNITY): Payer: Self-pay | Admitting: Psychiatry

## 2016-09-09 ENCOUNTER — Other Ambulatory Visit (HOSPITAL_COMMUNITY): Payer: Self-pay

## 2016-09-09 MED ORDER — ZOLPIDEM TARTRATE ER 12.5 MG PO TBCR: 12.5000 mg | EXTENDED_RELEASE_TABLET | Freq: Every evening | ORAL | 0 refills | Status: DC | PRN

## 2016-09-23 NOTE — Addendum Note (Signed)
Addendum  created 09/23/16 1615 by Annye Asa, MD   Delete clinical note, Sign clinical note

## 2016-09-23 NOTE — Addendum Note (Signed)
Addendum  created 09/23/16 1548 by Annye Asa, MD   Sign clinical note

## 2016-09-23 NOTE — Anesthesia Postprocedure Evaluation (Deleted)
Anesthesia Post Note  Patient: [name]  Procedure(s) Performed: Procedure(s) (LRB): COLONOSCOPY WITH PROPOFOL (N/A)     Anesthesia Post Evaluation  Last Vitals:  Vitals:   05/02/16 1105 05/02/16 1110  BP:  111/88  Pulse: (!) 44 (!) 46  Resp: 16 (!) 22  Temp:      Last Pain:  Vitals:   05/06/16 1425  TempSrc:   PainSc: 0-No pain                 Domenik Trice,E. Kerri-Anne Haeberle

## 2016-10-11 ENCOUNTER — Other Ambulatory Visit (HOSPITAL_COMMUNITY): Payer: Self-pay

## 2016-10-11 MED ORDER — ZOLPIDEM TARTRATE ER 12.5 MG PO TBCR: 12.5000 mg | EXTENDED_RELEASE_TABLET | Freq: Every evening | ORAL | 0 refills | Status: DC | PRN

## 2016-10-11 MED ORDER — DOXEPIN HCL 3 MG PO TABS: 3.0000 mg | ORAL_TABLET | Freq: Every day | ORAL | 0 refills | Status: DC

## 2016-10-30 ENCOUNTER — Ambulatory Visit (INDEPENDENT_AMBULATORY_CARE_PROVIDER_SITE_OTHER): Payer: Medicare Other | Admitting: Psychiatry

## 2016-10-30 ENCOUNTER — Encounter (HOSPITAL_COMMUNITY): Payer: Self-pay | Admitting: Psychiatry

## 2016-10-30 VITALS — BP 124/72 | HR 60 | Ht 77.0 in | Wt 215.8 lb

## 2016-10-30 DIAGNOSIS — F17210 Nicotine dependence, cigarettes, uncomplicated: Secondary | ICD-10-CM | POA: Diagnosis not present

## 2016-10-30 DIAGNOSIS — Z810 Family history of intellectual disabilities: Secondary | ICD-10-CM

## 2016-10-30 DIAGNOSIS — F5101 Primary insomnia: Secondary | ICD-10-CM

## 2016-10-30 MED ORDER — ZOLPIDEM TARTRATE ER 12.5 MG PO TBCR: 12.5000 mg | EXTENDED_RELEASE_TABLET | Freq: Every evening | ORAL | 5 refills | Status: DC | PRN

## 2016-10-30 NOTE — Progress Notes (Signed)
[facility] MD Progress Note  10/30/2016 4:43 PM [name]  MRN:  [id number] Subjective:  Sleeping well Principal Problem: Insomnia Diagnosis:  Primary insomnia At this time the patient should continue taking Ambien 12.5 CR. There was a lot of confusion as he missed a visit because of family issues. Given a return appointment this time in 4 months. This patient takes his medicines just as prescribed. Is no use of drugs or alcohol. Taking 12.5 mg Ambien CR he sleeps about 6 hours and has no daytime dysfunction at all. Takes no naps and doesn't feel sleepy and dysfunctional. The patient is very stable. His mood is good. Patient Active Problem List   Diagnosis Date Noted  . Benign neoplasm of transverse colon [D12.3]   . History of colon cancer [Z85.038] 01/19/2016  . Chronic anticoagulation [Z79.01] 01/19/2016  . TI (tricuspid incompetence) [I07.1] 07/28/2014  . Benign essential HTN [I10] 07/22/2014  . Arteriosclerosis of coronary artery [I25.10] 02/10/2014  . MI (mitral incompetence) [I34.0] 02/10/2014  . Persistent atrial fibrillation ([place]) [I48.1] 02/10/2014  . Constipation [K59.00] 01/10/2014  . Pulmonary nodule, left [R91.1] 11/29/2013  . Anxiety and depression [F41.9, F32.9] 07/27/2013  . Chest pain, unspecified [R07.9] 07/27/2013  . History of alcoholism ([place]) [F10.21] 07/27/2013  . History of tobacco use [Z87.891] 07/27/2013  . 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 H/O coronary artery bypass surgery [Z95.1] 07/27/2013  . Breath shortness [R06.02] 07/27/2013  . Status post hemorrhoidectomy Dec 2014 [Z98.890, Z87.19] 03/10/2013  . Hx of CABG [Z95.1] 01/22/2013  . Insomnia [G47.00] 09/30/2012  . History of hepatitis B [Z86.19] 09/09/2012  . Colon cancer ([place]) [C18.9] 07/28/2012  . Thrombocytopenia ([place]) [D69.6] 11/04/2011  . Chest pain [R07.9] 11/04/2011  . Chronic systolic heart failure ([place]) [I50.22] 11/03/2011  . 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 Alcohol abuse [F10.10] 01/18/2009  . TOBACCO ABUSE [F17.200] 01/18/2009  . DEPRESSION [F32.9] 01/17/2009  . Atrial fibrillation ([place]) [I48.91] 01/17/2009   Total Time spent with patient: 15 minutes  Past Psychiatric History:    Past Medical History:  Past Medical History:  Diagnosis Date  . A-fib ([place])   . ALCOHOL ABUSE 01/18/2009  . Anxiety   . Colon cancer ([place])   . Coronary artery disease    s/p CABG  . Depression   . 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 03/21/2011 - 2D Echo: EF 25% to 30%. Diffuse hypokinesis.  . Fatigue   . H/O alcohol abuse   . HEMORRHOIDS 01/17/2009  . Hepatitis    hepatitis B-never treated-Titers were low  . HLD (hyperlipidemia)   . HTN (hypertension)   . Insomnia    TAKES ATIVAN TO SLEEP  . Left shoulder pain   . Persistent atrial fibrillation ([place])    diagnosed 2010  . Rhonchi     Past Surgical History:  Procedure Laterality Date  . CARDIAC CATHETERIZATION    . COLON SURGERY  07/2012   COLON CANCER  . COLONOSCOPY N/A 02/11/2013   Procedure: COLONOSCOPY;  Surgeon: Milus Banister, MD;  Location: [facility];  Service: Endoscopy;  Laterality: N/A;  . COLONOSCOPY WITH PROPOFOL N/A 05/02/2016   Procedure: COLONOSCOPY WITH PROPOFOL;  Surgeon: Milus Banister, MD;  Location: [facility];  Service: Endoscopy;  Laterality: N/A;  . CORONARY ARTERY BYPASS GRAFT     triple  bypass OCT 2013 in [place] N/A 08/30/2014   Procedure: CARDIOVERSION;  Surgeon: Darnell Level  Kelli Hope, MD;  Location: [facility]S;  Service: Cardiovascular;  Laterality: N/A;  . ELECTROPHYSIOLOGIC STUDY N/A 09/29/2014   Procedure: CARDIOVERSION;  Surgeon: [name], MD;  Location: [facility];  Service: Cardiovascular;  Laterality: N/A;  . FLEXIBLE SIGMOIDOSCOPY N/A 12/16/2013   Procedure: FLEXIBLE SIGMOIDOSCOPY;  Surgeon: Milus Banister, MD;  Location: [facility];  Service: Endoscopy;  Laterality: N/A;   . HEMORRHOID SURGERY N/A 03/09/2013   Procedure: HEMORRHOIDECTOMY;  Surgeon: Pedro Earls, MD;  Location: [facility];  Service: General;  Laterality: N/A;  . MOUTH SURGERY     Family History:  Family History  Problem Relation Age of Onset  . Heart disease Father   . Asthma Mother   . Dementia Mother   . Colon cancer Sister 81   Family Psychiatric  History:   Social History:  History  Alcohol Use  . 0.0 oz/week    Comment: 2-4 drinks per month, previously heavy     History  Drug Use No    Social History   Social History  . Marital status: Divorced    Spouse name: N/A  . Number of children: 2  . Years of education: 12   Occupational History  . RetiredAnimal nutritionist    .  Retired   Social History Main Topics  . Smoking status: Current Every Day Smoker    Packs/day: 0.75    Years: 50.00    Types: Cigarettes  . Smokeless tobacco: Never Used     Comment: tobacco info given 01/07/14  . Alcohol use 0.0 oz/week     Comment: 2-4 drinks per month, previously heavy  . Drug use: No  . Sexual activity: No   Other Topics Concern  . 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   Has moved homes x 3 in 2 months   States cannot always afford meds-See PI   Drinks 2 cups of coffee a day    Additional Social History:                         Sleep: Good  Appetite:  Good  Current Medications: Current Outpatient Prescriptions  Medication Sig Dispense Refill  . amiodarone (PACERONE) 200 MG tablet Take one tablet by mouth twice daily (Patient taking differently: Take 200 mg by mouth daily. ) 60 tablet 3  . apixaban (ELIQUIS) 5 MG TABS tablet Take 5 mg by mouth 2 (two) times daily.    . Doxepin HCl (SILENOR) 3 MG TABS Take 1 tablet (3 mg total) by mouth at bedtime. 30 tablet 0  . metoprolol succinate (TOPROL-XL) 25 MG 24 hr tablet Take 25 mg by mouth daily.    Marland Kitchen zolpidem (AMBIEN  CR) 12.5 MG CR tablet Take 1 tablet (12.5 mg total) by mouth at bedtime as needed for sleep. 30 tablet 5   No current facility-administered medications for this visit.     Lab Results: No results found for this or any previous visit (from the past 48 hour(s)).  Blood Alcohol level:  Lab Results  Component Value Date   ETH <11 12/01/2011   ETH 118 (H) 11/03/2011    Physical Findings: AIMS:  , ,  ,  ,    CIWA:    COWS:     Musculoskeletal: Strength & Muscle Tone: within normal limits Gait & Station: normal Patient leans: N/A  Psychiatric Specialty Exam: ROS  Blood pressure 124/72, pulse 60, height 6\' 5"  (1.956 m), weight 215 lb 12.8 oz (97.9 kg).Body mass index is 25.59 kg/m.  General Appearance: Casual  Eye Contact::  Good  Speech:  Clear and Coherent  Volume:  Normal  Mood:  Euthymic  Affect:  Congruent  Thought Process:  Coherent  Orientation:  Full (Time, Place, and Person)  Thought Content:  WDL  Suicidal Thoughts:  No  Homicidal Thoughts:  No  Memory:  NA  Judgement:  Good  Insight:  Good  Psychomotor Activity:  Normal  Concentration:  Good  Recall:  Good  Fund of Knowledge:Good  Language: Good  Akathisia:  No  Handed:  Right  AIMS (if indicated):     Assets:  Communication Skills  ADL's:  Intact  Cognition: WNL  Sleep:      Treatment Plan Summary: At this time the patient received a prescription for Ambien 12.5 CR. He received a prescription for doxepin for sleep but that was only around confusion of whether or not on a return. Today I strongly is doing well. He is very stable. I reviewed his chart and the fact that he has a primary sleep disorder. He will return to see me in 4 months for a 15 minute visit. Jerral Ralph, MD 10/30/2016, 4:43 PM Patient ID: [name], male   DOB: 11-24-1945, 71 y.o.   MRN: [id number]

## 2017-02-26 ENCOUNTER — Ambulatory Visit (HOSPITAL_COMMUNITY): Payer: Self-pay | Admitting: Psychiatry

## 2017-03-11 HISTORY — PX: APPENDECTOMY: SHX54

## 2017-05-07 ENCOUNTER — Encounter (HOSPITAL_COMMUNITY): Payer: Self-pay | Admitting: Psychiatry

## 2017-05-07 ENCOUNTER — Ambulatory Visit (INDEPENDENT_AMBULATORY_CARE_PROVIDER_SITE_OTHER): Payer: Medicare Other | Admitting: Psychiatry

## 2017-05-07 VITALS — BP 128/80 | HR 62 | Ht 77.0 in | Wt 220.0 lb

## 2017-05-07 DIAGNOSIS — Z810 Family history of intellectual disabilities: Secondary | ICD-10-CM

## 2017-05-07 DIAGNOSIS — F17210 Nicotine dependence, cigarettes, uncomplicated: Secondary | ICD-10-CM

## 2017-05-07 DIAGNOSIS — F5101 Primary insomnia: Secondary | ICD-10-CM

## 2017-05-07 MED ORDER — ZOLPIDEM TARTRATE ER 12.5 MG PO TBCR
12.5000 mg | EXTENDED_RELEASE_TABLET | Freq: Every evening | ORAL | 5 refills | Status: DC | PRN
Start: 2017-05-07 — End: 2017-11-21

## 2017-05-07 NOTE — Progress Notes (Signed)
[facility] MD Progress Note  05/07/2017 2:10 PM [name]  MRN:  [id number] Subjective:  Sleeping well Principal Problem: Insomnia Diagnosis:  Primary insomnia Today the patient is doing well. His Ambien clearly helps him fall off to sleep with it but 10 minutes. Normally would take him over an hour. He sleeps about 5 or 6 hours and feels pretty well rested. He doesn't take naps does not often sleep. His mood is good. He denies the use of alcohol or drugs. His sleep study demonstrated restless leg syndrome. He also seems to have problems falling off to sleep. I believe he has primary insomnia medically he is very stable. He is heart disease and takes medications for this a takes a blood thinner as well. The patient is in good spirits. He is moving the [employer]. He seems to like. He'll continue coming to see me but is going to start at a new primary care doctor's office with [facility]. I talked to him about that they will prescribedhis antianxiety will not to drive 40 minutes to see me. Patient Active Problem List   Diagnosis Date Noted  . Benign neoplasm of transverse colon [D12.3]   . History of colon cancer [Z85.038] 01/19/2016  . Chronic anticoagulation [Z79.01] 01/19/2016  . TI (tricuspid incompetence) [I07.1] 07/28/2014  . Benign essential HTN [I10] 07/22/2014  . Arteriosclerosis of coronary artery [I25.10] 02/10/2014  . MI (mitral incompetence) [I34.0] 02/10/2014  . Persistent atrial fibrillation ([place]) [I48.1] 02/10/2014  . Constipation [K59.00] 01/10/2014  . Pulmonary nodule, left [R91.1] 11/29/2013  . Anxiety and depression [F41.9, F32.9] 07/27/2013  . Chest pain, unspecified [R07.9] 07/27/2013  . History of alcoholism ([place]) [F10.21] 07/27/2013  . History of tobacco use [Z87.891] 07/27/2013  . 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 H/O coronary artery bypass surgery [Z95.1] 07/27/2013  . Breath shortness [R06.02] 07/27/2013  .  Status post hemorrhoidectomy Dec 2014 [Z98.890, Z87.19] 03/10/2013  . Hx of CABG [Z95.1] 01/22/2013  . Insomnia [G47.00] 09/30/2012  . History of hepatitis B [Z86.19] 09/09/2012  . Colon cancer ([place]) [C18.9] 07/28/2012  . Thrombocytopenia ([place]) [D69.6] 11/04/2011  . Chest pain [R07.9] 11/04/2011  . Chronic systolic heart failure ([place]) [I50.22] 11/03/2011  . 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 Alcohol abuse [F10.10] 01/18/2009  . TOBACCO ABUSE [F17.200] 01/18/2009  . DEPRESSION [F32.9] 01/17/2009  . Atrial fibrillation ([place]) [I48.91] 01/17/2009   Total Time spent with patient: 15 minutes  Past Psychiatric History:    Past Medical History:  Past Medical History:  Diagnosis Date  . A-fib ([place])   . ALCOHOL ABUSE 01/18/2009  . Anxiety   . Colon cancer ([place])   . Coronary artery disease    s/p CABG  . Depression   . 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 03/21/2011 - 2D Echo: EF 25% to 30%. Diffuse hypokinesis.  . Fatigue   . H/O alcohol abuse   . HEMORRHOIDS 01/17/2009  . Hepatitis    hepatitis B-never treated-Titers were low  . HLD (hyperlipidemia)   . HTN (hypertension)   . Insomnia    TAKES ATIVAN TO SLEEP  . Left shoulder pain   . Persistent atrial fibrillation ([place])    diagnosed 2010  . Rhonchi     Past Surgical History:  Procedure Laterality Date  . CARDIAC CATHETERIZATION    . COLON SURGERY  07/2012   COLON CANCER  . COLONOSCOPY N/A 02/11/2013   Procedure: COLONOSCOPY;  Surgeon:  Milus Banister, MD;  Location: [facility];  Service: Endoscopy;  Laterality: N/A;  . COLONOSCOPY WITH PROPOFOL N/A 05/02/2016   Procedure: COLONOSCOPY WITH PROPOFOL;  Surgeon: Milus Banister, MD;  [facility];  Service: Endoscopy;  Laterality: N/A;  . CORONARY ARTERY BYPASS GRAFT     triple  bypass OCT 2013 in [place] N/A 08/30/2014   Procedure: CARDIOVERSION;  Surgeon: [name], MD;   Location: [facility];  Service: Cardiovascular;  Laterality: N/A;  . ELECTROPHYSIOLOGIC STUDY N/A 09/29/2014   Procedure: CARDIOVERSION;  Surgeon: [name], MD;  Location: [facility];  Service: Cardiovascular;  Laterality: N/A;  . FLEXIBLE SIGMOIDOSCOPY N/A 12/16/2013   Procedure: FLEXIBLE SIGMOIDOSCOPY;  Surgeon: Milus Banister, MD;  Location: [facility];  Service: Endoscopy;  Laterality: N/A;  . HEMORRHOID SURGERY N/A 03/09/2013   Procedure: HEMORRHOIDECTOMY;  Surgeon: Pedro Earls, MD;  Location: [facility];  Service: General;  Laterality: N/A;  . MOUTH SURGERY     Family History:  Family History  Problem Relation Age of Onset  . Heart disease Father   . Asthma Mother   . Dementia Mother   . Colon cancer Sister 57   Family Psychiatric  History:   Social History:  Social History   Substance and Sexual Activity  Alcohol Use Yes  . Alcohol/week: 0.0 oz   Comment: 2-4 drinks per month, previously heavy     Social History   Substance and Sexual Activity  Drug Use No    Social History   Socioeconomic History  . Marital status: Divorced    Spouse name: None  . Number of children: 2  . Years of education: 24  . Highest education level: None  Social Needs  . Financial resource strain: None  . Food insecurity - worry: None  . Food insecurity - inability: None  . Transportation needs - medical: None  . Transportation needs - non-medical: None  Occupational History  . Occupation: RetiredLand: RETIRED  Tobacco Use  . Smoking status: Current Every Day Smoker    Packs/day: 0.75    Years: 50.00    Pack years: 37.50    Types: Cigarettes  . Smokeless tobacco: Never Used  . Tobacco comment: tobacco info given 01/07/14  Substance and Sexual Activity  . Alcohol use: Yes    Alcohol/week: 0.0 oz    Comment: 2-4 drinks per month, previously heavy  . Drug use: No  . Sexual activity: No  Other Topics Concern  . 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   Has moved homes x 3 in 2 months   States cannot always afford meds-See PI   Drinks 2 cups of coffee a day    Additional Social History:                         Sleep: Good  Appetite:  Good  Current Medications: Current Outpatient Medications  Medication Sig Dispense Refill  . amiodarone (PACERONE) 200 MG tablet Take one tablet by mouth twice daily (Patient taking differently: Take 200 mg by mouth daily. ) 60 tablet 3  . apixaban (ELIQUIS) 5 MG TABS tablet Take 5 mg by mouth 2 (two) times daily.    . metoprolol succinate (TOPROL-XL) 25 MG 24 hr tablet Take 25 mg by mouth daily.    Marland Kitchen  zolpidem (AMBIEN CR) 12.5 MG CR tablet Take 1 tablet (12.5 mg total) by mouth at bedtime as needed for sleep. 30 tablet 5   No current facility-administered medications for this visit.     Lab Results: No results found for this or any previous visit (from the past 48 hour(s)).  Blood Alcohol level:  Lab Results  Component Value Date   ETH <11 12/01/2011   ETH 118 (H) 11/03/2011    Physical Findings: AIMS:  , ,  ,  ,    CIWA:    COWS:     Musculoskeletal: Strength & Muscle Tone: within normal limits Gait & Station: normal Patient leans: N/A  Psychiatric Specialty Exam: ROS  Blood pressure 128/80, pulse 62, height 6\' 5"  (1.956 m), weight 220 lb (99.8 kg).Body mass index is 26.09 kg/m.  General Appearance: Casual  Eye Contact::  Good  Speech:  Clear and Coherent  Volume:  Normal  Mood:  Euthymic  Affect:  Congruent  Thought Process:  Coherent  Orientation:  Full (Time, Place, and Person)  Thought Content:  WDL  Suicidal Thoughts:  No  Homicidal Thoughts:  No  Memory:  NA  Judgement:  Good  Insight:  Good  Psychomotor Activity:  Normal  Concentration:  Good  Recall:  Good  Fund of Knowledge:Good  Language: Good  Akathisia:  No  Handed:  Right  AIMS (if indicated):      Assets:  Communication Skills  ADL's:  Intact  Cognition: WNL  Sleep:      Treatment Plan Summary: 05/07/2017, 2:10 PM Patient ID: [name], male   DOB: 1945/09/15, 72 y.o.   MRN: [id number] At this time the patient continue taking Ambien 12.5 CR. He did take something for restless legs but is not sure never worked. What works is to take this consistently medicine Ambien. The patient is functioning very well.He denies use of drugs or alcohol. He is no evidence of psychosis. He is very stable. He'll return to see me in 5 months for a 15 minute visit last his primary care doctor takes over-the-counter prescription for Ambien.

## 2017-10-01 ENCOUNTER — Ambulatory Visit (HOSPITAL_COMMUNITY): Payer: Self-pay | Admitting: Psychiatry

## 2017-11-21 ENCOUNTER — Encounter (HOSPITAL_COMMUNITY): Payer: Self-pay | Admitting: Psychiatry

## 2017-11-21 ENCOUNTER — Ambulatory Visit (INDEPENDENT_AMBULATORY_CARE_PROVIDER_SITE_OTHER): Payer: Medicare Other | Admitting: Psychiatry

## 2017-11-21 VITALS — BP 126/74 | HR 64 | Ht 77.0 in | Wt 213.8 lb

## 2017-11-21 DIAGNOSIS — F5101 Primary insomnia: Secondary | ICD-10-CM | POA: Diagnosis not present

## 2017-11-21 DIAGNOSIS — Z79899 Other long term (current) drug therapy: Secondary | ICD-10-CM

## 2017-11-21 DIAGNOSIS — F17210 Nicotine dependence, cigarettes, uncomplicated: Secondary | ICD-10-CM | POA: Diagnosis not present

## 2017-11-21 MED ORDER — ZOLPIDEM TARTRATE ER 12.5 MG PO TBCR: 12.5000 mg | EXTENDED_RELEASE_TABLET | Freq: Every evening | ORAL | 5 refills | Status: DC | PRN

## 2017-11-21 NOTE — Progress Notes (Signed)
[facility] MD Progress Note  11/21/2017 10:17 AM [name]  MRN:  [id number] Subjective:  Sleeping well Principal Problem: Insomnia Diagnosis:  Primary insomnia  Today the patient is at his baseline. He is being seen for primary insomnia. The patient has no evidence of being depressed. He drinks no alcohol uses no drugs. The patient gets approximately 5-6 hours of sleep. He does not take naps during the day and really doesn't feel sleepy. With efforts in the past to reduce his Ambien it ended up that he couldn't sleep much more than an hour to. He was miserable. The patient denies shortness of breath or chest pain. He denies any neurological symptoms he takes his Ambien just as prescribed. His mood is actually very stable. He's just started with a new primary care doctor and hopefully in the next 5 months we'll talk to them about prescribing his Ambien so won't have to, severe Parkinson's disease me. The patient is very stable. Is not suicidal. He's actually functioning very well. Patient Active Problem List   Diagnosis Date Noted  . Benign neoplasm of transverse colon [D12.3]   . History of colon cancer [Z85.038] 01/19/2016  . Chronic anticoagulation [Z79.01] 01/19/2016  . TI (tricuspid incompetence) [I07.1] 07/28/2014  . Benign essential HTN [I10] 07/22/2014  . Arteriosclerosis of coronary artery [I25.10] 02/10/2014  . MI (mitral incompetence) [I34.0] 02/10/2014  . Persistent atrial fibrillation ([place]) [I48.1] 02/10/2014  . Constipation [K59.00] 01/10/2014  . Pulmonary nodule, left [R91.1] 11/29/2013  . Anxiety and depression [F41.9, F32.9] 07/27/2013  . Chest pain, unspecified [R07.9] 07/27/2013  . History of alcoholism ([place]) [F10.21] 07/27/2013  . History of tobacco use [Z87.891] 07/27/2013  . 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 H/O coronary artery bypass surgery [Z95.1] 07/27/2013  . Breath shortness [R06.02] 07/27/2013  . Status post hemorrhoidectomy  Dec 2014 [Z98.890, Z87.19] 03/10/2013  . Hx of CABG [Z95.1] 01/22/2013  . Insomnia [G47.00] 09/30/2012  . History of hepatitis B [Z86.19] 09/09/2012  . Colon cancer ([name]) [C18.9] 07/28/2012  . Thrombocytopenia ([name]) [D69.6] 11/04/2011  . Chest pain [R07.9] 11/04/2011  . Chronic systolic heart failure ([name]) [I50.22] 11/03/2011  . 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 Alcohol abuse [F10.10] 01/18/2009  . TOBACCO ABUSE [F17.200] 01/18/2009  . DEPRESSION [F32.9] 01/17/2009  . Atrial fibrillation ([place]) [I48.91] 01/17/2009   Total Time spent with patient: 15 minutes  Past Psychiatric History:    Past Medical History:  Past Medical History:  Diagnosis Date  . A-fib ([place])   . ALCOHOL ABUSE 01/18/2009  . Anxiety   . Colon cancer ([name])   . Coronary artery disease    s/p CABG  . Depression   . 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 03/21/2011 - 2D Echo: EF 25% to 30%. Diffuse hypokinesis.  . Fatigue   . H/O alcohol abuse   . HEMORRHOIDS 01/17/2009  . Hepatitis    hepatitis B-never treated-Titers were low  . HLD (hyperlipidemia)   . HTN (hypertension)   . Insomnia    TAKES ATIVAN TO SLEEP  . Left shoulder pain   . Persistent atrial fibrillation ([name])    diagnosed 2010  . Rhonchi     Past Surgical History:  Procedure Laterality Date  . APPENDECTOMY  2019  . CARDIAC CATHETERIZATION    . COLON SURGERY  07/2012   COLON CANCER  . COLONOSCOPY N/A 02/11/2013   Procedure: COLONOSCOPY;  Surgeon: Milus Banister, MD;  Location:  [facility];  Service: Endoscopy;  Laterality: N/A;  . COLONOSCOPY WITH PROPOFOL N/A 05/02/2016   Procedure: COLONOSCOPY WITH PROPOFOL;  Surgeon: Milus Banister, MD;  Location: [facility];  Service: Endoscopy;  Laterality: N/A;  . CORONARY ARTERY BYPASS GRAFT     triple  bypass OCT 2013 in [place] N/A 08/30/2014   Procedure: CARDIOVERSION;  Surgeon: [name], MD;  Location:  [facility];  Service: Cardiovascular;  Laterality: N/A;  . ELECTROPHYSIOLOGIC STUDY N/A 09/29/2014   Procedure: CARDIOVERSION;  Surgeon: [name], MD;  Location: [facility];  Service: Cardiovascular;  Laterality: N/A;  . FLEXIBLE SIGMOIDOSCOPY N/A 12/16/2013   Procedure: FLEXIBLE SIGMOIDOSCOPY;  Surgeon: Milus Banister, MD;  Location: [facility];  Service: Endoscopy;  Laterality: N/A;  . HEMORRHOID SURGERY N/A 03/09/2013   Procedure: HEMORRHOIDECTOMY;  Surgeon: Pedro Earls, MD;  Location: [facility];  Service: General;  Laterality: N/A;  . MOUTH SURGERY     Family History:  Family History  Problem Relation Age of Onset  . Heart disease Father   . Asthma Mother   . Dementia Mother   . Colon cancer Sister 26   Family Psychiatric  History:   Social History:  Social History   Substance and Sexual Activity  Alcohol Use Yes  . Alcohol/week: 0.0 standard drinks   Comment: 2-4 drinks per month, previously heavy     Social History   Substance and Sexual Activity  Drug Use No    Social History   Socioeconomic History  . Marital status: Divorced    Spouse name: Not on file  . Number of children: 2  . Years of education: 41  . Highest education level: Not on file  Occupational History  . Occupation: [employer]: RETIRED  Social Needs  . Financial resource strain: Not on file  . Food insecurity:    Worry: Not on file    Inability: Not on file  . Transportation needs:    Medical: Not on file    Non-medical: Not on file  Tobacco Use  . Smoking status: Current Every Day Smoker    Packs/day: 0.75    Years: 50.00    Pack years: 37.50    Types: Cigarettes  . Smokeless tobacco: Never Used  . Tobacco comment: tobacco info given 01/07/14  Substance and Sexual Activity  . Alcohol use: Yes    Alcohol/week: 0.0 standard drinks    Comment: 2-4 drinks per month, previously heavy  . Drug use: No  . Sexual activity: Never  Lifestyle  . Physical  activity:    Days per week: Not on file    Minutes per session: Not on file  . Stress: Not on file  Relationships  . Social connections:    Talks on phone: Not on file    Gets together: Not on file    Attends religious service: Not on file    Active member of club or organization: Not on file    Attends meetings of clubs or organizations: Not on file    Relationship status: Not on file  Other Topics Concern  . 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   Has moved homes x 3 in 2 months   States cannot always afford meds-See PI   Drinks 2 cups of coffee a day  Additional Social History:                         Sleep: Good  Appetite:  Good  Current Medications: Current Outpatient Medications  Medication Sig Dispense Refill  . amiodarone (PACERONE) 200 MG tablet Take one tablet by mouth twice daily (Patient taking differently: Take 200 mg by mouth daily. ) 60 tablet 3  . apixaban (ELIQUIS) 5 MG TABS tablet Take 5 mg by mouth 2 (two) times daily.    . metoprolol succinate (TOPROL-XL) 25 MG 24 hr tablet Take 25 mg by mouth daily.    . rosuvastatin (CRESTOR) 10 MG tablet TAKE 1 TABLET BY MOUTH EVERY DAY    . zolpidem (AMBIEN CR) 12.5 MG CR tablet Take 1 tablet (12.5 mg total) by mouth at bedtime as needed for sleep. 30 tablet 5   No current facility-administered medications for this visit.     Lab Results: No results found for this or any previous visit (from the past 48 hour(s)).  Blood Alcohol level:  Lab Results  Component Value Date   ETH <11 12/01/2011   ETH 118 (H) 11/03/2011    Physical Findings: AIMS:  , ,  ,  ,    CIWA:    COWS:     Musculoskeletal: Strength & Muscle Tone: within normal limits Gait & Station: normal Patient leans: N/A  Psychiatric Specialty Exam: ROS  Blood pressure 126/74, pulse 64, height 6\' 5"  (1.956 m), weight 213 lb 12.8 oz (97  kg).Body mass index is 25.35 kg/m.  General Appearance: Casual  Eye Contact::  Good  Speech:  Clear and Coherent  Volume:  Normal  Mood:  Euthymic  Affect:  Congruent  Thought Process:  Coherent  Orientation:  Full (Time, Place, and Person)  Thought Content:  WDL  Suicidal Thoughts:  No  Homicidal Thoughts:  No  Memory:  NA  Judgement:  Good  Insight:  Good  Psychomotor Activity:  Normal  Concentration:  Good  Recall:  Good  Fund of Knowledge:Good  Language: Good  Akathisia:  No  Handed:  Right  AIMS (if indicated):     Assets:  Communication Skills  ADL's:  Intact  Cognition: WNL  Sleep:      Treatment Plan Summary: 11/21/2017, 10:17 AM Patient ID: [name], male   DOB: 05-May-1945, 72 y.o.   MRN: [id number] This patient's #1 and only problem seems to be primary insomnia.The use of the Ambien as ordered seems to work well. He does get a reasonably good number of hours and does not affect his next a function. In essence now with the use of Ambien good night's sleep. This is his only problem. He is no issues of depression psychosis or substance abuse. Today we spent 50% of her time giving him education about the benefits of Ambien how it is not dangerous for him however does not cause dementia. We talked a lot about issues and the importance of using it on a persistent regular basis. He seems to understand this well. The patient will return to see me in 5 months for a 15 minute visit.

## 2018-04-29 ENCOUNTER — Ambulatory Visit (HOSPITAL_COMMUNITY): Payer: Self-pay | Admitting: Psychiatry

## 2018-05-12 ENCOUNTER — Ambulatory Visit (HOSPITAL_COMMUNITY): Payer: Medicare Other | Admitting: Psychiatry

## 2018-05-26 ENCOUNTER — Ambulatory Visit (INDEPENDENT_AMBULATORY_CARE_PROVIDER_SITE_OTHER): Payer: Medicare Other | Admitting: Psychiatry

## 2018-05-26 ENCOUNTER — Encounter (HOSPITAL_COMMUNITY): Payer: Self-pay | Admitting: Psychiatry

## 2018-05-26 ENCOUNTER — Other Ambulatory Visit: Payer: Self-pay

## 2018-05-26 VITALS — BP 120/82 | HR 53 | Ht 77.0 in | Wt 213.0 lb

## 2018-05-26 DIAGNOSIS — Z79899 Other long term (current) drug therapy: Secondary | ICD-10-CM | POA: Diagnosis not present

## 2018-05-26 DIAGNOSIS — F17200 Nicotine dependence, unspecified, uncomplicated: Secondary | ICD-10-CM

## 2018-05-26 DIAGNOSIS — F5101 Primary insomnia: Secondary | ICD-10-CM | POA: Diagnosis not present

## 2018-05-26 NOTE — Progress Notes (Signed)
Psychiatric Initial Adult Assessment   Patient Identification: [name] MRN:  [id number] Date of Evaluation:  05/26/2018 Referral Source: primary care Chief Complaint:   Chief Complaint    Establish Care     Visit Diagnosis:    ICD-10-CM   1. Primary insomnia F51.01     History of Present Illness: 73 years old currently living with his ex-wife has seen Dr. Roland Earl in [facility] psychiatry clinic.  Recently seen by primary care physician but he felt uncomfortable feeling with Ambien  Patient diagnosed with insomnia has been on different medication including trazodone and melatonin.  He has suffered from depression anxiety a few years ago when he had gone through separation and job concerns difficult time leading to anxiety has been on medication but his anxiety persisted more so around at nighttime causing insomnia.  He would have difficulty sleeping without Ambien he has been taking Ambien sleeps fairly well more than 6 to 7 hours without Ambien start wearing difficulties to sleep.  He has tried to cut it down but it makes things worse he has been seen Dr. Roland Earl remains in stable condition and has been getting Ambien he understands all the risk and benefits.  He understands his age being above 45 and risk of being on any sedating medication during Ambien and its effects  Denies depression as of now denies excessive worries during the daytime.  He has moved to [place] for which she wanted to transfer the consult clinic  No psychotic episode or manic episode  History of depression 5 6 years ago  Goes to bed around 930 does not take naps during the day takes 2 coffees during the morning time has nicotine less than a pack a day  Drinks a couple beers on the weekends but not on a regular daily basis he has been drinking heavily 5 6 years ago when he was going through separation but has stopped drinking but he has still understands to cut down on his alcohol and its effect along  with medication  He has suffered from coronary artery disease and atrial fibrillation states that he gets anxious and Ambien intermittently helps anxiety and sleep and it keeps his worries about his heart condition more stable  Aggravating factor: CAD, AF  Modifying factor: x wife, meds     Associated Signs/Symptoms: Depression Symptoms:  anxiety, disturbed sleep, (Hypo) Manic Symptoms:  Distractibility, Anxiety Symptoms:  denies Psychotic Symptoms:  denies PTSD Symptoms: NA  Past Psychiatric History: anxeity, insomnia  Previous Psychotropic Medications: Yes   Substance Abuse History in the last 12 months:  Yes.   Some [place] of beers on weekends Consequences of Substance Abuse: fatigue , memory concerns  Past Medical History:  Past Medical History:  Diagnosis Date  . A-fib ([place])   . ALCOHOL ABUSE 01/18/2009  . Anxiety   . Colon cancer ([place])   . Coronary artery disease    s/p CABG  . Depression   . 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 03/21/2011 - 2D Echo: EF 25% to 30%. Diffuse hypokinesis.  . Fatigue   . H/O alcohol abuse   . HEMORRHOIDS 01/17/2009  . Hepatitis    hepatitis B-never treated-Titers were low  . HLD (hyperlipidemia)   . HTN (hypertension)   . Insomnia    TAKES ATIVAN TO SLEEP  . Left shoulder pain   . Persistent atrial fibrillation    diagnosed 2010  . Rhonchi     Past Surgical History:  Procedure Laterality Date  . APPENDECTOMY  2019  . CARDIAC CATHETERIZATION    . COLON SURGERY  07/2012   COLON CANCER  . COLONOSCOPY N/A 02/11/2013   Procedure: COLONOSCOPY;  Surgeon: Milus Banister, MD;  [facility];  Service: Endoscopy;  Laterality: N/A;  . COLONOSCOPY WITH PROPOFOL N/A 05/02/2016   Procedure: COLONOSCOPY WITH PROPOFOL;  Surgeon: Milus Banister, MD;  Location: [facility];  Service: Endoscopy;  Laterality: N/A;  . CORONARY ARTERY BYPASS GRAFT     triple  bypass OCT 2013 in [place] N/A 08/30/2014   Procedure: CARDIOVERSION;  Surgeon: [name], MD;  Location: [facility];  Service: Cardiovascular;  Laterality: N/A;  . ELECTROPHYSIOLOGIC STUDY N/A 09/29/2014   Procedure: CARDIOVERSION;  Surgeon: [name], MD;  Location: [facility];  Service: Cardiovascular;  Laterality: N/A;  . FLEXIBLE SIGMOIDOSCOPY N/A 12/16/2013   Procedure: FLEXIBLE SIGMOIDOSCOPY;  Surgeon: Milus Banister, MD;  [facility]: [facility];  Service: Endoscopy;  Laterality: N/A;  . HEMORRHOID SURGERY N/A 03/09/2013   Procedure: HEMORRHOIDECTOMY;  Surgeon: Pedro Earls, MD;  Location: [facility];  Service: General;  Laterality: N/A;  . MOUTH SURGERY      Family Psychiatric History: denies   Family History:  Family History  Problem Relation Age of Onset  . Heart disease Father   . Asthma Mother   . Dementia Mother   . Colon cancer Sister 110    Social History:   Social History   Socioeconomic History  . Marital status: Divorced    Spouse name: Not on file  . Number of children: 2  . Years of education: 47  . Highest education level: Not on file  Occupational History  . Occupation: Retired[employer]: RETIRED  Social Needs  . Financial resource strain: Not on file  . Food insecurity:    Worry: Not on file    Inability: Not on file  . Transportation needs:    Medical: Not on file    Non-medical: Not on file  Tobacco Use  . Smoking status: Current Every Day Smoker    Packs/day: 0.50    Years: 50.00    Pack years: 25.00    Types: Cigarettes  . Smokeless tobacco: Never Used  . Tobacco comment: tobacco info given 01/07/14  Substance and Sexual Activity  . Alcohol use: Yes    Alcohol/week: 0.0 standard drinks    Comment: 2-4 drinks per month, previously heavy  . Drug use: No  . Sexual activity: Never  Lifestyle  . Physical activity:    Days per week: Not on file    Minutes per session: Not on file  . Stress: Not on file  Relationships  . Social  connections:    Talks on phone: Not on file    Gets together: Not on file    Attends religious service: Not on file    Active member of club or organization: Not on file    Attends meetings of clubs or organizations: Not on file    Relationship status: Not on file  Other Topics Concern  . 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   Has moved homes x 3 in 2 months   States cannot always afford meds-See PI   Drinks 2 cups of coffee a day     Additional  Social History: Grew up with parents normal average childhood.  Currently living with ex-wife and is retired  Allergies:  No Known Allergies  Metabolic Disorder Labs: Lab Results  Component Value Date   HGBA1C 5.5 03/17/2011   MPG 111 03/17/2011   MPG 105 01/03/2011   No results found for: PROLACTIN Lab Results  Component Value Date   CHOL 129 01/04/2011   TRIG 86 01/04/2011   HDL 41 01/04/2011   CHOLHDL 3.1 01/04/2011   VLDL 17 01/04/2011   [place] 71 01/04/2011   Lab Results  Component Value Date   TSH 1.563 03/17/2011    Therapeutic Level Labs: No results found for: LITHIUM No results found for: CBMZ No results found for: VALPROATE  Current Medications: Current Outpatient Medications  Medication Sig Dispense Refill  . amiodarone (PACERONE) 200 MG tablet Take one tablet by mouth twice daily 60 tablet 3  . apixaban (ELIQUIS) 5 MG TABS tablet Take 5 mg by mouth 2 (two) times daily.    . metoprolol succinate (TOPROL-XL) 25 MG 24 hr tablet Take 25 mg by mouth daily.    . rosuvastatin (CRESTOR) 10 MG tablet TAKE 1 TABLET BY MOUTH EVERY DAY    . zolpidem (AMBIEN CR) 12.5 MG CR tablet Take 1 tablet (12.5 mg total) by mouth at bedtime as needed for sleep. 30 tablet 5   No current facility-administered medications for this visit.     Musculoskeletal: Strength & Muscle Tone: within normal limits Gait & Station:  normal Patient leans: no lean  Psychiatric Specialty Exam: Review of Systems  Cardiovascular: Negative for chest pain.  Skin: Negative for rash.  Psychiatric/Behavioral: Negative for depression.    Blood pressure 120/82, pulse (!) 53, height 6\' 5"  (1.956 m), weight 213 lb (96.6 kg).Body mass index is 25.26 kg/m.  General Appearance: Casual  Eye Contact:  Fair  Speech:  Normal Rate  Volume:  Normal  Mood:  Euthymic  Affect:  Congruent  Thought Process:  Goal Directed  Orientation:  Full (Time, Place, and Person)  Thought Content:  Logical  Suicidal Thoughts:  No  Homicidal Thoughts:  No  Memory:  Immediate;   Fair Recent;   Fair  Judgement:  Fair  Insight:  Fair  Psychomotor Activity:  Normal  Concentration:  Concentration: Fair and Attention Span: Fair  Recall:  [employer] of Knowledge:Fair  Language: Fair  Akathisia:  No  Handed:  Right  AIMS (if indicated):  not done  Assets:  Desire for Improvement  ADL's:  Intact  Cognition: WNL  Sleep:  Fair   Screenings: PHQ2-9     Office Visit from 09/30/2012 in [place] Office Visit from 09/01/2012 in [place]  PHQ-2 Total Score  0  0      Assessment and Plan: insomnia primary: Doing fair on Ambien 12.5 mg CR he has medication for the next 3 weeks he will bring the pill bottle  Discussed in detail about the risk and benefits and considering his age leading to more risk of side effects.  He feels comfortable with the current dose and wants to continue understands all the risk.  He keeps himself active.  We talked about sleep hygiene cutting down on coffee intake cutting down the nicotine intake and cutting down or stopping alcohol  More than 50% time spent in counseling coordination of care including patient education reviewed side effects and concerns were addressed  Follow-up in 3 to 4 weeks  or earlier if needed   Merian Capron, MD 3/17/202011:12 AM

## 2018-06-18 ENCOUNTER — Ambulatory Visit (INDEPENDENT_AMBULATORY_CARE_PROVIDER_SITE_OTHER): Payer: Medicare Other | Admitting: Psychiatry

## 2018-06-18 DIAGNOSIS — F5101 Primary insomnia: Secondary | ICD-10-CM

## 2018-06-18 MED ORDER — ZOLPIDEM TARTRATE ER 12.5 MG PO TBCR: 12.5000 mg | EXTENDED_RELEASE_TABLET | Freq: Every evening | ORAL | 2 refills | Status: DC | PRN

## 2018-06-18 NOTE — Progress Notes (Signed)
[facility] Outpatient visit   Via Tele psych Patient Identification: [name] MRN:  [id number] Date of Evaluation:  06/18/2018 Referral Source: primary care Chief Complaint:    Visit Diagnosis:    ICD-10-CM   1. Primary insomnia F51.01     History of Present Illness: 73 years old currently living with his ex-wife has seen Dr. Roland Earl in [facility] psychiatry clinic. Referred by primary care for insomnia  I connected with [name] on 06/18/18 at 11:00 AM EDT by telephone and verified that I am speaking with the correct person using two identifiers.   I discussed the limitations, risks, security and privacy concerns of performing an evaluation and management service by telephone and the availability of in person appointments. I also discussed with the patient that there may be a patient responsible charge related to this service. The patient expressed understanding and agreed to proceed.  Patient diagnosed with insomnia has been on different medication including trazodone and melatonin.  He has suffered from depression anxiety a few years ago  he has been seen Dr. Roland Earl remains in stable condition and has been getting Ambien he understands all the risk and benefits.  He understands his age being above 61 and risk of being on any sedating medication during Ambien and its effects  Doing fair on ambien, it helps sleep, rest and feels comfortable with anxiety   Denies depression   History of depression 5 6 years ago  Goes to bed around 930 does not take naps during the day takes 2 coffees during the morning time has nicotine less than a pack a day   Aggravating factor:CAD, AF  Modifying factor: w wife , meds     Associated Signs/Symptoms: Depression Symptoms:  anxiety, disturbed sleep, (Hypo) Manic Symptoms:  Distractibility, Anxiety Symptoms:  denies Psychotic Symptoms:  denies PTSD Symptoms: NA  Past Psychiatric History: anxeity, insomnia  Previous Psychotropic  Medications: Yes   Substance Abuse History in the last 12 months:  Yes.   Some [place] of beers on weekends Consequences of Substance Abuse: fatigue , memory concerns  Past Medical History:  Past Medical History:  Diagnosis Date  . A-fib ([place])   . ALCOHOL ABUSE 01/18/2009  . Anxiety   . Colon cancer ([place])   . Coronary artery disease    s/p CABG  . Depression   . 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 03/21/2011 - 2D Echo: EF 25% to 30%. Diffuse hypokinesis.  . Fatigue   . H/O alcohol abuse   . HEMORRHOIDS 01/17/2009  . Hepatitis    hepatitis B-never treated-Titers were low  . HLD (hyperlipidemia)   . HTN (hypertension)   . Insomnia    TAKES ATIVAN TO SLEEP  . Left shoulder pain   . Persistent atrial fibrillation    diagnosed 2010  . Rhonchi     Past Surgical History:  Procedure Laterality Date  . APPENDECTOMY  2019  . CARDIAC CATHETERIZATION    . COLON SURGERY  07/2012   COLON CANCER  . COLONOSCOPY N/A 02/11/2013   Procedure: COLONOSCOPY;  Surgeon: Milus Banister, MD;  Location: [facility];  Service: Endoscopy;  Laterality: N/A;  . COLONOSCOPY WITH PROPOFOL N/A 05/02/2016   Procedure: COLONOSCOPY WITH PROPOFOL;  Surgeon: Milus Banister, MD;  Location: [facility];  Service: Endoscopy;  Laterality: N/A;  . CORONARY ARTERY BYPASS GRAFT     triple  bypass OCT 2013 in [place] N/A 08/30/2014  Procedure: CARDIOVERSION;  Surgeon: [name], MD;  Location: [facility];  Service: Cardiovascular;  Laterality: N/A;  . ELECTROPHYSIOLOGIC STUDY N/A 09/29/2014   Procedure: CARDIOVERSION;  Surgeon: [name], MD;  Location: [place] [facility];  Service: Cardiovascular;  Laterality: N/A;  . FLEXIBLE SIGMOIDOSCOPY N/A 12/16/2013   Procedure: FLEXIBLE SIGMOIDOSCOPY;  Surgeon: Milus Banister, MD;  [facility];  Service: Endoscopy;  Laterality: N/A;  . HEMORRHOID SURGERY N/A 03/09/2013   Procedure: HEMORRHOIDECTOMY;  Surgeon:  Pedro Earls, MD;  Location: [facility];  Service: General;  Laterality: N/A;  . MOUTH SURGERY      Family Psychiatric History: denies   Family History:  Family History  Problem Relation Age of Onset  . Heart disease Father   . Asthma Mother   . Dementia Mother   . Colon cancer Sister 68    Social History:   Social History   Socioeconomic History  . Marital status: Divorced    Spouse name: Not on file  . Number of children: 2  . Years of education: 71  . Highest education level: Not on file  Occupational History  . Occupation: [employer]: RETIRED  Social Needs  . Financial resource strain: Not on file  . Food insecurity:    Worry: Not on file    Inability: Not on file  . Transportation needs:    Medical: Not on file    Non-medical: Not on file  Tobacco Use  . Smoking status: Current Every Day Smoker    Packs/day: 0.50    Years: 50.00    Pack years: 25.00    Types: Cigarettes  . Smokeless tobacco: Never Used  . Tobacco comment: tobacco info given 01/07/14  Substance and Sexual Activity  . Alcohol use: Yes    Alcohol/week: 0.0 standard drinks    Comment: 2-4 drinks per month, previously heavy  . Drug use: No  . Sexual activity: Never  Lifestyle  . Physical activity:    Days per week: Not on file    Minutes per session: Not on file  . Stress: Not on file  Relationships  . Social connections:    Talks on phone: Not on file    Gets together: Not on file    Attends religious service: Not on file    Active member of club or organization: Not on file    Attends meetings of clubs or organizations: Not on file    Relationship status: Not on file  Other Topics Concern  . 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   Has moved homes x 3 in 2 months   States cannot always afford meds-See PI   Drinks 2 cups of coffee a day     Additional Social  History: Grew up with parents normal average childhood.  Currently living with ex-wife and is retired  Allergies:  No Known Allergies  Metabolic Disorder Labs: Lab Results  Component Value Date   HGBA1C 5.5 03/17/2011   MPG 111 03/17/2011   MPG 105 01/03/2011   No results found for: PROLACTIN Lab Results  Component Value Date   CHOL 129 01/04/2011   TRIG 86 01/04/2011   HDL 41 01/04/2011   CHOLHDL 3.1 01/04/2011   VLDL 17 01/04/2011   LDLCALC 71 01/04/2011   Lab Results  Component Value Date   TSH 1.563 03/17/2011  Therapeutic Level Labs: No results found for: LITHIUM No results found for: CBMZ No results found for: VALPROATE  Current Medications: Current Outpatient Medications  Medication Sig Dispense Refill  . amiodarone (PACERONE) 200 MG tablet Take one tablet by mouth twice daily 60 tablet 3  . apixaban (ELIQUIS) 5 MG TABS tablet Take 5 mg by mouth 2 (two) times daily.    . metoprolol succinate (TOPROL-XL) 25 MG 24 hr tablet Take 25 mg by mouth daily.    . rosuvastatin (CRESTOR) 10 MG tablet TAKE 1 TABLET BY MOUTH EVERY DAY    . zolpidem (AMBIEN CR) 12.5 MG CR tablet Take 1 tablet (12.5 mg total) by mouth at bedtime as needed for sleep. 30 tablet 2   No current facility-administered medications for this visit.       Psychiatric Specialty Exam: Review of Systems  Cardiovascular: Negative for chest pain.  Skin: Negative for rash.  Psychiatric/Behavioral: Negative for depression.    There were no vitals taken for this visit.There is no height or weight on file to calculate BMI.  General Appearance:  Eye Contact:   Speech:  Normal Rate  Volume:  Normal  Mood: fair  Affect:  Congruent  Thought Process:  Goal Directed  Orientation:  Full (Time, Place, and Person)  Thought Content:  Logical  Suicidal Thoughts:  No  Homicidal Thoughts:  No  Memory:  Immediate;   Fair Recent;   Fair  Judgement:  Fair  Insight:  Fair  Psychomotor Activity:  Normal   Concentration:  Concentration: Fair and Attention Span: Fair  Recall:  [employer] of Knowledge:Fair  Language: Fair  Akathisia:  No  Handed:  Right  AIMS (if indicated):  not done  Assets:  Desire for Improvement  ADL's:  Intact  Cognition: WNL  Sleep:  Fair   Screenings: PHQ2-9     Office Visit from 09/30/2012 in [place] Office Visit from 09/01/2012 in [place]  PHQ-2 Total Score  0  0      Assessment and Plan: insomnia primary: doing fair on ambien, understands the risk but wants to continue as it helps his anxiety, sleep and he does not want to have any anxiety effecting his heart condition  Refills sent  discussed the assessment and treatment plan with the patient. The patient was provided an opportunity to ask questions and all were answered. The patient agreed with the plan and demonstrated an understanding of the instructions.   The patient was advised to call back or seek an in-person evaluation if the symptoms worsen or if the condition fails to improve as anticipated.  I provided 15 minutes of non-face-to-face time during this encounter. Fu 18m.    Merian Capron, MD 4/9/20209:29 AM

## 2018-06-19 ENCOUNTER — Ambulatory Visit (HOSPITAL_COMMUNITY): Payer: Medicare Other | Admitting: Psychiatry

## 2018-09-13 ENCOUNTER — Other Ambulatory Visit (HOSPITAL_COMMUNITY): Payer: Self-pay | Admitting: Psychiatry

## 2018-09-15 NOTE — Telephone Encounter (Signed)
Patient needs refill on Ambien sent to [facility] on [address] in [place]

## 2018-09-16 ENCOUNTER — Other Ambulatory Visit (HOSPITAL_COMMUNITY): Payer: Self-pay | Admitting: Psychiatry

## 2018-09-16 MED ORDER — ZOLPIDEM TARTRATE ER 12.5 MG PO TBCR: 12.5000 mg | EXTENDED_RELEASE_TABLET | Freq: Every evening | ORAL | 0 refills | Status: DC | PRN

## 2018-09-16 NOTE — Telephone Encounter (Signed)
Informed patient of med sent to the pharmacy

## 2018-09-16 NOTE — Telephone Encounter (Signed)
Pt calling because ambien rx was not sent to pharmacy yesterday.  [facility] [address].   [phone]

## 2018-09-16 NOTE — Telephone Encounter (Signed)
sent 

## 2018-09-21 ENCOUNTER — Ambulatory Visit (HOSPITAL_COMMUNITY): Payer: Medicare Other | Admitting: Psychiatry

## 2018-09-28 ENCOUNTER — Encounter (HOSPITAL_COMMUNITY): Payer: Self-pay | Admitting: Psychiatry

## 2018-09-28 ENCOUNTER — Ambulatory Visit (INDEPENDENT_AMBULATORY_CARE_PROVIDER_SITE_OTHER): Payer: Medicare Other | Admitting: Psychiatry

## 2018-09-28 ENCOUNTER — Other Ambulatory Visit: Payer: Self-pay

## 2018-09-28 DIAGNOSIS — F5101 Primary insomnia: Secondary | ICD-10-CM | POA: Diagnosis not present

## 2018-09-28 MED ORDER — ZOLPIDEM TARTRATE ER 12.5 MG PO TBCR: 12.5000 mg | EXTENDED_RELEASE_TABLET | Freq: Every evening | ORAL | 0 refills | Status: DC | PRN

## 2018-09-28 NOTE — Progress Notes (Signed)
[facility] Outpatient visit   Via Tele psych Patient Identification: [name] MRN:  [id number] Date of Evaluation:  09/28/2018 Referral Source: primary care Chief Complaint:    Visit Diagnosis:    ICD-10-CM   1. Primary insomnia  F51.01     History of Present Illness: 73 years old currently living with his ex-wife has seen Dr. Roland Earl in [facility] psychiatry clinic. Referred by primary care for insomnia   I connected with [name] on 09/28/18 at  2:30 PM EDT by telephone and verified that I am speaking with the correct person using two identifiers.   I discussed the limitations, risks, security and privacy concerns of performing an evaluation and management service by telephone and the availability of in person appointments. I also discussed with the patient that there may be a patient responsible charge related to this service. The patient expressed understanding and agreed to proceed.  [name] helps otherwise poor sleep and its effect, has been on different meds before  Denies depression   History of depression 5 - 6 years ago  Goes to bed around 930 does not take naps during the day takes 2 coffees during the morning time has nicotine less than a pack a day   Aggravating factor:CAD, AF Modifying factor: w wife , meds     Associated Signs/Symptoms: Depression Symptoms:  anxiety, disturbed sleep, (Hypo) Manic Symptoms:  Distractibility, Anxiety Symptoms:  denies Psychotic Symptoms:  denies PTSD Symptoms: NA  Past Psychiatric History: anxeity, insomnia  Previous Psychotropic Medications: Yes   Substance Abuse History in the last 12 months:  Yes.   Some [place] of beers on weekends Consequences of Substance Abuse: fatigue , memory concerns  Past Medical History:  Past Medical History:  Diagnosis Date  . A-fib ([place])   . ALCOHOL ABUSE 01/18/2009  . Anxiety   . Colon cancer ([place])   . Coronary artery disease    s/p CABG  . Depression   . Dilated  cardiomyopathy secondary to alcohol ([place])    A. 03/21/2011 - 2D Echo: EF 25% to 30%. Diffuse hypokinesis.  . Fatigue   . H/O alcohol abuse   . HEMORRHOIDS 01/17/2009  . Hepatitis    hepatitis B-never treated-Titers were low  . HLD (hyperlipidemia)   . HTN (hypertension)   . Insomnia    TAKES ATIVAN TO SLEEP  . Left shoulder pain   . Persistent atrial fibrillation    diagnosed 2010  . Rhonchi     Past Surgical History:  Procedure Laterality Date  . APPENDECTOMY  2019  . CARDIAC CATHETERIZATION    . COLON SURGERY  07/2012   COLON CANCER  . COLONOSCOPY N/A 02/11/2013   Procedure: COLONOSCOPY;  Surgeon: Milus Banister, MD;  [facility];  Service: Endoscopy;  Laterality: N/A;  . COLONOSCOPY WITH PROPOFOL N/A 05/02/2016   Procedure: COLONOSCOPY WITH PROPOFOL;  Surgeon: Milus Banister, MD;  [facility];  Service: Endoscopy;  Laterality: N/A;  . CORONARY ARTERY BYPASS GRAFT     triple  bypass OCT 2013 in [place] N/A 08/30/2014   Procedure: CARDIOVERSION;  Surgeon: [name], MD;  Location: [facility];  Service: Cardiovascular;  Laterality: N/A;  . ELECTROPHYSIOLOGIC STUDY N/A 09/29/2014   Procedure: CARDIOVERSION;  Surgeon: [name], MD;  Location: [place] [facility];  Service: Cardiovascular;  Laterality: N/A;  . FLEXIBLE SIGMOIDOSCOPY N/A 12/16/2013   Procedure: FLEXIBLE SIGMOIDOSCOPY;  Surgeon: Milus Banister, MD;  [facility];  Service: Endoscopy;  Laterality: N/A;  . HEMORRHOID SURGERY N/A 03/09/2013   Procedure: HEMORRHOIDECTOMY;  Surgeon: Pedro Earls, MD;  [facility];  Service: General;  Laterality: N/A;  . MOUTH SURGERY      Family Psychiatric History: denies   Family History:  Family History  Problem Relation Age of Onset  . Heart disease Father   . Asthma Mother   . Dementia Mother   . Colon cancer Sister 29    Social History:   Social History   Socioeconomic History  . Marital status:  Divorced    Spouse name: Not on file  . Number of children: 2  . Years of education: 44  . Highest education level: Not on file  Occupational History  . Occupation: [employer]: RETIRED  Social Needs  . Financial resource strain: Not on file  . Food insecurity    Worry: Not on file    Inability: Not on file  . Transportation needs    Medical: Not on file    Non-medical: Not on file  Tobacco Use  . Smoking status: Current Every Day Smoker    Packs/day: 0.50    Years: 50.00    Pack years: 25.00    Types: Cigarettes  . Smokeless tobacco: Never Used  . Tobacco comment: tobacco info given 01/07/14  Substance and Sexual Activity  . Alcohol use: Yes    Alcohol/week: 0.0 standard drinks    Comment: 2-4 drinks per month, previously heavy  . Drug use: No  . Sexual activity: Never  Lifestyle  . Physical activity    Days per week: Not on file    Minutes per session: Not on file  . Stress: Not on file  Relationships  . Social Herbalist on phone: Not on file    Gets together: Not on file    Attends religious service: Not on file    Active member of club or organization: Not on file    Attends meetings of clubs or organizations: Not on file    Relationship status: Not on file  Other Topics Concern  . 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   Has moved homes x 3 in 2 months   States cannot always afford meds-See PI   Drinks 2 cups of coffee a day     Additional Social History: Grew up with parents normal average childhood.  Currently living with ex-wife and is retired  Allergies:  No Known Allergies  Metabolic Disorder Labs: Lab Results  Component Value Date   HGBA1C 5.5 03/17/2011   MPG 111 03/17/2011   MPG 105 01/03/2011   No results found for: PROLACTIN Lab Results  Component Value Date   CHOL 129 01/04/2011   TRIG 86 01/04/2011   HDL  41 01/04/2011   CHOLHDL 3.1 01/04/2011   VLDL 17 01/04/2011   [place] 71 01/04/2011   Lab Results  Component Value Date   TSH 1.563 03/17/2011    Therapeutic Level Labs: No results found for: LITHIUM No results found for: CBMZ No results found for: VALPROATE  Current Medications: Current Outpatient Medications  Medication Sig Dispense Refill  . amiodarone (PACERONE) 200 MG tablet Take one tablet by mouth twice daily 60 tablet 3  . apixaban (ELIQUIS) 5 MG TABS tablet Take 5 mg by mouth 2 (two) times daily.    Marland Kitchen  metoprolol succinate (TOPROL-XL) 25 MG 24 hr tablet Take 25 mg by mouth daily.    . rosuvastatin (CRESTOR) 10 MG tablet TAKE 1 TABLET BY MOUTH EVERY DAY    . zolpidem (AMBIEN CR) 12.5 MG CR tablet Take 1 tablet (12.5 mg total) by mouth at bedtime as needed for sleep. Refill when due 30 tablet 0   No current facility-administered medications for this visit.       Psychiatric Specialty Exam: Review of Systems  Cardiovascular: Negative for chest pain.  Skin: Negative for rash.  Psychiatric/Behavioral: Negative for depression.    There were no vitals taken for this visit.There is no height or weight on file to calculate BMI.  General Appearance:  Eye Contact:   Speech:  Normal Rate  Volume:  Normal  Mood:fair  Affect:  Congruent  Thought Process:  Goal Directed  Orientation:  Full (Time, Place, and Person)  Thought Content:  Logical  Suicidal Thoughts:  No  Homicidal Thoughts:  No  Memory:  Immediate;   Fair Recent;   Fair  Judgement:  Fair  Insight:  Fair  Psychomotor Activity:  Normal  Concentration:  Concentration: Fair and Attention Span: Fair  Recall:  [employer] of Knowledge:Fair  Language: Fair  Akathisia:  No  Handed:  Right  AIMS (if indicated):  not done  Assets:  Desire for Improvement  ADL's:  Intact  Cognition: WNL  Sleep:  Fair   Screenings: PHQ2-9     Office Visit from 09/30/2012 in [place] Office  Visit from 09/01/2012 in [place]  PHQ-2 Total Score  0  0      Assessment and Plan: insomnia primary: doing fair on ambien, will conitnue considering heart risk and condition. Need restful sleep Refills sent  I discussed the assessment and treatment plan with the patient. The patient was provided an opportunity to ask questions and all were answered. The patient agreed with the plan and demonstrated an understanding of the instructions.   The patient was advised to call back or seek an in-person evaluation if the symptoms worsen or if the condition fails to improve as anticipated.  I provided 15 minutes of non-face-to-face time during this encounter. Fu 40m.    Merian Capron, MD 7/20/20202:38 PM

## 2018-11-13 ENCOUNTER — Telehealth (HOSPITAL_COMMUNITY): Payer: Self-pay | Admitting: Psychiatry

## 2018-11-13 MED ORDER — ZOLPIDEM TARTRATE ER 12.5 MG PO TBCR: 12.5000 mg | EXTENDED_RELEASE_TABLET | Freq: Every evening | ORAL | 0 refills | Status: DC | PRN

## 2018-11-13 NOTE — Telephone Encounter (Signed)
Refill sent.

## 2018-11-13 NOTE — Telephone Encounter (Signed)
pt needs refill on ambien 

## 2018-11-13 NOTE — Telephone Encounter (Signed)
Informed pt refill sent. Nothing further needed at this time.

## 2018-12-07 ENCOUNTER — Other Ambulatory Visit (HOSPITAL_COMMUNITY): Payer: Self-pay

## 2018-12-07 MED ORDER — ZOLPIDEM TARTRATE ER 12.5 MG PO TBCR: 12.5000 mg | EXTENDED_RELEASE_TABLET | Freq: Every evening | ORAL | 0 refills | Status: DC | PRN

## 2018-12-29 ENCOUNTER — Ambulatory Visit (INDEPENDENT_AMBULATORY_CARE_PROVIDER_SITE_OTHER): Payer: Medicare Other | Admitting: Psychiatry

## 2018-12-29 ENCOUNTER — Encounter (HOSPITAL_COMMUNITY): Payer: Self-pay | Admitting: Psychiatry

## 2018-12-29 DIAGNOSIS — F5101 Primary insomnia: Secondary | ICD-10-CM

## 2018-12-29 MED ORDER — ZOLPIDEM TARTRATE ER 12.5 MG PO TBCR: 12.5000 mg | EXTENDED_RELEASE_TABLET | Freq: Every evening | ORAL | 3 refills | Status: DC | PRN

## 2018-12-29 NOTE — Progress Notes (Signed)
[facility] Outpatient visit   Via Tele psych Patient Identification: [name] MRN:  [id number] Date of Evaluation:  12/29/2018 Referral Source: primary care Chief Complaint:   med review Visit Diagnosis:    ICD-10-CM   1. Primary insomnia  F51.01     History of Present Illness: 73 years old currently living with his ex-wife has seen Dr. Roland Earl in [facility] psychiatry clinic. Referred by primary care for insomnia    I connected with [name] on 12/29/18 at  3:00 PM EDT by telephone and verified that I am speaking with the correct person using two identifiers.   I discussed the limitations, risks, security and privacy concerns of performing an evaluation and management service by telephone and the availability of in person appointments. I also discussed with the patient that there may be a patient responsible charge related to this service. The patient expressed understanding and agreed to proceed.  Doing fair on ambien, sleeps well otherwise has insomnia Has tried other meds   History of depression 5 - 6 years ago  Goes to bed around 930 does not take naps during the day takes 2 coffees during the morning time  Aggravating factor:CAD, AF Modifying factor: w wife , meds      Past Psychiatric History: anxeity, insomnia  Previous Psychotropic Medications: Yes   Substance Abuse History in the last 12 months:  Yes.   Some [place] of beers on weekends  Past Medical History:  Past Medical History:  Diagnosis Date  . A-fib ([place])   . ALCOHOL ABUSE 01/18/2009  . Anxiety   . Colon cancer ([place])   . Coronary artery disease    s/p CABG  . Depression   . 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 03/21/2011 - 2D Echo: EF 25% to 30%. Diffuse hypokinesis.  . Fatigue   . H/O alcohol abuse   . HEMORRHOIDS 01/17/2009  . Hepatitis    hepatitis B-never treated-Titers were low  . HLD (hyperlipidemia)   . HTN (hypertension)   . Insomnia    TAKES ATIVAN TO SLEEP  .  Left shoulder pain   . Persistent atrial fibrillation ([place])    diagnosed 2010  . Rhonchi     Past Surgical History:  Procedure Laterality Date  . APPENDECTOMY  2019  . CARDIAC CATHETERIZATION    . COLON SURGERY  07/2012   COLON CANCER  . COLONOSCOPY N/A 02/11/2013   Procedure: COLONOSCOPY;  Surgeon: Milus Banister, MD;  [facility];  Service: Endoscopy;  Laterality: N/A;  . COLONOSCOPY WITH PROPOFOL N/A 05/02/2016   Procedure: COLONOSCOPY WITH PROPOFOL;  Surgeon: Milus Banister, MD;  [facility];  Service: Endoscopy;  Laterality: N/A;  . CORONARY ARTERY BYPASS GRAFT     triple  bypass OCT 2013 in [place] N/A 08/30/2014   Procedure: CARDIOVERSION;  Surgeon: [name], MD;  Location: [facility];  Service: Cardiovascular;  Laterality: N/A;  . ELECTROPHYSIOLOGIC STUDY N/A 09/29/2014   Procedure: CARDIOVERSION;  Surgeon: [name], MD;  Location: [facility];  Service: Cardiovascular;  Laterality: N/A;  . FLEXIBLE SIGMOIDOSCOPY N/A 12/16/2013   Procedure: FLEXIBLE SIGMOIDOSCOPY;  Surgeon: Milus Banister, MD;  [facility];  Service: Endoscopy;  Laterality: N/A;  . HEMORRHOID SURGERY N/A 03/09/2013   Procedure: HEMORRHOIDECTOMY;  Surgeon: Pedro Earls, MD;  Location: [facility];  Service: General;  Laterality: N/A;  . [place]  History: denies   Family History:  Family History  Problem Relation Age of Onset  . Heart disease Father   . Asthma Mother   . Dementia Mother   . Colon cancer Sister 73    Social History:   Social History   Socioeconomic History  . Marital status: Divorced    Spouse name: Not on file  . Number of children: 2  . Years of education: 23  . Highest education level: Not on file  Occupational History  . Occupation: RetiredLand: RETIRED  Social Needs  . Financial resource strain: Not on file  . Food insecurity    Worry: Not on  file    Inability: Not on file  . Transportation needs    Medical: Not on file    Non-medical: Not on file  Tobacco Use  . Smoking status: Current Every Day Smoker    Packs/day: 0.50    Years: 50.00    Pack years: 25.00    Types: Cigarettes  . Smokeless tobacco: Never Used  . Tobacco comment: tobacco info given 01/07/14  Substance and Sexual Activity  . Alcohol use: Yes    Alcohol/week: 0.0 standard drinks    Comment: 2-4 drinks per month, previously heavy  . Drug use: No  . Sexual activity: Never  Lifestyle  . Physical activity    Days per week: Not on file    Minutes per session: Not on file  . Stress: Not on file  Relationships  . Social Herbalist on phone: Not on file    Gets together: Not on file    Attends religious service: Not on file    Active member of club or organization: Not on file    Attends meetings of clubs or organizations: Not on file    Relationship status: Not on file  Other Topics Concern  . 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   Has moved homes x 3 in 2 months   States cannot always afford meds-See PI   Drinks 2 cups of coffee a day     Additional Social History: Grew up with parents normal average childhood.  Currently living with ex-wife and is retired  Allergies:  No Known Allergies  Metabolic Disorder Labs: Lab Results  Component Value Date   HGBA1C 5.5 03/17/2011   MPG 111 03/17/2011   MPG 105 01/03/2011   No results found for: PROLACTIN Lab Results  Component Value Date   CHOL 129 01/04/2011   TRIG 86 01/04/2011   HDL 41 01/04/2011   CHOLHDL 3.1 01/04/2011   VLDL 17 01/04/2011   Ideal 71 01/04/2011   Lab Results  Component Value Date   TSH 1.563 03/17/2011    Therapeutic Level Labs: No results found for: LITHIUM No results found for: CBMZ No results found for: VALPROATE  Current Medications: Current  Outpatient Medications  Medication Sig Dispense Refill  . amiodarone (PACERONE) 200 MG tablet Take one tablet by mouth twice daily 60 tablet 3  . apixaban (ELIQUIS) 5 MG TABS tablet Take 5 mg by mouth 2 (two) times daily.    . metoprolol succinate (TOPROL-XL) 25 MG 24 hr tablet Take 25 mg by mouth daily.    . rosuvastatin (CRESTOR) 10 MG tablet TAKE 1 TABLET BY MOUTH EVERY DAY    . zolpidem (AMBIEN CR) 12.5  MG CR tablet Take 1 tablet (12.5 mg total) by mouth at bedtime as needed for sleep. Refill when due 30 tablet 3   No current facility-administered medications for this visit.       Psychiatric Specialty Exam: Review of Systems  Cardiovascular: Negative for chest pain.  Skin: Negative for rash.  Psychiatric/Behavioral: Negative for depression.    There were no vitals taken for this visit.There is no height or weight on file to calculate BMI.  General Appearance:  Eye Contact:   Speech:  Normal Rate  Volume:  Normal  Mood:fair  Affect:  Congruent  Thought Process:  Goal Directed  Orientation:  Full (Time, Place, and Person)  Thought Content:  Logical  Suicidal Thoughts:  No  Homicidal Thoughts:  No  Memory:  Immediate;   Fair Recent;   Fair  Judgement:  Fair  Insight:  Fair  Psychomotor Activity:  Normal  Concentration:  Concentration: Fair and Attention Span: Fair  Recall:  [employer] of Knowledge:Fair  Language: Fair  Akathisia:  No  Handed:  Right  AIMS (if indicated):  not done  Assets:  Desire for Improvement  ADL's:  Intact  Cognition: WNL  Sleep:  Fair   Screenings: PHQ2-9     Office Visit from 09/30/2012 in [place] Office Visit from 09/01/2012 in [place]  PHQ-2 Total Score  0  0      Assessment and Plan: insomnia primary: doing fair, reviewed sleep hygiene, continue ambien   I discussed the assessment and treatment plan with the patient. The patient was provided an opportunity to ask  questions and all were answered. The patient agreed with the plan and demonstrated an understanding of the instructions.   The patient was advised to call back or seek an in-person evaluation if the symptoms worsen or if the condition fails to improve as anticipated.  I provided 15 minutes of non-face-to-face time during this encounter. Fu 3-3m   Merian Capron, MD 10/20/20203:02 PM

## 2019-01-06 ENCOUNTER — Telehealth (HOSPITAL_COMMUNITY): Payer: Self-pay | Admitting: Psychiatry

## 2019-01-06 MED ORDER — ZOLPIDEM TARTRATE ER 12.5 MG PO TBCR: 12.5000 mg | EXTENDED_RELEASE_TABLET | Freq: Every evening | ORAL | 3 refills | Status: DC | PRN

## 2019-01-06 NOTE — Telephone Encounter (Signed)
Ok sent!

## 2019-01-06 NOTE — Telephone Encounter (Signed)
Pt calling because he needs his ambien refilled.  The last rx says "Phone in" and [facility] was not [facility].  Please send rx

## 2019-04-27 ENCOUNTER — Other Ambulatory Visit (HOSPITAL_COMMUNITY): Payer: Self-pay | Admitting: Psychiatry

## 2019-04-27 ENCOUNTER — Telehealth (HOSPITAL_COMMUNITY): Payer: Self-pay

## 2019-04-27 NOTE — Telephone Encounter (Signed)
Pharmacy states they do not have anymore rx for Ambien on file. Patient just called stating he only has 2 pills left. [facility] [address] [facility] [place]

## 2019-04-27 NOTE — Telephone Encounter (Signed)
Appt made, nothing further is needed at this time

## 2019-04-27 NOTE — Telephone Encounter (Signed)
[place] I sent but make appointment within a month or before next refill

## 2019-05-07 ENCOUNTER — Ambulatory Visit (INDEPENDENT_AMBULATORY_CARE_PROVIDER_SITE_OTHER): Payer: Medicare Other | Admitting: Psychiatry

## 2019-05-07 ENCOUNTER — Encounter (HOSPITAL_COMMUNITY): Payer: Self-pay | Admitting: Psychiatry

## 2019-05-07 ENCOUNTER — Other Ambulatory Visit: Payer: Self-pay

## 2019-05-07 DIAGNOSIS — F5101 Primary insomnia: Secondary | ICD-10-CM

## 2019-05-07 MED ORDER — ZOLPIDEM TARTRATE ER 12.5 MG PO TBCR: 12.5000 mg | EXTENDED_RELEASE_TABLET | Freq: Every evening | ORAL | 0 refills | Status: DC | PRN

## 2019-05-07 NOTE — Progress Notes (Signed)
[facility] Outpatient visit   Via Tele psych Patient Identification: [name] MRN:  [id number] Date of Evaluation:  05/07/2019 Referral Source: primary care Chief Complaint:   med review. insomnia Visit Diagnosis:    ICD-10-CM   1. 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 Roland Earl in [facility] psychiatry clinic. Referred by primary care for insomnia    I connected with [name] on 05/07/19 at  9:30 AM EST by telephone and verified that I am speaking with the correct person using two identifiers.  I discussed the limitations, risks, security and privacy concerns of performing an evaluation and management service by telephone and the availability of in person appointments. I also discussed with the patient that there may be a patient responsible charge related to this service. The patient expressed understanding and agreed to proceed.  Doing fair on ambien Wants to continue as cannot sleep withou Recent knee surgery some stress   History of depression 5 - 6 years ago  Goes to bed around 930 does not take naps during the day takes 2 coffees during the morning time  Aggravating factor:CAD, AF Modifying factor: x wife , meds      Past Psychiatric History: anxeity, insomnia  Previous Psychotropic Medications: Yes   Substance Abuse History in the last 12 months:  Yes.   Some [place] of beers on weekends  Past Medical History:  Past Medical History:  Diagnosis Date  . A-fib ([place])   . ALCOHOL ABUSE 01/18/2009  . Anxiety   . Colon cancer ([place])   . Coronary artery disease    s/p CABG  . Depression   . 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 03/21/2011 - 2D Echo: EF 25% to 30%. Diffuse hypokinesis.  . Fatigue   . H/O alcohol abuse   . HEMORRHOIDS 01/17/2009  . Hepatitis    hepatitis B-never treated-Titers were low  . HLD (hyperlipidemia)   . HTN (hypertension)   . Insomnia    TAKES ATIVAN TO SLEEP  . Left shoulder pain   . Persistent atrial fibrillation ([place])    diagnosed 2010  . Rhonchi     Past Surgical History:  Procedure Laterality Date  . APPENDECTOMY  2019  . CARDIAC CATHETERIZATION    . COLON SURGERY  07/2012   COLON CANCER  . COLONOSCOPY N/A 02/11/2013   Procedure: COLONOSCOPY;  Surgeon: Milus Banister, MD;  [facility];  Service: Endoscopy;  Laterality: N/A;  . COLONOSCOPY WITH PROPOFOL N/A 05/02/2016   Procedure: COLONOSCOPY WITH PROPOFOL;  Surgeon: Milus Banister, MD;  [facility];  Service: Endoscopy;  Laterality: N/A;  . CORONARY ARTERY BYPASS GRAFT     triple  bypass OCT 2013 in [place] N/A 08/30/2014   Procedure: CARDIOVERSION;  Surgeon: [name], MD;  Location: [facility];  Service: Cardiovascular;  Laterality: N/A;  . ELECTROPHYSIOLOGIC STUDY N/A 09/29/2014   Procedure: CARDIOVERSION;  Surgeon: [name], MD;  Location: [facility];  Service: Cardiovascular;  Laterality: N/A;  . FLEXIBLE SIGMOIDOSCOPY N/A 12/16/2013   Procedure: FLEXIBLE SIGMOIDOSCOPY;  Surgeon: Milus Banister, MD;  [facility];  Service: Endoscopy;  Laterality: N/A;  . HEMORRHOID SURGERY N/A 03/09/2013   Procedure: HEMORRHOIDECTOMY;  Surgeon: Pedro Earls, MD;  [facility];  Service: General;  Laterality: N/A;  . MOUTH SURGERY  Family Psychiatric History: denies   Family History:  Family History  Problem Relation Age of Onset  . Heart disease Father   . Asthma Mother   . Dementia Mother   . Colon cancer Sister 15    Social History:   Social History   Socioeconomic History  . Marital status: Divorced    Spouse name: Not on file  . Number of children: 2  . Years of education: 49  . Highest education level: Not on file  Occupational History  . Occupation: RetiredLand: RETIRED  Tobacco Use  . Smoking status: Current Every Day Smoker     Packs/day: 0.50    Years: 50.00    Pack years: 25.00    Types: Cigarettes  . Smokeless tobacco: Never Used  . Tobacco comment: tobacco info given 01/07/14  Substance and Sexual Activity  . Alcohol use: Yes    Alcohol/week: 0.0 standard drinks    Comment: 2-4 drinks per month, previously heavy  . Drug use: No  . Sexual activity: Never  Other Topics Concern  . 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   Has moved homes x 3 in 2 months   States cannot always afford meds-See PI   Drinks 2 cups of coffee a day    Social Determinants of Health   Financial Resource Strain:   . Difficulty of Paying Living Expenses: Not on file  Food Insecurity:   . Worried About Charity fundraiser in the Last Year: Not on file  . Ran Out of Food in the Last Year: Not on file  Transportation Needs:   . Lack of Transportation (Medical): Not on file  . Lack of Transportation (Non-Medical): Not on file  Physical Activity:   . Days of Exercise per Week: Not on file  . Minutes of Exercise per Session: Not on file  Stress:   . Feeling of Stress : Not on file  Social Connections:   . Frequency of Communication with Friends and Family: Not on file  . Frequency of Social Gatherings with Friends and Family: Not on file  . Attends Religious Services: Not on file  . Active Member of Clubs or Organizations: Not on file  . Attends Archivist Meetings: Not on file  . Marital Status: Not on file    Additional Social History: Grew up with parents normal average childhood.  Currently living with ex-wife and is retired  Allergies:  No Known Allergies  Metabolic Disorder Labs: Lab Results  Component Value Date   HGBA1C 5.5 03/17/2011   MPG 111 03/17/2011   MPG 105 01/03/2011   No results found for: PROLACTIN Lab Results  Component Value Date   CHOL 129 01/04/2011   TRIG 86 01/04/2011   HDL 41  01/04/2011   CHOLHDL 3.1 01/04/2011   VLDL 17 01/04/2011   Leslie 71 01/04/2011   Lab Results  Component Value Date   TSH 1.563 03/17/2011    Therapeutic Level Labs: No results found for: LITHIUM No results found for: CBMZ No results found for: VALPROATE  Current Medications: Current Outpatient Medications  Medication Sig Dispense Refill  . amiodarone (PACERONE) 200 MG tablet Take one tablet by mouth twice daily 60 tablet 3  . apixaban (ELIQUIS) 5 MG TABS tablet Take 5 mg by mouth 2 (two) times daily.    Marland Kitchen  metoprolol succinate (TOPROL-XL) 25 MG 24 hr tablet Take 25 mg by mouth daily.    . rosuvastatin (CRESTOR) 10 MG tablet TAKE 1 TABLET BY MOUTH EVERY DAY    . zolpidem (AMBIEN CR) 12.5 MG CR tablet Take 1 tablet (12.5 mg total) by mouth at bedtime as needed for sleep. Refill when due 30 tablet 0   No current facility-administered medications for this visit.      Psychiatric Specialty Exam: Review of Systems  Cardiovascular: Negative for chest pain.  Skin: Negative for rash.  Psychiatric/Behavioral: Negative for depression.    There were no vitals taken for this visit.There is no height or weight on file to calculate BMI.  General Appearance:  Eye Contact:   Speech:  Normal Rate  Volume:  Normal  Mood: fair  Affect:  Congruent  Thought Process:  Goal Directed  Orientation:  Full (Time, Place, and Person)  Thought Content:  Logical  Suicidal Thoughts:  No  Homicidal Thoughts:  No  Memory:  Immediate;   Fair Recent;   Fair  Judgement:  Fair  Insight:  Fair  Psychomotor Activity:  Normal  Concentration:  Concentration: Fair and Attention Span: Fair  Recall:  [employer] of Knowledge:Fair  Language: Fair  Akathisia:  No  Handed:  Right  AIMS (if indicated):  not done  Assets:  Desire for Improvement  ADL's:  Intact  Cognition: WNL  Sleep:  Fair   Screenings: PHQ2-9     Office Visit from 09/30/2012 in [place] Office Visit  from 09/01/2012 in [place]  PHQ-2 Total Score  0  0      Assessment and Plan: insomnia primary: doing fair, continue ambien Does not want to stop it   I discussed the assessment and treatment plan with the patient. The patient was provided an opportunity to ask questions and all were answered. The patient agreed with the plan and demonstrated an understanding of the instructions.   The patient was advised to call back or seek an in-person evaluation if the symptoms worsen or if the condition fails to improve as anticipated.  I provided 15 minutes of non-face-to-face time during this encounter. Fu 3-42m   Merian Capron, MD 2/26/20219:34 AM

## 2019-06-21 ENCOUNTER — Telehealth (HOSPITAL_COMMUNITY): Payer: Self-pay

## 2019-06-21 MED ORDER — ZOLPIDEM TARTRATE ER 12.5 MG PO TBCR: 12.5000 mg | EXTENDED_RELEASE_TABLET | Freq: Every evening | ORAL | 0 refills | Status: DC | PRN

## 2019-06-21 NOTE — Telephone Encounter (Signed)
[facility] [address]. As of last night he is completely out.

## 2019-06-21 NOTE — Telephone Encounter (Signed)
sent 

## 2019-07-20 ENCOUNTER — Telehealth (INDEPENDENT_AMBULATORY_CARE_PROVIDER_SITE_OTHER): Payer: Medicare Other | Admitting: Psychiatry

## 2019-07-20 ENCOUNTER — Encounter (HOSPITAL_COMMUNITY): Payer: Self-pay | Admitting: Psychiatry

## 2019-07-20 ENCOUNTER — Other Ambulatory Visit (HOSPITAL_COMMUNITY): Payer: Self-pay | Admitting: Psychiatry

## 2019-07-20 DIAGNOSIS — F5101 Primary insomnia: Secondary | ICD-10-CM

## 2019-07-20 MED ORDER — ZOLPIDEM TARTRATE ER 12.5 MG PO TBCR: 12.5000 mg | EXTENDED_RELEASE_TABLET | Freq: Every evening | ORAL | 2 refills | Status: DC | PRN

## 2019-07-20 NOTE — Progress Notes (Signed)
[facility] Outpatient visit   Via Tele psych Patient Identification: [name] MRN:  [id number] Date of Evaluation:  07/20/2019 Referral Source: primary care Chief Complaint:   med review. insomnia Visit Diagnosis:    ICD-10-CM   1. 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 Roland Earl in [facility] psychiatry clinic. Referred by primary care for insomnia   I connected with [name] on 07/20/19 at  4:30 PM EDT by telephone and verified that I am speaking with the correct person using two identifiers.   I discussed the limitations, risks, security and privacy concerns of performing an evaluation and management service by telephone and the availability of in person appointments. I also discussed with the patient that there may be a patient responsible charge related to this service. The patient expressed understanding and agreed to proceed.  Continues to do fair on ambien Without which have difficulty sleeping  History of depression around 2015   Aggravating factor:CAD, AF Modifying factor: x wife , meds      Past Psychiatric History: anxeity, insomnia  Previous Psychotropic Medications: Yes   Substance Abuse History in the last 12 months:  Yes.   Some [place] of beers on weekends  Past Medical History:  Past Medical History:  Diagnosis Date  . A-fib ([place])   . ALCOHOL ABUSE 01/18/2009  . Anxiety   . Colon cancer ([place])   . Coronary artery disease    s/p CABG  . Depression   . 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 03/21/2011 - 2D Echo: EF 25% to 30%. Diffuse hypokinesis.  . Fatigue   . H/O alcohol abuse   . HEMORRHOIDS 01/17/2009  . Hepatitis    hepatitis B-never treated-Titers were low  . HLD (hyperlipidemia)   . HTN (hypertension)   . Insomnia    TAKES ATIVAN TO SLEEP  . Left shoulder pain   . Persistent atrial fibrillation ([place])    diagnosed 2010  . Rhonchi     Past  Surgical History:  Procedure Laterality Date  . APPENDECTOMY  2019  . CARDIAC CATHETERIZATION    . COLON SURGERY  07/2012   COLON CANCER  . COLONOSCOPY N/A 02/11/2013   Procedure: COLONOSCOPY;  Surgeon: Milus Banister, MD;  [facility];  Service: Endoscopy;  Laterality: N/A;  . COLONOSCOPY WITH PROPOFOL N/A 05/02/2016   Procedure: COLONOSCOPY WITH PROPOFOL;  Surgeon: Milus Banister, MD;  [facility];  Service: Endoscopy;  Laterality: N/A;  . CORONARY ARTERY BYPASS GRAFT     triple  bypass OCT 2013 in [place] N/A 08/30/2014   Procedure: CARDIOVERSION;  Surgeon: [name], MD;  Location: [facility]S;  Service: Cardiovascular;  Laterality: N/A;  . ELECTROPHYSIOLOGIC STUDY N/A 09/29/2014   Procedure: CARDIOVERSION;  Surgeon: [name], MD;  Location: [place] [facility];  Service: Cardiovascular;  Laterality: N/A;  . FLEXIBLE SIGMOIDOSCOPY N/A 12/16/2013   Procedure: FLEXIBLE SIGMOIDOSCOPY;  Surgeon: Milus Banister, MD;  [facility];  Service: Endoscopy;  Laterality: N/A;  . HEMORRHOID SURGERY N/A 03/09/2013   Procedure: HEMORRHOIDECTOMY;  Surgeon: Pedro Earls, MD;  [facility];  Service: General;  Laterality: N/A;  . MOUTH SURGERY      Family Psychiatric History: denies   Family History:  Family History  Problem Relation Age of Onset  . Heart disease Father   . Asthma Mother   .  Dementia Mother   . Colon cancer Sister 3    Social History:   Social History   Socioeconomic History  . Marital status: Divorced    Spouse name: Not on file  . Number of children: 2  . Years of education: 45  . Highest education level: Not on file  Occupational History  . Occupation: RetiredLand: RETIRED  Tobacco Use  . Smoking status: Current Every Day Smoker    Packs/day: 0.50    Years: 50.00    Pack years: 25.00    Types: Cigarettes  . Smokeless tobacco: Never Used  . Tobacco comment:  tobacco info given 01/07/14  Substance and Sexual Activity  . Alcohol use: Yes    Alcohol/week: 0.0 standard drinks    Comment: 2-4 drinks per month, previously heavy  . Drug use: No  . Sexual activity: Never  Other Topics Concern  . 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   Has moved homes x 3 in 2 months   States cannot always afford meds-See PI   Drinks 2 cups of coffee a day    Social Determinants of Health   Financial Resource Strain:   . Difficulty of Paying Living Expenses:   Food Insecurity:   . Worried About Charity fundraiser in the Last Year:   . Arboriculturist in the Last Year:   Transportation Needs:   . Film/video editor (Medical):   Marland Kitchen Lack of Transportation (Non-Medical):   Physical Activity:   . Days of Exercise per Week:   . Minutes of Exercise per Session:   Stress:   . Feeling of Stress :   Social Connections:   . Frequency of Communication with Friends and Family:   . Frequency of Social Gatherings with Friends and Family:   . Attends Religious Services:   . Active Member of Clubs or Organizations:   . Attends Archivist Meetings:   Marland Kitchen Marital Status:     Additional Social History: Grew up with parents normal average childhood.  Currently living with ex-wife and is retired  Allergies:  No Known Allergies  Metabolic Disorder Labs: Lab Results  Component Value Date   HGBA1C 5.5 03/17/2011   MPG 111 03/17/2011   MPG 105 01/03/2011   No results found for: PROLACTIN Lab Results  Component Value Date   CHOL 129 01/04/2011   TRIG 86 01/04/2011   HDL 41 01/04/2011   CHOLHDL 3.1 01/04/2011   VLDL 17 01/04/2011   [place] 71 01/04/2011   Lab Results  Component Value Date   TSH 1.563 03/17/2011    Therapeutic Level Labs: No results found for: LITHIUM No results found for: CBMZ No results found for: VALPROATE  Current  Medications: Current Outpatient Medications  Medication Sig Dispense Refill  . amiodarone (PACERONE) 200 MG tablet Take one tablet by mouth twice daily 60 tablet 3  . apixaban (ELIQUIS) 5 MG TABS tablet Take 5 mg by mouth 2 (two) times daily.    . metoprolol succinate (TOPROL-XL) 25 MG 24 hr tablet Take 25 mg by mouth daily.    . rosuvastatin (CRESTOR) 10 MG tablet TAKE 1 TABLET BY MOUTH EVERY DAY    . zolpidem (AMBIEN CR) 12.5 MG CR tablet Take 1 tablet (12.5 mg total) by mouth at bedtime as needed for sleep. Refill when due  30 tablet 2   No current facility-administered medications for this visit.      Psychiatric Specialty Exam: Review of Systems  Cardiovascular: Negative for chest pain.  Skin: Negative for rash.  Psychiatric/Behavioral: Negative for depression.    There were no vitals taken for this visit.There is no height or weight on file to calculate BMI.  General Appearance:  Eye Contact:   Speech:  Normal Rate  Volume:  Normal  Mood: fair  Affect:  Congruent  Thought Process:  Goal Directed  Orientation:  Full (Time, Place, and Person)  Thought Content:  Logical  Suicidal Thoughts:  No  Homicidal Thoughts:  No  Memory:  Immediate;   Fair Recent;   Fair  Judgement:  Fair  Insight:  Fair  Psychomotor Activity:  Normal  Concentration:  Concentration: Fair and Attention Span: Fair  Recall:  [employer] of Knowledge:Fair  Language: Fair  Akathisia:  No  Handed:  Right  AIMS (if indicated):  not done  Assets:  Desire for Improvement  ADL's:  Intact  Cognition: WNL  Sleep:  Fair   Screenings: PHQ2-9     Office Visit from 09/30/2012 in [place] Office Visit from 09/01/2012 in [place]  PHQ-2 Total Score  0  0      Assessment and Plan: insomnia primary: doing fair, continue ambien, discussed risk benefits  Refills sent   I discussed the assessment and treatment plan with the patient. The  patient was provided an opportunity to ask questions and all were answered. The patient agreed with the plan and demonstrated an understanding of the instructions.   The patient was advised to call back or seek an in-person evaluation if the symptoms worsen or if the condition fails to improve as anticipated.  I provided 15 minutes of non-face-to-face time during this encounter. Fu 3-76m.    Merian Capron, MD 5/11/20214:36 PM

## 2019-11-16 ENCOUNTER — Telehealth (HOSPITAL_COMMUNITY): Payer: Self-pay

## 2019-11-16 MED ORDER — ZOLPIDEM TARTRATE ER 12.5 MG PO TBCR: 12.5000 mg | EXTENDED_RELEASE_TABLET | Freq: Every evening | ORAL | 0 refills | Status: DC | PRN

## 2019-11-16 NOTE — Telephone Encounter (Signed)
Patient needs a refill on Ambien sent to [facility] on [address] in [place]

## 2019-11-16 NOTE — Telephone Encounter (Signed)
sent 

## 2019-11-22 ENCOUNTER — Encounter (HOSPITAL_COMMUNITY): Payer: Self-pay | Admitting: Psychiatry

## 2019-11-22 ENCOUNTER — Telehealth (INDEPENDENT_AMBULATORY_CARE_PROVIDER_SITE_OTHER): Payer: Medicare Other | Admitting: Psychiatry

## 2019-11-22 DIAGNOSIS — F5101 Primary insomnia: Secondary | ICD-10-CM | POA: Diagnosis not present

## 2019-11-22 MED ORDER — ZOLPIDEM TARTRATE ER 12.5 MG PO TBCR: 12.5000 mg | EXTENDED_RELEASE_TABLET | Freq: Every evening | ORAL | 5 refills | Status: DC | PRN

## 2019-11-22 NOTE — Progress Notes (Signed)
[name] Outpatient visit   Via Tele psych Patient Identification: [name] MRN:  [id number] Date of Evaluation:  11/22/2019 Referral Source: primary care Chief Complaint:   med review. insomnia Visit Diagnosis:    ICD-10-CM   1. 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 Roland Archer in [facility] psychiatry clinic. Referred by primary care for insomnia    I connected with [name] on 11/22/19 at  4:00 PM EDT by telephone and verified that I am speaking with the correct person using two identifiers.  I discussed the limitations, risks, security and privacy concerns of performing an evaluation and management service by telephone and the availability of in person appointments. I also discussed with the patient that there may be a patient responsible charge related to this service. The patient expressed understanding and agreed to proceed.   Patient location home Provider location Home office  Continues to do fair on ambien  Has activities keeps him busy and grand kids, recent twins  History of depression around 64   Aggravating factor:CAD, AF Modifying factor: x wife , meds      Past Psychiatric History: anxeity, insomnia  Previous Psychotropic Medications: Yes   Substance Abuse History in the last 12 months:  Yes.   Some [place] of beers on weekends  Past Medical History:  Past Medical History:  Diagnosis Date  . A-fib ([place])   . ALCOHOL ABUSE 01/18/2009  . Anxiety   . Colon cancer ([place])   . Coronary artery disease    s/p CABG  . Depression   . 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 03/21/2011 - 2D Echo: EF 25% to 30%. Diffuse hypokinesis.  . Fatigue   . H/O alcohol abuse   . HEMORRHOIDS 01/17/2009  . Hepatitis    hepatitis B-never treated-Titers were low  . HLD (hyperlipidemia)   . HTN (hypertension)   . Insomnia    TAKES ATIVAN TO SLEEP  . Left shoulder pain   .  Persistent atrial fibrillation ([place])    diagnosed 2010  . Rhonchi     Past Surgical History:  Procedure Laterality Date  . APPENDECTOMY  2019  . CARDIAC CATHETERIZATION    . COLON SURGERY  07/2012   COLON CANCER  . COLONOSCOPY N/A 02/11/2013   Procedure: COLONOSCOPY;  Surgeon: Milus Banister, MD;  [facility];  Service: Endoscopy;  Laterality: N/A;  . COLONOSCOPY WITH PROPOFOL N/A 05/02/2016   Procedure: COLONOSCOPY WITH PROPOFOL;  Surgeon: Milus Banister, MD;  [facility];  Service: Endoscopy;  Laterality: N/A;  . CORONARY ARTERY BYPASS GRAFT     triple  bypass OCT 2013 in [place] N/A 08/30/2014   Procedure: CARDIOVERSION;  Surgeon: [name], MD;  Location: [facility]S;  Service: Cardiovascular;  Laterality: N/A;  . ELECTROPHYSIOLOGIC STUDY N/A 09/29/2014   Procedure: CARDIOVERSION;  Surgeon: [name], MD;  Location: [place] [facility];  Service: Cardiovascular;  Laterality: N/A;  . FLEXIBLE SIGMOIDOSCOPY N/A 12/16/2013   Procedure: FLEXIBLE SIGMOIDOSCOPY;  Surgeon: Milus Banister, MD;  [facility];  Service: Endoscopy;  Laterality: N/A;  . HEMORRHOID SURGERY N/A 03/09/2013   Procedure: HEMORRHOIDECTOMY;  Surgeon: Pedro Earls, MD;  [facility];  Service: General;  Laterality: N/A;  . MOUTH SURGERY      Family Psychiatric History: denies   Family History:  Family History  Problem Relation Age of Onset  . Heart disease Father   . Asthma Mother   . Dementia Mother   . Colon cancer Sister 32    Social History:   Social History   Socioeconomic History  . Marital status: Divorced    Spouse name: Not on file  . Number of children: 2  . Years of education: 13  . Highest education level: Not on file  Occupational History  . Occupation: RetiredLand: RETIRED  Tobacco Use  . Smoking status: Current Every Day Smoker    Packs/day: 0.50    Years: 50.00    Pack years: 25.00     Types: Cigarettes  . Smokeless tobacco: Never Used  . Tobacco comment: tobacco info given 01/07/14  Vaping Use  . Vaping Use: Never used  Substance and Sexual Activity  . Alcohol use: Yes    Alcohol/week: 0.0 standard drinks    Comment: 2-4 drinks per month, previously heavy  . Drug use: No  . Sexual activity: Never  Other Topics Concern  . 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   Has moved homes x 3 in 2 months   States cannot always afford meds-See PI   Drinks 2 cups of coffee a day    Social Determinants of Health   Financial Resource Strain:   . Difficulty of Paying Living Expenses: Not on file  Food Insecurity:   . Worried About Charity fundraiser in the Last Year: Not on file  . Ran Out of Food in the Last Year: Not on file  Transportation Needs:   . Lack of Transportation (Medical): Not on file  . Lack of Transportation (Non-Medical): Not on file  Physical Activity:   . Days of Exercise per Week: Not on file  . Minutes of Exercise per Session: Not on file  Stress:   . Feeling of Stress : Not on file  Social Connections:   . Frequency of Communication with Friends and Family: Not on file  . Frequency of Social Gatherings with Friends and Family: Not on file  . Attends Religious Services: Not on file  . Active Member of Clubs or Organizations: Not on file  . Attends Archivist Meetings: Not on file  . Marital Status: Not on file    Additional Social History: Grew up with parents normal average childhood.  Currently living with ex-wife and is retired  Allergies:  No Known Allergies  Metabolic Disorder Labs: Lab Results  Component Value Date   HGBA1C 5.5 03/17/2011   MPG 111 03/17/2011   MPG 105 01/03/2011   No results found for: PROLACTIN Lab Results  Component Value Date   CHOL 129 01/04/2011   TRIG 86 01/04/2011   HDL 41 01/04/2011   CHOLHDL 3.1  01/04/2011   VLDL 17 01/04/2011   [place] 71 01/04/2011   Lab Results  Component Value Date   TSH 1.563 03/17/2011    Therapeutic Level Labs: No results found for: LITHIUM No results found for: CBMZ No results found for: VALPROATE  Current Medications: Current Outpatient Medications  Medication Sig Dispense Refill  . amiodarone (PACERONE) 200 MG tablet Take one tablet by mouth twice daily 60 tablet 3  . apixaban (ELIQUIS) 5 MG TABS tablet Take 5 mg by mouth 2 (two) times daily.    . metoprolol succinate (TOPROL-XL)  25 MG 24 hr tablet Take 25 mg by mouth daily.    . rosuvastatin (CRESTOR) 10 MG tablet TAKE 1 TABLET BY MOUTH EVERY DAY    . zolpidem (AMBIEN CR) 12.5 MG CR tablet Take 1 tablet (12.5 mg total) by mouth at bedtime as needed for sleep. Refill when due 30 tablet 5   No current facility-administered medications for this visit.      Psychiatric Specialty Exam: Review of Systems  Cardiovascular: Negative for chest pain.  Skin: Negative for rash.  Psychiatric/Behavioral: Negative for depression.    There were no vitals taken for this visit.There is no height or weight on file to calculate BMI.  General Appearance:  Eye Contact:   Speech:  Normal Rate  Volume:  Normal  Mood:fair  Affect:  Congruent  Thought Process:  Goal Directed  Orientation:  Full (Time, Place, and Person)  Thought Content:  Logical  Suicidal Thoughts:  No  Homicidal Thoughts:  No  Memory:  Immediate;   Fair Recent;   Fair  Judgement:  Fair  Insight:  Fair  Psychomotor Activity:  Normal  Concentration:  Concentration: Fair and Attention Span: Fair  Recall:  [employer] of Knowledge:Fair  Language: Fair  Akathisia:  No  Handed:  Right  AIMS (if indicated):  not done  Assets:  Desire for Improvement  ADL's:  Intact  Cognition: WNL  Sleep:  Fair   Screenings: PHQ2-9     Office Visit from 09/30/2012 in [place] Office Visit from 09/01/2012 in [place]  PHQ-2 Total Score 0 0      Assessment and Plan:   insomnia primary: doing fair, continue ambien, discussed risk and benefits  Refills sent   I discussed the assessment and treatment plan with the patient. The patient was provided an opportunity to ask questions and all were answered. The patient agreed with the plan and demonstrated an understanding of the instructions.   The patient was advised to call back or seek an in-person evaluation if the symptoms worsen or if the condition fails to improve as anticipated.  I provided 15 minutes of non-face-to-face time during this encounter. Fu 60m   Merian Capron, MD 9/13/20214:09 PM

## 2020-05-22 ENCOUNTER — Telehealth (INDEPENDENT_AMBULATORY_CARE_PROVIDER_SITE_OTHER): Payer: Medicare Other | Admitting: Psychiatry

## 2020-05-22 ENCOUNTER — Encounter (HOSPITAL_COMMUNITY): Payer: Self-pay | Admitting: Psychiatry

## 2020-05-22 DIAGNOSIS — F5101 Primary insomnia: Secondary | ICD-10-CM | POA: Diagnosis not present

## 2020-05-22 MED ORDER — ZOLPIDEM TARTRATE ER 12.5 MG PO TBCR: 12.5000 mg | EXTENDED_RELEASE_TABLET | Freq: Every evening | ORAL | 5 refills | Status: DC | PRN

## 2020-05-22 NOTE — Progress Notes (Signed)
[facility] Outpatient visit   Via Tele psych Patient Identification: [name] MRN:  [id number] Date of Evaluation:  05/22/2020 Referral Source: primary care Chief Complaint:   med review. insomnia Visit Diagnosis:    ICD-10-CM   1. Primary insomnia  F51.01    Virtual Visit via Telephone Note  I connected with [name] on 05/22/20 at  3:00 PM EDT by telephone and verified that I am speaking with the correct person using two identifiers.  Location: Patient: home Provider: home office   I discussed the limitations, risks, security and privacy concerns of performing an evaluation and management service by telephone and the availability of in person appointments. I also discussed with the patient that there may be a patient responsible charge related to this service. The patient expressed understanding and agreed to proceed.        I discussed the assessment and treatment plan with the patient. The patient was provided an opportunity to ask questions and all were answered. The patient agreed with the plan and demonstrated an understanding of the instructions.   The patient was advised to call back or seek an in-person evaluation if the symptoms worsen or if the condition fails to improve as anticipated.  I provided 10 minutes of non-face-to-face time during this encounter.   Merian Capron, MD  History of Present Illness: 75 years old currently living with his ex-wife has seen Dr. Roland Earl in [facility] psychiatry clinic. Referred by primary care for insomnia     Doing well on ambien, sleeps fair grandkids keep busy and engaged Stress level is low  History of depression around 2015   Aggravating factor:CAD, AF Modifying factor: x wife , meds      Past Psychiatric History: anxeity, insomnia  Previous Psychotropic Medications: Yes   Substance Abuse History in the last 12 months:  Yes.   Some [place] of beers on weekends  Past Medical History:  Past Medical  History:  Diagnosis Date  . A-fib ([place])   . ALCOHOL ABUSE 01/18/2009  . Anxiety   . Colon cancer ([place])   . Coronary artery disease    s/p CABG  . Depression   . 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 03/21/2011 - 2D Echo: EF 25% to 30%. Diffuse hypokinesis.  . Fatigue   . H/O alcohol abuse   . HEMORRHOIDS 01/17/2009  . Hepatitis    hepatitis B-never treated-Titers were low  . HLD (hyperlipidemia)   . HTN (hypertension)   . Insomnia    TAKES ATIVAN TO SLEEP  . Left shoulder pain   . Persistent atrial fibrillation ([place])    diagnosed 2010  . Rhonchi     Past Surgical History:  Procedure Laterality Date  . APPENDECTOMY  2019  . CARDIAC CATHETERIZATION    . COLON SURGERY  07/2012   COLON CANCER  . COLONOSCOPY N/A 02/11/2013   Procedure: COLONOSCOPY;  Surgeon: Milus Banister, MD;  [facility];  Service: Endoscopy;  Laterality: N/A;  . COLONOSCOPY WITH PROPOFOL N/A 05/02/2016   Procedure: COLONOSCOPY WITH PROPOFOL;  Surgeon: Milus Banister, MD;  [facility];  Service: Endoscopy;  Laterality: N/A;  . CORONARY ARTERY BYPASS GRAFT     triple  bypass OCT 2013 in [place] N/A 08/30/2014   Procedure: CARDIOVERSION;  Surgeon: [name], MD;  Location: [facility] ORS;  Service: Cardiovascular;  Laterality: N/A;  . ELECTROPHYSIOLOGIC STUDY N/A 09/29/2014   Procedure: CARDIOVERSION;  Surgeon: [name], MD;  Location: [facility] [facility];  Service: Cardiovascular;  Laterality: N/A;  . FLEXIBLE SIGMOIDOSCOPY N/A 12/16/2013   Procedure: FLEXIBLE SIGMOIDOSCOPY;  Surgeon: Milus Banister, MD;  [facility];  Service: Endoscopy;  Laterality: N/A;  . HEMORRHOID SURGERY N/A 03/09/2013   Procedure: HEMORRHOIDECTOMY;  Surgeon: Pedro Earls, MD;  [facility];  Service: General;  Laterality: N/A;  . MOUTH SURGERY      Family Psychiatric History: denies   Family History:  Family History  Problem Relation Age of  Onset  . Heart disease Father   . Asthma Mother   . Dementia Mother   . Colon cancer Sister 22    Social History:   Social History   Socioeconomic History  . Marital status: Divorced    Spouse name: Not on file  . Number of children: 2  . Years of education: 66  . Highest education level: Not on file  Occupational History  . Occupation: RetiredLand: RETIRED  Tobacco Use  . Smoking status: Current Every Day Smoker    Packs/day: 0.50    Years: 50.00    Pack years: 25.00    Types: Cigarettes  . Smokeless tobacco: Never Used  . Tobacco comment: tobacco info given 01/07/14  Vaping Use  . Vaping Use: Never used  Substance and Sexual Activity  . Alcohol use: Yes    Alcohol/week: 0.0 standard drinks    Comment: 2-4 drinks per month, previously heavy  . Drug use: No  . Sexual activity: Never  Other Topics Concern  . 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   Has moved homes x 3 in 2 months   States cannot always afford meds-See PI   Drinks 2 cups of coffee a day    Social Determinants of Health   Financial Resource Strain: Not on file  Food Insecurity: Not on file  Transportation Needs: Not on file  Physical Activity: Not on file  Stress: Not on file  Social Connections: Not on file      Allergies:  No Known Allergies  Metabolic Disorder Labs: Lab Results  Component Value Date   HGBA1C 5.5 03/17/2011   MPG 111 03/17/2011   MPG 105 01/03/2011   No results found for: PROLACTIN Lab Results  Component Value Date   CHOL 129 01/04/2011   TRIG 86 01/04/2011   HDL 41 01/04/2011   CHOLHDL 3.1 01/04/2011   VLDL 17 01/04/2011   LDLCALC 71 01/04/2011   Lab Results  Component Value Date   TSH 1.563 03/17/2011    Therapeutic Level Labs: No results found for: LITHIUM No results found for: CBMZ No results found for: VALPROATE  Current  Medications: Current Outpatient Medications  Medication Sig Dispense Refill  . amiodarone (PACERONE) 200 MG tablet Take one tablet by mouth twice daily 60 tablet 3  . apixaban (ELIQUIS) 5 MG TABS tablet Take 5 mg by mouth 2 (two) times daily.    . metoprolol succinate (TOPROL-XL) 25 MG 24 hr tablet Take 25 mg by mouth daily.    . rosuvastatin (CRESTOR) 10 MG tablet TAKE 1 TABLET BY MOUTH EVERY DAY    . zolpidem (AMBIEN CR) 12.5 MG CR tablet Take 1 tablet (12.5 mg total) by mouth at bedtime as needed for sleep. Refill when due 30 tablet 5   No current  facility-administered medications for this visit.      Psychiatric Specialty Exam: Review of Systems  Cardiovascular: Negative for chest pain.  Psychiatric/Behavioral: Negative for depression.    There were no vitals taken for this visit.There is no height or weight on file to calculate BMI.  General Appearance:  Eye Contact:   Speech:  Normal Rate  Volume:  Normal  Mood:fair  Affect:  Congruent  Thought Process:  Goal Directed  Orientation:  Full (Time, Place, and Person)  Thought Content:  Logical  Suicidal Thoughts:  No  Homicidal Thoughts:  No  Memory:  Immediate;   Fair Recent;   Fair  Judgement:  Fair  Insight:  Fair  Psychomotor Activity:  Normal  Concentration:  Concentration: Fair and Attention Span: Fair  Recall:  [employer] of Knowledge:Fair  Language: Fair  Akathisia:  No  Handed:  Right  AIMS (if indicated):  not done  Assets:  Desire for Improvement  ADL's:  Intact  Cognition: WNL  Sleep:  Fair   Screenings: PHQ2-9   Flowsheet Row Video Visit from 05/22/2020 in [place] Office Visit from 09/30/2012 in [place] Office Visit from 09/01/2012 in [place]  PHQ-2 Total Score 0 0 0    Flowsheet Row Video Visit from 05/22/2020 in [place] No  Risk      Assessment and Plan:   insomnia primary: doing fair, reviewed sleep hygiene, continue [place] , refills sent   Fu 32m   Merian Capron, MD 3/14/20223:11 PM

## 2020-11-14 ENCOUNTER — Encounter (HOSPITAL_COMMUNITY): Payer: Self-pay | Admitting: Psychiatry

## 2020-11-14 ENCOUNTER — Ambulatory Visit (INDEPENDENT_AMBULATORY_CARE_PROVIDER_SITE_OTHER): Payer: Medicare Other | Admitting: Psychiatry

## 2020-11-14 DIAGNOSIS — F5101 Primary insomnia: Secondary | ICD-10-CM

## 2020-11-14 MED ORDER — ZOLPIDEM TARTRATE ER 12.5 MG PO TBCR: 12.5000 mg | EXTENDED_RELEASE_TABLET | Freq: Every evening | ORAL | 5 refills | Status: DC | PRN

## 2020-11-14 NOTE — Progress Notes (Signed)
[facility] Outpatient visit   Via Tele psych Patient Identification: [name] MRN:  [id number] Date of Evaluation:  11/14/2020 Referral Source: primary care Chief Complaint:   med review. insomnia Visit Diagnosis:    ICD-10-CM   1. Primary insomnia  F51.01             History of Present Illness: 75 years old currently living with his ex-wife has seen Dr. Roland Earl in [facility] psychiatry clinic. Referred by primary care for insomnia   Patient continues to do well on Ambien goes to bed around 10:00 and wakes up after 6 to 7 hours  No residual effect or sleepiness during the daytime keeps himself active  Stress level is low  History of depression around 2015   Aggravating factor:CAD, atrial fibrillation Modifying factor:  , meds      Past Psychiatric History: anxeity, insomnia  Previous Psychotropic Medications: Yes   Substance Abuse History in the last 12 months:  Yes.   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 03/21/2011 - 2D Echo: EF 25% to 30%. Diffuse hypokinesis.   Fatigue    H/O alcohol abuse    HEMORRHOIDS 01/17/2009   Hepatitis    hepatitis B-never treated-Titers were low   HLD (hyperlipidemia)    HTN (hypertension)    Insomnia    TAKES ATIVAN TO SLEEP   Left shoulder pain    Persistent atrial fibrillation ([place])    diagnosed 2010   Rhonchi     Past Surgical History:  Procedure Laterality Date   APPENDECTOMY  2019   CARDIAC CATHETERIZATION     COLON SURGERY  07/2012   COLON CANCER   COLONOSCOPY N/A 02/11/2013   Procedure: COLONOSCOPY;  Surgeon: Milus Banister, MD;  [facility];  Service: Endoscopy;  Laterality: N/A;   COLONOSCOPY WITH PROPOFOL N/A 05/02/2016   Procedure: COLONOSCOPY WITH  PROPOFOL;  Surgeon: Milus Banister, MD;  [facility];  Service: Endoscopy;  Laterality: N/A;   CORONARY ARTERY BYPASS GRAFT     triple  bypass OCT 2013 in [place] 08/30/2014   Procedure: CARDIOVERSION;  Surgeon: [name], MD;  Location: [facility];  Service: Cardiovascular;  Laterality: N/A;   ELECTROPHYSIOLOGIC STUDY N/A 09/29/2014   Procedure: CARDIOVERSION;  Surgeon: [name], MD;  Location: [facility];  Service: Cardiovascular;  Laterality: N/A;   FLEXIBLE SIGMOIDOSCOPY N/A 12/16/2013   Procedure: FLEXIBLE SIGMOIDOSCOPY;  Surgeon: Milus Banister, MD;  [facility];  Service: Endoscopy;  Laterality: N/A;   HEMORRHOID SURGERY N/A 03/09/2013   Procedure: HEMORRHOIDECTOMY;  Surgeon: Pedro Earls, MD;  [facility];  Service: General;  Laterality: N/A;   MOUTH SURGERY      Family Psychiatric History: denies   Family History:  Family History  Problem Relation Age of Onset   Heart disease Father    Asthma Mother    Dementia Mother    Colon cancer Sister 54    Social History:   Social History   Socioeconomic History   Marital status: Divorced    Spouse name: Not on file   Number of children: 2   Years of  education: 12   Highest education level: Not on file  Occupational History   Occupation: Retired[employer]: RETIRED  Tobacco Use   Smoking status: Every Day    Packs/day: 0.50    Years: 50.00    Pack years: 25.00    Types: Cigarettes   Smokeless tobacco: Never   Tobacco comments:    tobacco info given 01/07/14  Vaping Use   Vaping Use: Never used  Substance and Sexual Activity   Alcohol use: Yes    Alcohol/week: 0.0 standard drinks    Comment: 2-4 drinks per month, previously heavy   Drug use: No   Sexual activity: Never  Other Topics Concern   Not on file  Social History Narrative   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   Has moved homes x 3 in 2 months   States cannot always afford meds-See PI   Drinks 2 cups of coffee a day    Social Determinants of Health   Financial Resource Strain: Not on file  Food Insecurity: Not on file  Transportation Needs: Not on file  Physical Activity: Not on file  Stress: Not on file  Social Connections: Not on file      Allergies:  No Known Allergies  Metabolic Disorder Labs: Lab Results  Component Value Date   HGBA1C 5.5 03/17/2011   MPG 111 03/17/2011   MPG 105 01/03/2011   No results found for: PROLACTIN Lab Results  Component Value Date   CHOL 129 01/04/2011   TRIG 86 01/04/2011   HDL 41 01/04/2011   CHOLHDL 3.1 01/04/2011   VLDL 17 01/04/2011   LDLCALC 71 01/04/2011   Lab Results  Component Value Date   TSH 1.563 03/17/2011    Therapeutic Level Labs: No results found for: LITHIUM No results found for: CBMZ No results found for: VALPROATE  Current Medications: Current Outpatient Medications  Medication Sig Dispense Refill   amiodarone (PACERONE) 200 MG tablet Take one tablet by mouth twice daily 60 tablet 3   apixaban (ELIQUIS) 5 MG TABS tablet Take 5 mg by mouth 2 (two) times daily.     metoprolol succinate (TOPROL-XL) 25 MG 24 hr tablet Take 25 mg by mouth daily.     rosuvastatin (CRESTOR) 10 MG tablet TAKE 1 TABLET BY MOUTH EVERY DAY     zolpidem (AMBIEN CR) 12.5 MG CR tablet Take 1 tablet (12.5 mg total) by mouth at bedtime as needed for sleep. Refill when due 30 tablet 5   No current facility-administered medications for this visit.      Psychiatric Specialty Exam: Review of Systems  Cardiovascular:  Negative for chest pain.  Psychiatric/Behavioral:  Negative for depression.    There were no vitals taken for this visit.There is no height or weight on file to calculate BMI.  General Appearance:  Eye contact: Fair  Speech:  Normal Rate  Volume:  Normal  Mood:fair  Affect:  Congruent  Thought Process:  Goal Directed   Orientation:  Full (Time, Place, and Person)  Thought Content:  Logical  Suicidal Thoughts:  No  Homicidal Thoughts:  No  Memory:  Immediate;   Fair Recent;   Fair  Judgement:  Fair  Insight:  Fair  Psychomotor Activity:  Normal  Concentration:  Concentration: Fair and Attention Span: Fair  Recall:  [employer] of Knowledge:Fair  Language: Fair  Akathisia:  No  Handed:  Right  AIMS (if indicated):  not done  Assets:  Desire for Improvement  ADL's:  Intact  Cognition: WNL  Sleep:  Fair   Screenings: PHQ2-9    Flowsheet Row Video Visit from 05/22/2020 in [place] Office Visit from 09/30/2012 in [place] Office Visit from 09/01/2012 in [place]  PHQ-2 Total Score 0 0 0      [place] Office Visit from 11/14/2020 in [place] Video Visit from 05/22/2020 in [place] No Risk No Risk       Assessment and Plan:  Prior documentation reviewed insomnia primary: Doing fair continue working sleep hygiene and Ambien reviewed medication side effects and medication renewed   Fu 12mTime spent in office face-to-face 12 minutes  NMerian Capron MD 9/6/202210:09 AM

## 2021-05-21 ENCOUNTER — Encounter: Payer: Self-pay | Admitting: Gastroenterology

## 2021-06-04 ENCOUNTER — Telehealth (HOSPITAL_COMMUNITY): Payer: Self-pay | Admitting: Psychiatry

## 2021-06-04 MED ORDER — ZOLPIDEM TARTRATE ER 12.5 MG PO TBCR: 12.5000 mg | EXTENDED_RELEASE_TABLET | Freq: Every evening | ORAL | 5 refills | Status: DC | PRN

## 2021-06-04 NOTE — Telephone Encounter (Signed)
Refill: ? ?zolpidem (AMBIEN CR) 12.5 MG CR tablet ? ?Send To: ? ?[id number] [place] [place] - [address] ?

## 2021-06-12 ENCOUNTER — Encounter (HOSPITAL_COMMUNITY): Payer: Self-pay | Admitting: Psychiatry

## 2021-06-12 ENCOUNTER — Ambulatory Visit (INDEPENDENT_AMBULATORY_CARE_PROVIDER_SITE_OTHER): Payer: Medicare Other | Admitting: Psychiatry

## 2021-06-12 VITALS — BP 116/72 | HR 62 | Temp 98.5°F | Ht 75.0 in | Wt 212.0 lb

## 2021-06-12 DIAGNOSIS — F5101 Primary insomnia: Secondary | ICD-10-CM

## 2021-06-12 NOTE — Progress Notes (Signed)
[facility] Outpatient visit   ?Via Tele psych ?Patient Identification: [name] ?MRN:  [id number] ?Date of Evaluation:  06/12/2021 ?Referral Source: primary care ?Chief Complaint:   ?med review. insomnia ?Visit Diagnosis:  ?  ICD-10-CM   ?1. Primary insomnia  F51.01   ?  ? ? ? ? ? ? ? ? ?History of Present Illness: 76 years old currently living with his ex-wife has seen Dr. Roland Earl in [facility] psychiatry clinic. Referred by primary care for insomnia ? ?Doing well with Lorrin Mais, gets 6 hours of sleep and keeps fresh during day ?Wife supportive ? Spends time outdoor and woodwork helps ? ? ?Stress level is low ? ?History of depression around 2015  ? ?Aggravating factor:CAD, atrial fibrillation ?Modifying factor:wife ? ?, meds ? ? ? ? ? ?Past Psychiatric History: anxeity, insomnia ? ?Previous Psychotropic Medications: Yes  ? ?Substance Abuse History in the last 12 months:  Yes.   ?Some [place] of beers on weekends ? ?Past Medical History:  ?Past Medical History:  ?Diagnosis Date  ? A-fib ([place])   ? ALCOHOL ABUSE 01/18/2009  ? Anxiety   ? Colon cancer ([place])   ? Coronary artery disease   ? s/p CABG  ? Depression   ? 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 A. 03/21/2011 - 2D Echo: EF 25% to 30%. Diffuse hypokinesis.  ? Fatigue   ? H/O alcohol abuse   ? HEMORRHOIDS 01/17/2009  ? Hepatitis   ? hepatitis B-never treated-Titers were low  ? HLD (hyperlipidemia)   ? HTN (hypertension)   ? Insomnia   ? TAKES ATIVAN TO SLEEP  ? Left shoulder pain   ? Persistent atrial fibrillation ([place])   ? diagnosed 2010  ? Rhonchi   ?  ?Past Surgical History:  ?Procedure Laterality Date  ? APPENDECTOMY  2019  ? CARDIAC CATHETERIZATION    ? COLON SURGERY  07/2012  ? COLON CANCER  ? COLONOSCOPY N/A 02/11/2013  ? Procedure: COLONOSCOPY;  Surgeon: Milus Banister, MD;  [facility];  Service: Endoscopy;  Laterality: N/A;  ? COLONOSCOPY WITH PROPOFOL N/A 05/02/2016  ? Procedure: COLONOSCOPY WITH PROPOFOL;  Surgeon: Milus Banister, MD;   [facility];  Service: Endoscopy;  Laterality: N/A;  ? CORONARY ARTERY BYPASS GRAFT    ? triple  bypass OCT 2013 in [place]  ? ELECTROPHYSIOLOGIC STUDY N/A 08/30/2014  ? Procedure: CARDIOVERSION;  Surgeon: [name], MD;  Location: [facility];  Service: Cardiovascular;  Laterality: N/A;  ? ELECTROPHYSIOLOGIC STUDY N/A 09/29/2014  ? Procedure: CARDIOVERSION;  Surgeon: [name], MD;  Location: [facility];  Service: Cardiovascular;  Laterality: N/A;  ? FLEXIBLE SIGMOIDOSCOPY N/A 12/16/2013  ? Procedure: FLEXIBLE SIGMOIDOSCOPY;  Surgeon: Milus Banister, MD;  Location: [facility];  Service: Endoscopy;  Laterality: N/A;  ? HEMORRHOID SURGERY N/A 03/09/2013  ? Procedure: HEMORRHOIDECTOMY;  Surgeon: Pedro Earls, MD;  [facility];  Service: General;  Laterality: N/A;  ? MOUTH SURGERY    ? ? ?Family Psychiatric History: denies ? ? ?Family History:  ?Family History  ?Problem Relation Age of Onset  ? Heart disease Father   ? Asthma Mother   ? Dementia Mother   ? Colon cancer Sister 39  ? ? ?Social History:   ?Social History  ? ?Socioeconomic History  ? Marital status: Divorced  ?  Spouse name: Not on file  ? Number of children: 2  ? Years of education: 34  ? Highest education level: Not on  file  ?Occupational History  ? Occupation: RetiredAnimal nutritionist   ?  Employer: RETIRED  ?Tobacco Use  ? Smoking status: Every Day  ?  Packs/day: 0.50  ?  Years: 50.00  ?  Pack years: 25.00  ?  Types: Cigarettes  ? Smokeless tobacco: Never  ? Tobacco comments:  ?  tobacco info given 01/07/14  ?Vaping Use  ? Vaping Use: Never used  ?Substance and Sexual Activity  ? Alcohol use: Yes  ?  Alcohol/week: 0.0 standard drinks  ?  Comment: 2-4 drinks per month, previously heavy  ? Drug use: No  ? Sexual activity: Never  ?Other Topics Concern  ? Not on file  ?Social History Narrative  ? 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 Lives in [place] with X wife.  ? Has moved homes x 3 in 2 months   ? States cannot always afford meds-See PI  ? Drinks 2 cups of coffee a day   ? ?Social Determinants of Health  ? ?Financial Resource Strain: Not on file  ?Food Insecurity: Not on file  ?Transportation Needs: Not on file  ?Physical Activity: Not on file  ?Stress: Not on file  ?Social Connections: Not on file  ? ? ? ? ?Allergies:  No Known Allergies ? ?Metabolic Disorder Labs: ?Lab Results  ?Component Value Date  ? HGBA1C 5.5 03/17/2011  ? MPG 111 03/17/2011  ? MPG 105 01/03/2011  ? ?No results found for: PROLACTIN ?Lab Results  ?Component Value Date  ? CHOL 129 01/04/2011  ? TRIG 86 01/04/2011  ? HDL 41 01/04/2011  ? CHOLHDL 3.1 01/04/2011  ? VLDL 17 01/04/2011  ? [place] 71 01/04/2011  ? ?Lab Results  ?Component Value Date  ? TSH 1.563 03/17/2011  ? ? ?Therapeutic Level Labs: ?No results found for: LITHIUM ?No results found for: CBMZ ?No results found for: VALPROATE ? ?Current Medications: ?Current Outpatient Medications  ?Medication Sig Dispense Refill  ? amiodarone (PACERONE) 200 MG tablet Take one tablet by mouth twice daily 60 tablet 3  ? apixaban (ELIQUIS) 5 MG TABS tablet Take 5 mg by mouth 2 (two) times daily.    ? metoprolol succinate (TOPROL-XL) 25 MG 24 hr tablet Take 25 mg by mouth daily.    ? rosuvastatin (CRESTOR) 10 MG tablet TAKE 1 TABLET BY MOUTH EVERY DAY    ? sildenafil (REVATIO) 20 MG tablet Take 40 mg by mouth daily.    ? zolpidem (AMBIEN CR) 12.5 MG CR tablet Take 1 tablet (12.5 mg total) by mouth at bedtime as needed for sleep. Refill when due 30 tablet 5  ? ?No current facility-administered medications for this visit.  ? ? ? ? ?Psychiatric Specialty Exam: ?Review of Systems  ?Cardiovascular:  Negative for chest pain.  ?Psychiatric/Behavioral:  Negative for depression.    ?Blood pressure 116/72, pulse 62, temperature 98.5 ?F (36.9 ?C), height '6\' 3"'$  (1.905 m), weight 212 lb (96.2 kg), SpO2 95 %.Body mass index is 26.5 kg/m?.  ?General Appearance:casual  ?Eye contact: Fair  ?Speech:  Normal Rate   ?Volume:  Normal  ?Mood:fair  ?Affect:  Congruent  ?Thought Process:  Goal Directed  ?Orientation:  Full (Time, Place, and Person)  ?Thought Content:  Logical  ?Suicidal Thoughts:  No  ?Homicidal Thoughts:  No  ?Memory:  Immediate;   Fair ?Recent;   Fair  ?Judgement:  Fair  ?Insight:  Fair  ?Psychomotor Activity:  Normal  ?Concentration:  Concentration: Fair and Attention Span: Fair  ?Recall:  [place]  ?Language: Fair  ?Akathisia:  No  ?Handed:  Right  ?AIMS (if indicated):  not done  ?Assets:  Desire for Improvement  ?ADL's:  Intact  ?Cognition: WNL  ?Sleep:  Fair  ? ?Screenings: ?PHQ2-9   ? ?Flowsheet Row Video Visit from 05/22/2020 in [place] Office Visit from 09/30/2012 in [place] Office Visit from 09/01/2012 in [place]  ?PHQ-2 Total Score 0 0 0  ? ?  ? ?[place] Office Visit from 06/12/2021 in [place] Office Visit from 11/14/2020 in [place] Video Visit from 05/22/2020 in [place]  ?C-SSRS RISK CATEGORY No Risk No Risk No Risk  ? ?  ? ? ?Assessment and Plan:  ? ?Prior documentation reviewed ? ?insomnia primary: fair on ambien, it keeps balance of sleep and tiredness next day ? ?Fu 14m?Time spent in office face-to-face: 15 minutes ? ?NMerian Capron MD ?4/4/202310:20 AM ?

## 2021-11-13 ENCOUNTER — Telehealth (HOSPITAL_COMMUNITY): Payer: Self-pay | Admitting: *Deleted

## 2021-11-13 MED ORDER — ZOLPIDEM TARTRATE ER 12.5 MG PO TBCR: 12.5000 mg | EXTENDED_RELEASE_TABLET | Freq: Every evening | ORAL | 5 refills | Status: DC | PRN

## 2021-11-13 NOTE — Addendum Note (Signed)
Addended by: Merian Capron on: 11/13/2021 09:14 AM   Modules accepted: Orders

## 2021-11-13 NOTE — Telephone Encounter (Signed)
Rx Refill Request -- Alternative Request- zolpidem (AMBIEN CR) 12.5 MG CR tablet Take 1 tablet (12.5 mg total) by mouth  at bedtime as needed for sleep

## 2021-12-18 ENCOUNTER — Encounter (HOSPITAL_COMMUNITY): Payer: Self-pay | Admitting: Psychiatry

## 2021-12-18 ENCOUNTER — Ambulatory Visit (INDEPENDENT_AMBULATORY_CARE_PROVIDER_SITE_OTHER): Payer: Medicare Other | Admitting: Psychiatry

## 2021-12-18 VITALS — BP 100/68 | HR 55 | Temp 98.6°F | Ht 75.0 in | Wt 207.0 lb

## 2021-12-18 DIAGNOSIS — F5101 Primary insomnia: Secondary | ICD-10-CM

## 2021-12-18 NOTE — Progress Notes (Signed)
[facility] Outpatient visit   Via Tele psych Patient Identification: [name] MRN:  [id number] Date of Evaluation:  12/18/2021 Referral Source: primary care Chief Complaint:   med review. insomnia Visit Diagnosis:    ICD-10-CM   1. Primary insomnia  F51.01             History of Present Illness: 76 years old currently living with his ex-wife has seen Dr. Roland Earl in [facility] psychiatry clinic. Referred by primary care for insomnia  Doing well with Lorrin Mais, gets some hours of sleep Wife injured herself but is recovering  Stress level is low  History of depression around 2015   Aggravating factor:CAD, atrial fibrillation Modifying factor: wife, woodwork  , meds      Past Psychiatric History: anxeity, insomnia  Previous Psychotropic Medications: Yes   Substance Abuse History in the last 12 months:  Yes.   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 03/21/2011 - 2D Echo: EF 25% to 30%. Diffuse hypokinesis.   Fatigue    H/O alcohol abuse    HEMORRHOIDS 01/17/2009   Hepatitis    hepatitis B-never treated-Titers were low   HLD (hyperlipidemia)    HTN (hypertension)    Insomnia    TAKES ATIVAN TO SLEEP   Left shoulder pain    Persistent atrial fibrillation ([place])    diagnosed 2010   Rhonchi     Past Surgical History:  Procedure Laterality Date   APPENDECTOMY  2019   CARDIAC CATHETERIZATION     COLON SURGERY  07/2012   COLON CANCER   COLONOSCOPY N/A 02/11/2013   Procedure: COLONOSCOPY;  Surgeon: Milus Banister, MD;  [facility];  Service: Endoscopy;  Laterality: N/A;   COLONOSCOPY WITH PROPOFOL N/A 05/02/2016   Procedure: COLONOSCOPY WITH PROPOFOL;  Surgeon: Milus Banister, MD;  [facility];  Service:  Endoscopy;  Laterality: N/A;   CORONARY ARTERY BYPASS GRAFT     triple  bypass OCT 2013 in [place] 08/30/2014   Procedure: CARDIOVERSION;  Surgeon: [name], MD;  Location: [facility];  Service: Cardiovascular;  Laterality: N/A;   ELECTROPHYSIOLOGIC STUDY N/A 09/29/2014   Procedure: CARDIOVERSION;  Surgeon: [name], MD;  Location: [facility];  Service: Cardiovascular;  Laterality: N/A;   FLEXIBLE SIGMOIDOSCOPY N/A 12/16/2013   Procedure: FLEXIBLE SIGMOIDOSCOPY;  Surgeon: Milus Banister, MD;  [facility];  Service: Endoscopy;  Laterality: N/A;   HEMORRHOID SURGERY N/A 03/09/2013   Procedure: HEMORRHOIDECTOMY;  Surgeon: Pedro Earls, MD;  [facility];  Service: General;  Laterality: N/A;   MOUTH SURGERY      Family Psychiatric History: denies   Family History:  Family History  Problem Relation Age of Onset   Heart disease Father    Asthma Mother    Dementia Mother    Colon cancer Sister 58    Social History:   Social History   Socioeconomic History   Marital status: Divorced    Spouse name: Not on file   Number of children: 2   Years of education: 12   Highest education level: Not on file  Occupational History   Occupation:  [employer]: RETIRED  Tobacco Use   Smoking status: Every Day    Packs/day: 0.50    Years: 50.00    Total pack years: 25.00    Types: Cigarettes   Smokeless tobacco: Never   Tobacco comments:    tobacco info given 01/07/14  Vaping Use   Vaping Use: Never used  Substance and Sexual Activity   Alcohol use: Yes    Alcohol/week: 0.0 standard drinks of alcohol    Comment: 2-4 drinks per month, previously heavy   Drug use: No   Sexual activity: Never  Other Topics Concern   Not on file  Social History Narrative   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   Has moved homes x 3 in 2 months   States cannot  always afford meds-See PI   Drinks 2 cups of coffee a day    Social Determinants of Health   Financial Resource Strain: Not on file  Food Insecurity: Not on file  Transportation Needs: Not on file  Physical Activity: Not on file  Stress: Not on file  Social Connections: Not on file      Allergies:  No Known Allergies  Metabolic Disorder Labs: Lab Results  Component Value Date   HGBA1C 5.5 03/17/2011   MPG 111 03/17/2011   MPG 105 01/03/2011   No results found for: "PROLACTIN" Lab Results  Component Value Date   CHOL 129 01/04/2011   TRIG 86 01/04/2011   HDL 41 01/04/2011   CHOLHDL 3.1 01/04/2011   VLDL 17 01/04/2011   LDLCALC 71 01/04/2011   Lab Results  Component Value Date   TSH 1.563 03/17/2011    Therapeutic Level Labs: No results found for: "LITHIUM" No results found for: "CBMZ" No results found for: "VALPROATE"  Current Medications: Current Outpatient Medications  Medication Sig Dispense Refill   amiodarone (PACERONE) 200 MG tablet Take one tablet by mouth twice daily 60 tablet 3   apixaban (ELIQUIS) 5 MG TABS tablet Take 5 mg by mouth 2 (two) times daily.     metoprolol succinate (TOPROL-XL) 25 MG 24 hr tablet Take 25 mg by mouth daily.     rosuvastatin (CRESTOR) 10 MG tablet TAKE 1 TABLET BY MOUTH EVERY DAY     sildenafil (REVATIO) 20 MG tablet Take 40 mg by mouth daily.     zolpidem (AMBIEN CR) 12.5 MG CR tablet Take 1 tablet (12.5 mg total) by mouth at bedtime as needed for sleep. Refill when due 30 tablet 5   No current facility-administered medications for this visit.      Psychiatric Specialty Exam: Review of Systems  Cardiovascular:  Negative for chest pain.  Psychiatric/Behavioral:  Negative for depression.     Blood pressure 100/68, pulse (!) 55, temperature 98.6 F (37 C), height '6\' 3"'$  (1.905 m), weight 207 lb (93.9 kg), SpO2 94 %.Body mass index is 25.87 kg/m.  General Appearance:casual  Eye contact: Fair  Speech:  Normal Rate   Volume:  Normal  Mood:fair  Affect:  Congruent  Thought Process:  Goal Directed  Orientation:  Full (Time, Place, and Person)  Thought Content:  Logical  Suicidal Thoughts:  No  Homicidal Thoughts:  No  Memory:  Immediate;   Fair Recent;   Fair  Judgement:  Fair  Insight:  Fair  Psychomotor Activity:  Normal  Concentration:  Concentration: Fair and Attention Span: Fair  Recall:  Fair  Fund of Knowledge:Fair  Language: Fair  Akathisia:  No  Handed:  Right  AIMS (if indicated):  not done  Assets:  Desire for Improvement  ADL's:  Intact  Cognition: WNL  Sleep:  Fair   Screenings: PHQ2-9    Flowsheet Row Video Visit from 05/22/2020 in [place] Office Visit from 09/30/2012 in [place] Office Visit from 09/01/2012 in [place]  PHQ-2 Total Score 0 0 0      [place] Office Visit from 12/18/2021 in [place] Office Visit from 06/12/2021 in [place] Office Visit from 11/14/2020 in [place] No Risk No Risk No Risk       Assessment and Plan:  Prior documentation reviewed  insomnia primary: doing fair, continue ambien, reviewed sleep hygiene, call for refills  Fu 46mTime spent in office face-to-face: 15 minutes   NMerian Capron MD 10/10/20238:34 AM

## 2022-04-29 ENCOUNTER — Telehealth (HOSPITAL_COMMUNITY): Payer: Self-pay | Admitting: *Deleted

## 2022-04-29 MED ORDER — ZOLPIDEM TARTRATE ER 12.5 MG PO TBCR: 12.5000 mg | EXTENDED_RELEASE_TABLET | Freq: Every evening | ORAL | 2 refills | Status: DC | PRN

## 2022-04-29 NOTE — Telephone Encounter (Signed)
Rx Refill Request -- zolpidem (AMBIEN CR) 12.5 MG CR tablet  [id number] [place],  - [id number]   Next appt  06/20/22 Last appt  12/18/21

## 2022-04-29 NOTE — Addendum Note (Signed)
Addended by: Merian Capron on: 04/29/2022 08:57 AM   Modules accepted: Orders

## 2022-06-20 ENCOUNTER — Encounter (HOSPITAL_COMMUNITY): Payer: Self-pay | Admitting: Psychiatry

## 2022-06-20 ENCOUNTER — Ambulatory Visit (INDEPENDENT_AMBULATORY_CARE_PROVIDER_SITE_OTHER): Payer: 59 | Admitting: Psychiatry

## 2022-06-20 VITALS — BP 117/82 | HR 61 | Temp 98.8°F | Ht 75.0 in | Wt 205.0 lb

## 2022-06-20 DIAGNOSIS — F5101 Primary insomnia: Secondary | ICD-10-CM | POA: Diagnosis not present

## 2022-06-20 MED ORDER — ZOLPIDEM TARTRATE ER 12.5 MG PO TBCR: 12.5000 mg | EXTENDED_RELEASE_TABLET | Freq: Every evening | ORAL | 2 refills | Status: DC | PRN

## 2022-06-20 NOTE — Progress Notes (Signed)
[facility]   Via Tele psych Patient Identification: Jeffrey PrimusRobert E Archer MRN:  [id number] Date of Evaluation:  06/20/2022 Referral Source: primary care Chief Complaint:   med review. insomnia Visit Diagnosis:    ICD-10-CM   1. Primary insomnia  F51.01             History of Present Illness: 77 years old currently living with his ex-wife has [facility] Referred by primary care for insomnia  Doing well on ambien, sleeps well No chest pain , headaches  Stress level is low  History of depression around 2015   Aggravating factor:CAD, atrial fibrillation Modifying factor: wife, wood work  , meds      Past Psychiatric History: anxeity, insomnia  Previous Psychotropic Medications: Yes   Substance Abuse History in the last 12 months:  Yes.   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 03/21/2011 - 2D Echo: EF 25% to 30%. Diffuse hypokinesis.   Fatigue    H/O alcohol abuse    HEMORRHOIDS 01/17/2009   Hepatitis    hepatitis B-never treated-Titers were low   HLD (hyperlipidemia)    HTN (hypertension)    Insomnia    TAKES ATIVAN TO SLEEP   Left shoulder pain    Persistent atrial fibrillation    diagnosed 2010   Rhonchi     Past Surgical History:  Procedure Laterality Date   APPENDECTOMY  2019   CARDIAC CATHETERIZATION     COLON SURGERY  07/2012   COLON CANCER   COLONOSCOPY N/A 02/11/2013   Procedure: COLONOSCOPY;  Surgeon: Rachael Feeaniel P Jacobs, MD;  [facility];  Service: Endoscopy;  Laterality: N/A;   COLONOSCOPY WITH PROPOFOL N/A 05/02/2016   Procedure: COLONOSCOPY WITH PROPOFOL;  Surgeon: Rachael Feeaniel P Jacobs, MD;  [facility];  Service: Endoscopy;  Laterality: N/A;   CORONARY ARTERY  BYPASS GRAFT     triple  bypass OCT 2013 in [place]   ELECTROPHYSIOLOGIC STUDY N/A 08/30/2014   Procedure: CARDIOVERSION;  Surgeon: Lamar BlinksBruce J Kowalski, MD;  Location: [facility];  Service: Cardiovascular;  Laterality: N/A;   ELECTROPHYSIOLOGIC STUDY N/A 09/29/2014   Procedure: CARDIOVERSION;  Surgeon: Lamar BlinksBruce J Kowalski, MD;  Location: [facility];  Service: Cardiovascular;  Laterality: N/A;   FLEXIBLE SIGMOIDOSCOPY N/A 12/16/2013   Procedure: FLEXIBLE SIGMOIDOSCOPY;  Surgeon: Rachael Feeaniel P Jacobs, MD;  [facility];  Service: Endoscopy;  Laterality: N/A;   HEMORRHOID SURGERY N/A 03/09/2013   Procedure: HEMORRHOIDECTOMY;  Surgeon: Valarie MerinoMatthew B Martin, MD;  [facility];  Service: General;  Laterality: N/A;   MOUTH SURGERY      Family Psychiatric History: denies   Family History:  Family History  Problem Relation Age of Onset   Heart disease Father    Asthma Mother    Dementia Mother    Colon cancer Sister [id number]    Social History:   Social History   Socioeconomic History   Marital status: Divorced    Spouse name: Not on file   Number of children: 2   Years of education: 12   Highest education level: Not on file  Occupational History   Occupation: RetiredDevelopment worker, international aid- Construction     Employer:  RETIRED  Tobacco Use   Smoking status: Every Day    Packs/day: 0.50    Years: 50.00    Additional pack years: 0.00    Total pack years: 25.00    Types: Cigarettes   Smokeless tobacco: Never  Vaping Use   Vaping Use: Never used  Substance and Sexual Activity   Alcohol use: Yes    Comment: 2-4 drinks per month, previously heavy   Drug use: No   Sexual activity: Never  Other Topics Concern   Not on file  Social History Narrative   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   Has moved homes x 3 in 2 months   States cannot always afford meds-See PI   Drinks 2 cups of coffee a day    Social Determinants of Health   Financial Resource  Strain: Not on file  Food Insecurity: Not on file  Transportation Needs: Not on file  Physical Activity: Not on file  Stress: Not on file  Social Connections: Not on file      Allergies:  No Known Allergies  Metabolic Disorder Labs: Lab Results  Component Value Date   HGBA1C 5.5 03/17/2011   MPG 111 03/17/2011   MPG 105 01/03/2011   No results found for: "PROLACTIN" Lab Results  Component Value Date   CHOL 129 01/04/2011   TRIG 86 01/04/2011   HDL 41 01/04/2011   CHOLHDL 3.1 01/04/2011   VLDL 17 01/04/2011   LDLCALC 71 01/04/2011   Lab Results  Component Value Date   TSH 1.563 03/17/2011    Therapeutic Level Labs: No results found for: "LITHIUM" No results found for: "CBMZ" No results found for: "VALPROATE"  Current Medications: Current Outpatient Medications  Medication Sig Dispense Refill   amiodarone (PACERONE) 200 MG tablet Take one tablet by mouth twice daily 60 tablet 3   apixaban (ELIQUIS) 5 MG TABS tablet Take 5 mg by mouth 2 (two) times daily.     metoprolol succinate (TOPROL-XL) 25 MG 24 hr tablet Take 25 mg by mouth daily.     rosuvastatin (CRESTOR) 10 MG tablet TAKE 1 TABLET BY MOUTH EVERY DAY     sildenafil (REVATIO) 20 MG tablet Take 40 mg by mouth daily.     zolpidem (AMBIEN CR) 12.5 MG CR tablet Take 1 tablet (12.5 mg total) by mouth at bedtime as needed for sleep. Refill when due 30 tablet 2   No current facility-administered medications for this visit.      Psychiatric Specialty Exam: Review of Systems  Cardiovascular:  Negative for chest pain.  Psychiatric/Behavioral:  Negative for depression.     Blood pressure 117/82, pulse 61, temperature 98.8 F (37.1 C), height 6\' 3"  (1.905 m), weight 205 lb (93 kg), SpO2 98 %.Body mass index is 25.62 kg/m.  General Appearance:casual  Eye contact: Fair  Speech:  Normal Rate  Volume:  Normal  Mood:fair  Affect:  Congruent  Thought Process:  Goal Directed  Orientation:  Full (Time, Place, and  Person)  Thought Content:  Logical  Suicidal Thoughts:  No  Homicidal Thoughts:  No  Memory:  Immediate;   Fair Recent;   Fair  Judgement:  Fair  Insight:  Fair  Psychomotor Activity:  Normal  Concentration:  Concentration: Fair and Attention Span: Fair  Recall:  [employer] of Knowledge:Fair  Language: Fair  Akathisia:  No  Handed:  Right  AIMS (if indicated):  not  done  Assets:  Desire for Improvement  ADL's:  Intact  Cognition: WNL  Sleep:  Fair   Screenings: PHQ2-9    Flowsheet Row Video Visit from [facility]/22/2020 in [facility] Office Visit from 09/30/2012 in [facility] Office Visit from 09/01/2012 in [facility]  PHQ-2 Total Score 0 0 0      Flowsheet Row Office Visit from 12/18/2021 in [facility] at [facility] Office Visit from 06/12/2021 in [facility] at [facility] Office Visit from 11/14/2020 in [facility] at [facility]  C-SSRS RISK CATEGORY No Risk No Risk No Risk       Assessment and Plan:  Prior documentation reviewed  insomnia primary: manageable , continue ambien, reviewed sleep hygiene  Fu 67m Time spent in office face-to-face: 15 minutes  Thresa Ross, MD 4/11/20248:42 AM

## 2022-11-26 ENCOUNTER — Telehealth (HOSPITAL_COMMUNITY): Payer: Self-pay | Admitting: *Deleted

## 2022-11-26 MED ORDER — ZOLPIDEM TARTRATE 10 MG PO TABS: 10.0000 mg | ORAL_TABLET | Freq: Every evening | ORAL | 0 refills | Status: DC | PRN

## 2022-11-26 NOTE — Telephone Encounter (Signed)
Patient came into office to inform: provider after Rx told him that  zolpidem (AMBIEN CR) 12.5 MG CR tablet  is on back order. And they  did tell him that the Regular Strength is available but the Extended Release is the back order for not sure how long. **DOES HAVE  ONLY 1 TABLET LEFT TO TAKE THIS EVENING & WILL BE OUT AFTERWARDS  PATIENT STATED HE ONLY SLEEPS  6 HRS ANYWAY SOMETIMES 7 HRS && DOESN'T THINK HE WOULD HAVE A PROBLEM TAKING THE   REGULAR STRENGTH  Next appt  12/24/22 Last appt   06/20/22

## 2022-11-26 NOTE — Addendum Note (Signed)
Addended by: Thresa Ross on: 11/26/2022 09:23 AM   Modules accepted: Orders

## 2022-12-24 ENCOUNTER — Ambulatory Visit (INDEPENDENT_AMBULATORY_CARE_PROVIDER_SITE_OTHER): Payer: 59 | Admitting: Psychiatry

## 2022-12-24 ENCOUNTER — Encounter (HOSPITAL_COMMUNITY): Payer: Self-pay | Admitting: Psychiatry

## 2022-12-24 VITALS — BP 140/87 | HR 55 | Ht 75.0 in | Wt 204.0 lb

## 2022-12-24 DIAGNOSIS — F5101 Primary insomnia: Secondary | ICD-10-CM

## 2022-12-24 MED ORDER — ZOLPIDEM TARTRATE 10 MG PO TABS: 10.0000 mg | ORAL_TABLET | Freq: Every evening | ORAL | 4 refills | Status: DC | PRN

## 2022-12-24 NOTE — Progress Notes (Signed)
[facility] Outpatient visit   Via Tele psych Patient Identification: KENTON FORTIN MRN:  [id number] Date of Evaluation:  12/24/2022 Referral Source: primary care Chief Complaint:   Chief Complaint   Follow-up   med review. insomnia Visit Diagnosis:    ICD-10-CM   1. Primary insomnia  F51.01             History of Present Illness: 77 years old currently living with his ex-wife has seen Dr. Clyde Lundborg in [facility] clinic. 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 03/21/2011 - 2D Echo: EF 25% to 30%. Diffuse hypokinesis.   Fatigue    H/O alcohol abuse    HEMORRHOIDS 01/17/2009   Hepatitis    hepatitis B-never treated-Titers were low   HLD (hyperlipidemia)    HTN (hypertension)    Insomnia    TAKES ATIVAN TO SLEEP   Left shoulder pain    Persistent atrial fibrillation (HCC)    diagnosed 2010   Rhonchi     Past Surgical History:  Procedure Laterality Date   APPENDECTOMY  2019   CARDIAC CATHETERIZATION     COLON SURGERY  07/2012   COLON CANCER   COLONOSCOPY N/A 02/11/2013   Procedure: COLONOSCOPY;  Surgeon: Rachael Fee, MD;  [facility];  Service: Endoscopy;  Laterality: N/A;   COLONOSCOPY WITH PROPOFOL N/A 05/02/2016   Procedure: COLONOSCOPY WITH  PROPOFOL;  Surgeon: Rachael Fee, MD;  [facility];  Service: Endoscopy;  Laterality: N/A;   CORONARY ARTERY BYPASS GRAFT     triple  bypass OCT 2013 in [place]   ELECTROPHYSIOLOGIC STUDY N/A 08/30/2014   Procedure: CARDIOVERSION;  Surgeon: Lamar Blinks, MD;  Location: [facility];  Service: Cardiovascular;  Laterality: N/A;   ELECTROPHYSIOLOGIC STUDY N/A 09/29/2014   Procedure: CARDIOVERSION;  Surgeon: Lamar Blinks, MD;  Location: [facility];  Service: Cardiovascular;  Laterality: N/A;   FLEXIBLE SIGMOIDOSCOPY N/A 12/16/2013   Procedure: FLEXIBLE SIGMOIDOSCOPY;  Surgeon: Rachael Fee, MD;  [facility];  Service: Endoscopy;  Laterality: N/A;   HEMORRHOID SURGERY N/A 03/09/2013   Procedure: HEMORRHOIDECTOMY;  Surgeon: Valarie Merino, MD;  [facility];  Service: General;  Laterality: N/A;   MOUTH SURGERY      Family Psychiatric History: denies   Family History:  Family History  Problem Relation Age of Onset   Heart disease Father    Asthma Mother    Dementia Mother    Colon cancer Sister 69    Social History:   Social History   Socioeconomic History   Marital status: Divorced    Spouse name: Not on file   Number of children: 2   Years of education:  12   Highest education level: Not on file  Occupational History   Occupation: RetiredDevelopment worker, international aid: RETIRED  Tobacco Use   Smoking status: Every Day    Current packs/day: 0.50    Average packs/day: 0.5 packs/day for 50.0 years (25.0 ttl pk-yrs)    Types: Cigarettes   Smokeless tobacco: Never  Vaping Use   Vaping status: Never Used  Substance and Sexual Activity   Alcohol use: Yes    Comment: 2-4 drinks per month, previously heavy   Drug use: No   Sexual activity: Never  Other Topics Concern   Not on file  Social History Narrative   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   Has moved homes x 3 in 2 months    States cannot always afford meds-See PI   Drinks 2 cups of coffee a day    Social Determinants of Health   Financial Resource Strain: Medium Risk (07/25/2022)   Received from [facility]   Overall Financial Resource Strain (CARDIA)    Difficulty of Paying Living Expenses: Somewhat hard  Food Insecurity: No Food Insecurity (07/25/2022)   Received from [facility]   Hunger Vital Sign    Worried About Running Out of Food in the Last Year: Never true    Ran Out of Food in the Last Year: Never true  Transportation Needs: No Transportation Needs (07/25/2022)   [facility] - [facility]    Lack of Transportation (Medical): No    Lack of Transportation (Non-Medical): No  Physical Activity: Sufficiently Active (07/25/2022)   [facility]   Exercise Vital Sign    Days of Exercise per Week: 6 days    Minutes of Exercise per Session: 90 min  Stress: No Stress Concern Present (07/25/2022)   Received from [facility] - Occupational Stress Questionnaire    Feeling of Stress : Not at all  Social Connections: Socially Integrated (07/25/2022)   Received from [facility]   Social Network    How would you rate your social network (family, work, friends)?: Good participation with social networks      Allergies:  No Known Allergies  Metabolic Disorder Labs: Lab Results  Component Value Date   HGBA1C 5.5 03/17/2011   MPG 111 03/17/2011   MPG 105 01/03/2011   No results found for: "PROLACTIN" Lab Results  Component Value Date   CHOL 129 01/04/2011   TRIG 86 01/04/2011   HDL 41 01/04/2011   CHOLHDL 3.1 01/04/2011   VLDL 17 01/04/2011   LDLCALC 71 01/04/2011   Lab Results  Component Value Date   TSH 1.563 03/17/2011    Therapeutic Level Labs: No results found for: "LITHIUM" No results found for: "CBMZ" No results found for: "VALPROATE"  Current Medications: Current Outpatient Medications   Medication Sig Dispense Refill   amiodarone (PACERONE) 200 MG tablet Take one tablet by mouth twice daily 60 tablet 3   apixaban (ELIQUIS) 5 MG TABS tablet Take 5 mg by mouth 2 (two) times daily.     metoprolol succinate (TOPROL-XL) 25 MG 24 hr tablet Take 25 mg by mouth daily.     rosuvastatin (CRESTOR) 10 MG tablet TAKE 1 TABLET BY MOUTH EVERY DAY     sildenafil (REVATIO) 20 MG tablet Take 40 mg by mouth daily.     zolpidem (AMBIEN) 10 MG tablet  Take 1 tablet (10 mg total) by mouth at bedtime as needed for sleep. 30 tablet 0   No current facility-administered medications for this visit.      Psychiatric Specialty Exam: Review of Systems  Cardiovascular:  Negative for chest pain.  Psychiatric/Behavioral:  Negative for depression.     Blood pressure (!) 140/87, pulse (!) 55, height 6\' 3"  (1.905 m), weight 204 lb (92.5 kg).Body mass index is 25.5 kg/m.  General Appearance:casual  Eye contact: Fair  Speech:  Normal Rate  Volume:  Normal  Mood:fair  Affect:  Congruent  Thought Process:  Goal Directed  Orientation:  Full (Time, Place, and Person)  Thought Content:  Logical  Suicidal Thoughts:  No  Homicidal Thoughts:  No  Memory:  Immediate;   Fair Recent;   Fair  Judgement:  Fair  Insight:  Fair  Psychomotor Activity:  Normal  Concentration:  Concentration: Fair and Attention Span: Fair  Recall:  [employer] of Knowledge:Fair  Language: Fair  Akathisia:  No  Handed:  Right  AIMS (if indicated):  not done  Assets:  Desire for Improvement  ADL's:  Intact  Cognition: WNL  Sleep:  Fair   Screenings: PHQ2-9    Flowsheet Row Video Visit from 05/22/2020 in [facility] at [facility] Office Visit from 09/30/2012 in [facility] Office Visit from 09/01/2012 in [facility]  PHQ-2 Total Score 0 0 0      Flowsheet Row Office Visit from 12/18/2021 in [facility]patient Behavioral Health at [facility] Office Visit from 06/12/2021 in [facility] at [facility] Office Visit from 11/14/2020 in [facility] Out[facility] at [facility]  C-SSRS RISK CATEGORY No Risk No Risk No Risk       Assessment and Plan:  Prior documentation reviewed  insomnia primary: stable on ambien continue 10 mg,    Time spent in office face-to-face:  15 minutes   Thresa Ross, MD 10/15/20248:36 AM

## 2023-01-21 ENCOUNTER — Telehealth (HOSPITAL_COMMUNITY): Payer: Self-pay | Admitting: *Deleted

## 2023-01-21 MED ORDER — ZOLPIDEM TARTRATE ER 12.5 MG PO TBCR: 12.5000 mg | EXTENDED_RELEASE_TABLET | Freq: Every evening | ORAL | 0 refills | Status: DC | PRN

## 2023-01-21 NOTE — Telephone Encounter (Signed)
Patient came in stated that -- [facility]  [id number] - [place], [place]  - [address] [phone]    Zolpidem (AMBIEN) 12 MG tablet -- BACK IN STOCK & STATED THAT THE Zolpidem (AMBIEN) 10 MG tablet HAS BEEN WORKING HS BEEN USING THIS LAST MONTH & @ 1 - 2 AM EVERY YOU  ARE UP NOT SLEEPING WELL @ ALL--  REQUESTED TO GO BACK ON Zolpidem (AMBIEN) 12 MG tablet

## 2023-01-21 NOTE — Telephone Encounter (Signed)
WOULD LIKE ambien 12.5mg  CR

## 2023-01-21 NOTE — Addendum Note (Signed)
Addended by: Thresa Ross on: 01/21/2023 10:54 AM   Modules accepted: Orders

## 2023-02-19 ENCOUNTER — Other Ambulatory Visit (HOSPITAL_COMMUNITY): Payer: Self-pay | Admitting: Psychiatry

## 2023-03-23 ENCOUNTER — Other Ambulatory Visit (HOSPITAL_COMMUNITY): Payer: Self-pay | Admitting: Psychiatry

## 2023-05-20 ENCOUNTER — Other Ambulatory Visit (HOSPITAL_COMMUNITY): Payer: Self-pay | Admitting: Psychiatry

## 2023-06-24 ENCOUNTER — Encounter (HOSPITAL_COMMUNITY): Payer: Self-pay | Admitting: Psychiatry

## 2023-06-24 ENCOUNTER — Ambulatory Visit (INDEPENDENT_AMBULATORY_CARE_PROVIDER_SITE_OTHER): Payer: 59 | Admitting: Psychiatry

## 2023-06-24 VITALS — BP 122/84 | HR 62 | Ht 77.0 in | Wt 235.0 lb

## 2023-06-24 DIAGNOSIS — F5101 Primary insomnia: Secondary | ICD-10-CM

## 2023-06-24 MED ORDER — ZOLPIDEM TARTRATE ER 12.5 MG PO TBCR: 12.5000 mg | EXTENDED_RELEASE_TABLET | Freq: Every evening | ORAL | 1 refills | Status: DC | PRN

## 2023-06-24 NOTE — Progress Notes (Signed)
 [facility] Outpatient visit   Via Tele psych Patient Identification: [name] MRN:  [id number] Date of Evaluation:  06/24/2023 Referral Source: primary care Chief Complaint:   Chief Complaint   Follow-up   med review. insomnia Visit Diagnosis:    ICD-10-CM   1. 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 Clyde Lundborg in [facility] psychiatry clinic. Referred by primary care for insomnia Doing well on ambien not on CR and likes the regular one  Sleeps well on meds ambien  Has been going thru providers and cardiology appointment with change of amiodarone to other meds, had cardioversion and on blood thinner Denies chest pain   Aggravating factor:CAD, atrial fibrillation Modifying factor: wife, wood work  , meds Severity  manageable      Past Psychiatric History: anxeity, insomnia  Previous Psychotropic Medications: Yes   Substance Abuse History in the last 12 months:  Yes.   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 03/21/2011 - 2D Echo: EF 25% to 30%. Diffuse hypokinesis.   Fatigue    H/O alcohol abuse    HEMORRHOIDS 01/17/2009   Hepatitis    hepatitis B-never treated-Titers were low   HLD (hyperlipidemia)    HTN (hypertension)    Insomnia    TAKES ATIVAN TO SLEEP   Left shoulder pain    Persistent atrial fibrillation (HCC)    diagnosed 2010   Rhonchi     Past Surgical History:  Procedure Laterality Date   APPENDECTOMY  2019   CARDIAC CATHETERIZATION     COLON SURGERY  07/2012   COLON CANCER   COLONOSCOPY N/A 02/11/2013   Procedure: COLONOSCOPY;  Surgeon: Rachael Fee, MD;  [facility]: [facility];  Service: Endoscopy;  Laterality: N/A;    COLONOSCOPY WITH PROPOFOL N/A 05/02/2016   Procedure: COLONOSCOPY WITH PROPOFOL;  Surgeon: Rachael Fee, MD;  [facility];  Service: Endoscopy;  Laterality: N/A;   CORONARY ARTERY BYPASS GRAFT     triple  bypass OCT 2013 in [place]   ELECTROPHYSIOLOGIC STUDY N/A 08/30/2014   Procedure: CARDIOVERSION;  Surgeon: Lamar Blinks, MD;  Location: [facility];  Service: Cardiovascular;  Laterality: N/A;   ELECTROPHYSIOLOGIC STUDY N/A 09/29/2014   Procedure: CARDIOVERSION;  Surgeon: Lamar Blinks, MD;  Location: [facility];  Service: Cardiovascular;  Laterality: N/A;   FLEXIBLE SIGMOIDOSCOPY N/A 12/16/2013   Procedure: FLEXIBLE SIGMOIDOSCOPY;  Surgeon: Rachael Fee, MD;  [facility];  Service: Endoscopy;  Laterality: N/A;   HEMORRHOID SURGERY N/A 03/09/2013   Procedure: HEMORRHOIDECTOMY;  Surgeon: Valarie Merino, MD;  [facility];  Service: General;  Laterality: N/A;   MOUTH SURGERY      Family Psychiatric History: denies   Family History:  Family History  Problem Relation Age of Onset   Heart disease Father    Asthma Mother    Dementia Mother    Colon cancer Sister 65    Social History:   Social History   Socioeconomic History   Marital status: Divorced    Spouse name: Not on  file   Number of children: 2   Years of education: 12   Highest education level: Not on file  Occupational History   Occupation: RetiredDevelopment worker, international aid: RETIRED  Tobacco Use   Smoking status: Every Day    Current packs/day: 0.50    Average packs/day: 0.5 packs/day for 50.0 years (25.0 ttl pk-yrs)    Types: Cigarettes   Smokeless tobacco: Never  Vaping Use   Vaping status: Never Used  Substance and Sexual Activity   Alcohol use: Yes    Comment: 2-4 drinks per month, previously heavy   Drug use: No   Sexual activity: Never  Other Topics Concern   Not on file  Social History Narrative   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   Has moved homes x 3 in 2 months   States cannot always afford meds-See PI   Drinks 2 cups of coffee a day    Social Drivers of Corporate investment banker Strain: Low Risk  (06/13/2023)   Received from [facility]   Overall Financial Resource Strain (CARDIA)    Difficulty of Paying Living Expenses: Not very hard  Food Insecurity: No Food Insecurity (06/13/2023)   Received from [facility]   Hunger Vital Sign    Worried About Running Out of Food in the Last Year: Never true    Ran Out of Food in the Last Year: Never true  Transportation Needs: No Transportation Needs (06/13/2023)   [facility] - Transportation    Lack of Transportation (Medical): No    Lack of Transportation (Non-Medical): No  Physical Activity: Insufficiently Active (06/13/2023)   Received from [facility]   Exercise Vital Sign    Days of Exercise per Week: 3 days    Minutes of Exercise per Session: 20 min  Stress: Stress Concern Present (06/13/2023)   Received from [facility] - Occupational Stress Questionnaire    Feeling of Stress : To some extent  Social Connections: Moderately Integrated (06/13/2023)   Received from [facility]   Social Network    How would you rate your social network (family, work, friends)?: Adequate participation with social networks      Allergies:  No Known Allergies  Metabolic Disorder Labs: Lab Results  Component Value Date   HGBA1C 5.5 03/17/2011   MPG 111 03/17/2011   MPG 105 01/03/2011   No results found for: "PROLACTIN" Lab Results  Component Value Date   CHOL 129 01/04/2011   TRIG 86 01/04/2011   HDL 41 01/04/2011   CHOLHDL 3.1 01/04/2011   VLDL 17 01/04/2011   LDLCALC 71 01/04/2011   Lab Results  Component Value Date   TSH 1.563 03/17/2011    Therapeutic Level Labs: No results found for: "LITHIUM" No results found for: "CBMZ" No results found for:  "VALPROATE"  Current Medications: Current Outpatient Medications  Medication Sig Dispense Refill   amiodarone (PACERONE) 200 MG tablet Take one tablet by mouth twice daily 60 tablet 3   apixaban (ELIQUIS) 5 MG TABS tablet Take 5 mg by mouth 2 (two) times daily.     metoprolol succinate (TOPROL-XL) 25 MG 24 hr tablet Take 25 mg by mouth daily.     rosuvastatin (CRESTOR) 10 MG tablet TAKE 1 TABLET BY MOUTH EVERY DAY     sildenafil (REVATIO) 20 MG tablet Take 40  mg by mouth daily.     zolpidem (AMBIEN CR) 12.5 MG CR tablet TAKE 1 TABLET BY MOUTH EVERY NIGHT AT BEDTIME AS NEEDED FOR SLEEP 30 tablet 1   zolpidem (AMBIEN) 10 MG tablet Take 1 tablet (10 mg total) by mouth at bedtime as needed for sleep. 30 tablet 4   No current facility-administered medications for this visit.      Psychiatric Specialty Exam: Review of Systems  Cardiovascular:  Negative for chest pain.  Psychiatric/Behavioral:  Negative for depression.     Blood pressure 122/84, pulse 62, height 6\' 5"  (1.956 m), weight 217 lb (98.4 kg).Body mass index is 25.73 kg/m.  General Appearance:casual  Eye contact: Fair  Speech:  Normal Rate  Volume:  Normal  Mood:fair  Affect:  Congruent  Thought Process:  Goal Directed  Orientation:  Full (Time, Place, and Person)  Thought Content:  Logical  Suicidal Thoughts:  No  Homicidal Thoughts:  No  Memory:  Immediate;   Fair Recent;   Fair  Judgement:  Fair  Insight:  Fair  Psychomotor Activity:  Normal  Concentration:  Concentration: Fair and Attention Span: Fair  Recall:  [employer] of Knowledge:Fair  Language: Fair  Akathisia:  No  Handed:  Right  AIMS (if indicated):  not done  Assets:  Desire for Improvement  ADL's:  Intact  Cognition: WNL  Sleep:  Fair   Screenings: PHQ2-9    Flowsheet Row Video Visit from 05/22/2020 in [facility] Office Visit from 09/30/2012 in [facility] [place] - A Dept Of  [place]. [facility] Office Visit from 09/01/2012 in [facility] [place] - A Dept Of Tommas Fragmin. [facility]  PHQ-2 Total Score 0 0 0      Flowsheet Row Office Visit from 12/18/2021 in [facility] at [facility] Office Visit from 06/12/2021 in [facility] at [facility] Office Visit from 11/14/2020 in [facility] at [facility]  C-SSRS RISK CATEGORY No Risk No Risk No Risk       Assessment and Plan:   Primary documentation reviewed   insomnia primary: stable in regard to sleep with ambien CR 12.5 mg , reviewed sleep hygiene   Time spent in office face-to-face:  15 minutes   Wray Heady, MD 4/15/20258:34 AM

## 2023-09-20 ENCOUNTER — Other Ambulatory Visit (HOSPITAL_COMMUNITY): Payer: Self-pay | Admitting: Psychiatry

## 2023-11-18 ENCOUNTER — Other Ambulatory Visit (HOSPITAL_COMMUNITY): Payer: Self-pay | Admitting: Psychiatry

## 2023-12-16 ENCOUNTER — Ambulatory Visit (HOSPITAL_COMMUNITY): Admitting: Psychiatry

## 2023-12-16 ENCOUNTER — Encounter (HOSPITAL_COMMUNITY): Payer: Self-pay | Admitting: Psychiatry

## 2023-12-16 VITALS — BP 111/80 | HR 67 | Ht 77.0 in | Wt 215.2 lb

## 2023-12-16 DIAGNOSIS — F5101 Primary insomnia: Secondary | ICD-10-CM

## 2023-12-16 MED ORDER — ZOLPIDEM TARTRATE ER 12.5 MG PO TBCR: 12.5000 mg | EXTENDED_RELEASE_TABLET | Freq: Every evening | ORAL | 1 refills | Status: DC | PRN

## 2023-12-16 NOTE — Progress Notes (Signed)
 [facility] Outpatient visit   Via Tele psych Patient Identification: [name] MRN:  [id number] Date of Evaluation:  12/16/2023 Referral Source: primary care Chief Complaint:    med review. insomnia Visit Diagnosis:    ICD-10-CM   1. 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 Audree in [facility] psychiatry clinic. 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 03/21/2011 - 2D Echo: EF 25% to 30%. Diffuse hypokinesis.   Fatigue    H/O alcohol abuse    HEMORRHOIDS 01/17/2009   Hepatitis    hepatitis B-never treated-Titers were low   HLD (hyperlipidemia)    HTN (hypertension)    Insomnia    TAKES ATIVAN  TO SLEEP   Left shoulder pain    Persistent atrial fibrillation (HCC)    diagnosed 2010   Rhonchi     Past Surgical History:  Procedure Laterality Date   APPENDECTOMY  2019   CARDIAC CATHETERIZATION     COLON SURGERY  07/2012   COLON CANCER   COLONOSCOPY N/A 02/11/2013   Procedure: COLONOSCOPY;   Surgeon: Toribio SHAUNNA Cedar, MD;  [facility];  Service: Endoscopy;  Laterality: N/A;   COLONOSCOPY WITH PROPOFOL  N/A 05/02/2016   Procedure: COLONOSCOPY WITH PROPOFOL ;  Surgeon: Toribio SHAUNNA Cedar, MD;  [facility];  Service: Endoscopy;  Laterality: N/A;   CORONARY ARTERY BYPASS GRAFT     triple  bypass OCT 2013 in [place]   ELECTROPHYSIOLOGIC STUDY N/A 08/30/2014   Procedure: CARDIOVERSION;  Surgeon: Wolm JINNY Rhyme, MD;  Location: [facility];  Service: Cardiovascular;  Laterality: N/A;   ELECTROPHYSIOLOGIC STUDY N/A 09/29/2014   Procedure: CARDIOVERSION;  Surgeon: Wolm JINNY Rhyme, MD;  Location: [facility];  Service: Cardiovascular;  Laterality: N/A;   FLEXIBLE SIGMOIDOSCOPY N/A 12/16/2013   Procedure: FLEXIBLE SIGMOIDOSCOPY;  Surgeon: Toribio SHAUNNA Cedar, MD;  [facility];  Service: Endoscopy;  Laterality: N/A;   HEMORRHOID SURGERY N/A 03/09/2013   Procedure: HEMORRHOIDECTOMY;  Surgeon: Donnice KATHEE Lunger, MD;  [facility];  Service: General;  Laterality: N/A;   MOUTH SURGERY      Family Psychiatric History: denies   Family History:  Family History  Problem Relation Age of Onset   Heart disease Father    Asthma Mother    Dementia Mother    Colon cancer Sister 51    Social History:  Social History   Socioeconomic History   Marital status: Divorced    Spouse name: Not on file   Number of children: 2   Years of education: 12   Highest education level: Not on file  Occupational History   Occupation: RetiredDevelopment worker, international aid: RETIRED  Tobacco Use   Smoking status: Every Day    Current packs/day: 0.50    Average packs/day: 0.5 packs/day for 50.0 years (25.0 ttl pk-yrs)    Types: Cigarettes   Smokeless tobacco: Never  Vaping Use   Vaping status: Never Used  Substance and Sexual Activity   Alcohol use: Yes    Comment: 2-4 drinks per month, previously heavy   Drug use: No   Sexual activity: Never  Other Topics Concern   Not on file   Social History Narrative   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   Has moved homes x 3 in 2 months   States cannot always afford meds-See PI   Drinks 2 cups of coffee a day    Social Drivers of Corporate investment banker Strain: Low Risk  (06/13/2023)   Received from [facility]   Overall Financial Resource Strain (CARDIA)    Difficulty of Paying Living Expenses: Not very hard  Food Insecurity: No Food Insecurity (07/11/2023)   Received from [facility]   Hunger Vital Sign    Within the past 12 months, you worried that your food would run out before you got the money to buy more.: Never true    Within the past 12 months, the food you bought just didn't last and you didn't have money to get more.: Never true  Transportation Needs: No Transportation Needs (07/11/2023)   [facility] - Transportation    Lack of Transportation (Medical): No    Lack of Transportation (Non-Medical): No  Physical Activity: Insufficiently Active (06/13/2023)   Received from [facility]   Exercise Vital Sign    On average, how many days per week do you engage in moderate to strenuous exercise (like a brisk walk)?: 3 days    On average, how many minutes do you engage in exercise at this level?: 20 min  Stress: No Stress Concern Present (07/11/2023)   Received from [facility] - Occupational Stress Questionnaire    Feeling of Stress : Not at all  Recent Concern: Stress - Stress Concern Present (06/13/2023)   Received from [facility] Occupational [facility] - Occupational Stress Questionnaire    Feeling of Stress : To some extent  Social Connections: Moderately Integrated (06/13/2023)   [facility] from [facility]   Social Network    How would you rate your social network (family, work, friends)?: Adequate participation with social networks      Allergies:  No  Known Allergies  Metabolic Disorder Labs: Lab Results  Component Value Date   HGBA1C 5.5 03/17/2011   MPG 111 03/17/2011   MPG 105 01/03/2011   No results found for: PROLACTIN Lab Results  Component Value Date   CHOL 129 01/04/2011   TRIG 86 01/04/2011   HDL 41 01/04/2011   CHOLHDL 3.1 01/04/2011   VLDL 17 01/04/2011   LDLCALC 71 01/04/2011   Lab Results  Component Value Date   TSH 1.563 03/17/2011    Therapeutic Level Labs: No results found for: LITHIUM  No results found for: CBMZ No results found for: VALPROATE  Current Medications: Current Outpatient Medications  Medication Sig Dispense Refill   apixaban (ELIQUIS) 5 MG TABS tablet Take 5 mg by mouth 2 (two) times daily.     diltiazem  (CARDIZEM  CD) 180 MG 24 hr capsule Take 180 mg by mouth daily.     metoprolol  succinate (TOPROL -XL) 25 MG 24 hr tablet Take 25 mg by mouth daily.     rosuvastatin (CRESTOR) 10 MG tablet TAKE 1 TABLET BY MOUTH EVERY DAY     sildenafil (REVATIO) 20 MG tablet Take 40 mg by mouth daily.     amiodarone  (PACERONE ) 200 MG tablet Take one tablet by mouth twice daily (Patient not taking: Reported on 12/16/2023) 60 tablet 3   zolpidem  (AMBIEN  CR) 12.5 MG CR tablet Take 1 tablet (12.5 mg total) by mouth at bedtime as needed. for sleep 30 tablet 1   No current facility-administered medications for this visit.      Psychiatric Specialty Exam: Review of Systems  Cardiovascular:  Negative for chest pain.  Psychiatric/Behavioral:  Negative for depression.     Blood pressure 111/80, pulse 67, height 6' 5 (1.956 m), weight 215 lb 3.2 oz (97.6 kg), SpO2 98%.Body mass index is 25.52 kg/m.  General Appearance:casual  Eye contact: Fair  Speech:  Normal Rate  Volume:  Normal  Mood:fair  Affect:  Congruent  Thought Process:  Goal Directed  Orientation:  Full (Time, Place, and Person)  Thought Content:  Logical  Suicidal Thoughts:  No  Homicidal Thoughts:  No  Memory:  Immediate;    Fair Recent;   Fair  Judgement:  Fair  Insight:  Fair  Psychomotor Activity:  Normal  Concentration:  Concentration: Fair and Attention Span: Fair  Recall:  [employer] of Knowledge:Fair  Language: Fair  Akathisia:  No  Handed:  Right  AIMS (if indicated):  not done  Assets:  Desire for Improvement  ADL's:  Intact  Cognition: WNL  Sleep:  Fair   Screenings: GAD-7    Flowsheet Row Office Visit from 12/16/2023 in [place] [facility] at [facility]  Total GAD-7 Score 0   PHQ2-9    Flowsheet Row Office Visit from 12/16/2023 in [place] [facility] at [facility] Video Visit from 05/22/2020 in [facility] Outpatient [facility]al Health at [facility] Office Visit from 09/30/2012 in [facility] [place] - A Dept Of [place]. [facility] Office Visit from 09/01/2012 in [facility] [place] - A Dept Of [place]. [facility]  PHQ-2 Total Score 0 0 0 0  PHQ-9 Total Score 0 -- -- --   Flowsheet Row Office Visit from 12/18/2021 in [place] [facility] at [facility] Office Visit from 06/12/2021 in [facility] at [facility] Office Visit from 11/14/2020 in [facility] Outpatient Behavioral [facility] at [facility]  C-SSRS RISK CATEGORY No Risk No Risk No Risk    Assessment and Plan:   Prior documentation reviewed   insomnia primary: stable continue ambien  and sleep hyiene  Time spent in office face-to-face:  15 minutes  Fu 6 m. Reflls sent , reviewed meds  Jackey Flight, MD 10/7/20258:45 AM

## 2023-12-23 ENCOUNTER — Ambulatory Visit (HOSPITAL_COMMUNITY): Admitting: Psychiatry

## 2024-03-15 ENCOUNTER — Other Ambulatory Visit (HOSPITAL_COMMUNITY): Payer: Self-pay | Admitting: Psychiatry

## 2024-06-15 ENCOUNTER — Ambulatory Visit (HOSPITAL_COMMUNITY): Admitting: Psychiatry
